# Patient Record
Sex: Female | Born: 1970
Health system: Southern US, Community
[De-identification: ages and names within clinical notes are randomized; demographics above are authoritative.]

## PROBLEM LIST (undated history)

## (undated) DIAGNOSIS — F32A Depression, unspecified: Secondary | ICD-10-CM

## (undated) DIAGNOSIS — F329 Major depressive disorder, single episode, unspecified: Secondary | ICD-10-CM

## (undated) DIAGNOSIS — I10 Essential (primary) hypertension: Secondary | ICD-10-CM

## (undated) DIAGNOSIS — J45909 Unspecified asthma, uncomplicated: Secondary | ICD-10-CM

## (undated) DIAGNOSIS — F419 Anxiety disorder, unspecified: Secondary | ICD-10-CM

## (undated) HISTORY — DX: Essential (primary) hypertension: I10

## (undated) HISTORY — DX: Major depressive disorder, single episode, unspecified: F32.9

## (undated) HISTORY — DX: Anxiety disorder, unspecified: F41.9

## (undated) HISTORY — DX: Depression, unspecified: F32.A

## (undated) HISTORY — DX: Unspecified asthma, uncomplicated: J45.909

## (undated) HISTORY — PX: TONSILLECTOMY: SUR1361

---

## 2005-02-11 DIAGNOSIS — O039 Complete or unspecified spontaneous abortion without complication: Secondary | ICD-10-CM | POA: Insufficient documentation

## 2006-01-08 ENCOUNTER — Emergency Department (HOSPITAL_COMMUNITY): Admission: EM | Admit: 2006-01-08 | Discharge: 2006-01-08 | Payer: Self-pay | Admitting: Emergency Medicine

## 2006-03-11 ENCOUNTER — Ambulatory Visit: Payer: Self-pay | Admitting: Family Medicine

## 2006-03-25 ENCOUNTER — Ambulatory Visit: Payer: Self-pay | Admitting: Family Medicine

## 2006-03-26 ENCOUNTER — Encounter: Payer: Self-pay | Admitting: Cardiology

## 2006-03-26 ENCOUNTER — Ambulatory Visit: Payer: Self-pay

## 2006-04-08 ENCOUNTER — Ambulatory Visit: Payer: Self-pay | Admitting: Family Medicine

## 2006-04-22 ENCOUNTER — Ambulatory Visit: Payer: Self-pay | Admitting: Family Medicine

## 2006-08-26 ENCOUNTER — Ambulatory Visit: Payer: Self-pay | Admitting: Family Medicine

## 2006-09-23 ENCOUNTER — Ambulatory Visit: Payer: Self-pay | Admitting: Family Medicine

## 2006-11-03 DIAGNOSIS — I10 Essential (primary) hypertension: Secondary | ICD-10-CM

## 2006-11-03 DIAGNOSIS — A389 Scarlet fever, uncomplicated: Secondary | ICD-10-CM | POA: Insufficient documentation

## 2006-11-03 DIAGNOSIS — F329 Major depressive disorder, single episode, unspecified: Secondary | ICD-10-CM

## 2006-12-09 ENCOUNTER — Ambulatory Visit: Payer: Self-pay | Admitting: Family Medicine

## 2006-12-09 ENCOUNTER — Encounter: Payer: Self-pay | Admitting: Family Medicine

## 2006-12-09 ENCOUNTER — Other Ambulatory Visit: Admission: RE | Admit: 2006-12-09 | Discharge: 2006-12-09 | Payer: Self-pay | Admitting: Family Medicine

## 2006-12-09 LAB — CONVERTED CEMR LAB
CO2: 25 meq/L (ref 19–32)
Calcium: 9.4 mg/dL (ref 8.4–10.5)
Chloride: 104 meq/L (ref 96–112)
Creatinine, Ser: 0.6 mg/dL (ref 0.4–1.2)
GFR calc Af Amer: 148 mL/min
GFR calc non Af Amer: 122 mL/min
Potassium: 3.6 meq/L (ref 3.5–5.1)
TSH: 2.35 microintl units/mL (ref 0.35–5.50)
Testosterone: 58.87 ng/dL (ref 10.00–70.0)

## 2006-12-16 ENCOUNTER — Encounter (INDEPENDENT_AMBULATORY_CARE_PROVIDER_SITE_OTHER): Payer: Self-pay | Admitting: *Deleted

## 2007-01-04 ENCOUNTER — Ambulatory Visit: Payer: Self-pay | Admitting: Family Medicine

## 2007-01-06 LAB — CONVERTED CEMR LAB
Glucose, 1 Hour GTT: 229 mg/dL — ABNORMAL HIGH (ref 120–170)
Glucose, 2 hour: 257 mg/dL — ABNORMAL HIGH (ref 70–139)

## 2007-01-08 ENCOUNTER — Ambulatory Visit: Payer: Self-pay | Admitting: Family Medicine

## 2007-01-08 DIAGNOSIS — E119 Type 2 diabetes mellitus without complications: Secondary | ICD-10-CM | POA: Insufficient documentation

## 2007-01-08 DIAGNOSIS — E669 Obesity, unspecified: Secondary | ICD-10-CM | POA: Insufficient documentation

## 2007-02-01 ENCOUNTER — Encounter: Admission: RE | Admit: 2007-02-01 | Discharge: 2007-05-02 | Payer: Self-pay | Admitting: Family Medicine

## 2007-02-01 ENCOUNTER — Encounter: Payer: Self-pay | Admitting: Family Medicine

## 2007-03-01 ENCOUNTER — Encounter: Payer: Self-pay | Admitting: Family Medicine

## 2007-04-29 ENCOUNTER — Ambulatory Visit: Payer: Self-pay | Admitting: Family Medicine

## 2007-05-03 LAB — CONVERTED CEMR LAB
ALT: 16 units/L (ref 0–35)
Albumin: 3.7 g/dL (ref 3.5–5.2)
Bilirubin, Direct: 0.1 mg/dL (ref 0.0–0.3)
CO2: 28 meq/L (ref 19–32)
Calcium: 9.2 mg/dL (ref 8.4–10.5)
Chloride: 107 meq/L (ref 96–112)
Creatinine, Ser: 0.4 mg/dL (ref 0.4–1.2)
GFR calc Af Amer: 236 mL/min
GFR calc non Af Amer: 195 mL/min
Potassium: 3.8 meq/L (ref 3.5–5.1)
Sodium: 140 meq/L (ref 135–145)
Total Bilirubin: 0.7 mg/dL (ref 0.3–1.2)
Total Protein: 7 g/dL (ref 6.0–8.3)

## 2007-05-29 ENCOUNTER — Ambulatory Visit: Payer: Self-pay | Admitting: Family Medicine

## 2007-05-29 DIAGNOSIS — S335XXA Sprain of ligaments of lumbar spine, initial encounter: Secondary | ICD-10-CM

## 2007-05-29 DIAGNOSIS — M546 Pain in thoracic spine: Secondary | ICD-10-CM | POA: Insufficient documentation

## 2007-05-31 ENCOUNTER — Ambulatory Visit: Payer: Self-pay | Admitting: Family Medicine

## 2007-06-01 ENCOUNTER — Encounter (INDEPENDENT_AMBULATORY_CARE_PROVIDER_SITE_OTHER): Payer: Self-pay | Admitting: *Deleted

## 2007-06-03 ENCOUNTER — Telehealth: Payer: Self-pay | Admitting: Family Medicine

## 2007-06-11 ENCOUNTER — Encounter: Admission: RE | Admit: 2007-06-11 | Discharge: 2007-06-11 | Payer: Self-pay | Admitting: Family Medicine

## 2007-06-30 ENCOUNTER — Encounter: Payer: Self-pay | Admitting: Family Medicine

## 2007-10-19 ENCOUNTER — Telehealth (INDEPENDENT_AMBULATORY_CARE_PROVIDER_SITE_OTHER): Payer: Self-pay | Admitting: *Deleted

## 2007-11-03 ENCOUNTER — Ambulatory Visit: Payer: Self-pay | Admitting: Family Medicine

## 2007-11-03 DIAGNOSIS — F411 Generalized anxiety disorder: Secondary | ICD-10-CM | POA: Insufficient documentation

## 2007-11-17 ENCOUNTER — Ambulatory Visit: Payer: Self-pay | Admitting: *Deleted

## 2007-11-24 ENCOUNTER — Ambulatory Visit: Payer: Self-pay | Admitting: *Deleted

## 2007-12-15 ENCOUNTER — Ambulatory Visit: Payer: Self-pay | Admitting: *Deleted

## 2007-12-22 ENCOUNTER — Encounter: Payer: Self-pay | Admitting: Family Medicine

## 2007-12-22 ENCOUNTER — Ambulatory Visit: Payer: Self-pay | Admitting: *Deleted

## 2008-01-12 ENCOUNTER — Ambulatory Visit: Payer: Self-pay | Admitting: *Deleted

## 2008-01-13 ENCOUNTER — Telehealth (INDEPENDENT_AMBULATORY_CARE_PROVIDER_SITE_OTHER): Payer: Self-pay | Admitting: *Deleted

## 2008-02-02 ENCOUNTER — Ambulatory Visit: Payer: Self-pay | Admitting: *Deleted

## 2008-02-09 ENCOUNTER — Ambulatory Visit: Payer: Self-pay | Admitting: *Deleted

## 2008-02-16 ENCOUNTER — Ambulatory Visit: Payer: Self-pay | Admitting: *Deleted

## 2008-02-23 ENCOUNTER — Telehealth: Payer: Self-pay | Admitting: Family Medicine

## 2008-02-23 ENCOUNTER — Ambulatory Visit: Payer: Self-pay | Admitting: *Deleted

## 2008-03-01 ENCOUNTER — Telehealth: Payer: Self-pay | Admitting: Family Medicine

## 2008-03-02 ENCOUNTER — Telehealth (INDEPENDENT_AMBULATORY_CARE_PROVIDER_SITE_OTHER): Payer: Self-pay | Admitting: *Deleted

## 2008-03-08 ENCOUNTER — Ambulatory Visit: Payer: Self-pay | Admitting: *Deleted

## 2008-03-22 ENCOUNTER — Ambulatory Visit: Payer: Self-pay | Admitting: *Deleted

## 2008-03-29 ENCOUNTER — Ambulatory Visit: Payer: Self-pay | Admitting: *Deleted

## 2008-04-05 ENCOUNTER — Ambulatory Visit: Payer: Self-pay | Admitting: *Deleted

## 2008-04-19 ENCOUNTER — Encounter: Payer: Self-pay | Admitting: Family Medicine

## 2008-04-27 ENCOUNTER — Encounter: Admission: RE | Admit: 2008-04-27 | Discharge: 2008-04-27 | Payer: Self-pay | Admitting: Neurosurgery

## 2008-04-28 ENCOUNTER — Ambulatory Visit: Payer: Self-pay | Admitting: *Deleted

## 2008-05-03 ENCOUNTER — Ambulatory Visit: Payer: Self-pay | Admitting: *Deleted

## 2008-05-10 ENCOUNTER — Ambulatory Visit: Payer: Self-pay | Admitting: *Deleted

## 2008-05-17 ENCOUNTER — Ambulatory Visit: Payer: Self-pay | Admitting: *Deleted

## 2008-05-18 ENCOUNTER — Encounter: Payer: Self-pay | Admitting: Family Medicine

## 2008-05-31 ENCOUNTER — Ambulatory Visit: Payer: Self-pay | Admitting: *Deleted

## 2008-06-06 ENCOUNTER — Telehealth: Payer: Self-pay | Admitting: Family Medicine

## 2008-06-07 ENCOUNTER — Ambulatory Visit: Payer: Self-pay | Admitting: *Deleted

## 2008-06-14 ENCOUNTER — Ambulatory Visit: Payer: Self-pay | Admitting: *Deleted

## 2008-06-21 ENCOUNTER — Ambulatory Visit: Payer: Self-pay | Admitting: *Deleted

## 2008-07-12 ENCOUNTER — Ambulatory Visit: Payer: Self-pay | Admitting: *Deleted

## 2008-07-19 ENCOUNTER — Ambulatory Visit: Payer: Self-pay | Admitting: *Deleted

## 2008-07-26 ENCOUNTER — Ambulatory Visit: Payer: Self-pay | Admitting: *Deleted

## 2008-08-02 ENCOUNTER — Ambulatory Visit: Payer: Self-pay | Admitting: *Deleted

## 2008-08-08 ENCOUNTER — Emergency Department (HOSPITAL_COMMUNITY): Admission: EM | Admit: 2008-08-08 | Discharge: 2008-08-09 | Payer: Self-pay | Admitting: Emergency Medicine

## 2008-08-16 ENCOUNTER — Ambulatory Visit: Payer: Self-pay | Admitting: *Deleted

## 2008-08-30 ENCOUNTER — Ambulatory Visit: Payer: Self-pay | Admitting: *Deleted

## 2008-09-04 ENCOUNTER — Telehealth: Payer: Self-pay | Admitting: Family Medicine

## 2008-09-06 ENCOUNTER — Ambulatory Visit: Payer: Self-pay | Admitting: *Deleted

## 2008-09-08 ENCOUNTER — Ambulatory Visit: Payer: Self-pay | Admitting: Family Medicine

## 2008-09-08 DIAGNOSIS — R5383 Other fatigue: Secondary | ICD-10-CM

## 2008-09-08 DIAGNOSIS — R5381 Other malaise: Secondary | ICD-10-CM

## 2008-09-08 DIAGNOSIS — R011 Cardiac murmur, unspecified: Secondary | ICD-10-CM

## 2008-09-08 LAB — CONVERTED CEMR LAB
Bilirubin Urine: NEGATIVE
Glucose, Urine, Semiquant: NEGATIVE
Urobilinogen, UA: NEGATIVE

## 2008-09-09 LAB — CONVERTED CEMR LAB
BUN: 9 mg/dL (ref 6–23)
CO2: 25 meq/L (ref 19–32)
Cholesterol: 200 mg/dL (ref 0–200)
Folate: 12.5 ng/mL
GFR calc Af Amer: 146 mL/min
Glucose, Bld: 168 mg/dL — ABNORMAL HIGH (ref 70–99)
HDL: 34.4 mg/dL — ABNORMAL LOW (ref >39.0)
Microalb, Ur: 1.2 mg/dL (ref 0.0–1.9)
Potassium: 3.7 meq/L (ref 3.5–5.1)
Sodium: 135 meq/L (ref 135–145)
Total CHOL/HDL Ratio: 5.8
Total Protein: 7.4 g/dL (ref 6.0–8.3)
VLDL: 13 mg/dL (ref 0–40)
Vitamin B-12: 317 pg/mL (ref 211–911)

## 2008-09-11 ENCOUNTER — Telehealth: Payer: Self-pay | Admitting: Family Medicine

## 2008-09-18 ENCOUNTER — Encounter: Payer: Self-pay | Admitting: Family Medicine

## 2008-09-18 ENCOUNTER — Other Ambulatory Visit: Admission: RE | Admit: 2008-09-18 | Discharge: 2008-09-18 | Payer: Self-pay | Admitting: Family Medicine

## 2008-09-18 ENCOUNTER — Ambulatory Visit: Payer: Self-pay | Admitting: Family Medicine

## 2008-09-18 DIAGNOSIS — N760 Acute vaginitis: Secondary | ICD-10-CM | POA: Insufficient documentation

## 2008-09-18 LAB — HM PAP SMEAR

## 2008-09-19 ENCOUNTER — Telehealth (INDEPENDENT_AMBULATORY_CARE_PROVIDER_SITE_OTHER): Payer: Self-pay | Admitting: *Deleted

## 2008-09-19 ENCOUNTER — Encounter: Payer: Self-pay | Admitting: Family Medicine

## 2008-09-19 LAB — CONVERTED CEMR LAB
Glucose, Bld: 130 mg/dL — ABNORMAL HIGH (ref 70–99)
Hgb A1c MFr Bld: 7.7 % — ABNORMAL HIGH (ref 4.6–6.0)

## 2008-09-20 ENCOUNTER — Ambulatory Visit: Payer: Self-pay | Admitting: *Deleted

## 2008-09-20 ENCOUNTER — Telehealth (INDEPENDENT_AMBULATORY_CARE_PROVIDER_SITE_OTHER): Payer: Self-pay | Admitting: *Deleted

## 2008-09-21 ENCOUNTER — Encounter (INDEPENDENT_AMBULATORY_CARE_PROVIDER_SITE_OTHER): Payer: Self-pay | Admitting: *Deleted

## 2008-09-26 ENCOUNTER — Ambulatory Visit: Payer: Self-pay

## 2008-09-26 ENCOUNTER — Encounter: Payer: Self-pay | Admitting: Family Medicine

## 2008-09-27 ENCOUNTER — Ambulatory Visit: Payer: Self-pay | Admitting: *Deleted

## 2008-09-27 ENCOUNTER — Telehealth (INDEPENDENT_AMBULATORY_CARE_PROVIDER_SITE_OTHER): Payer: Self-pay | Admitting: *Deleted

## 2008-09-28 ENCOUNTER — Encounter: Payer: Self-pay | Admitting: Family Medicine

## 2008-10-02 ENCOUNTER — Telehealth (INDEPENDENT_AMBULATORY_CARE_PROVIDER_SITE_OTHER): Payer: Self-pay | Admitting: *Deleted

## 2008-10-11 ENCOUNTER — Ambulatory Visit: Payer: Self-pay | Admitting: *Deleted

## 2008-10-11 ENCOUNTER — Ambulatory Visit: Payer: Self-pay | Admitting: Family Medicine

## 2008-10-11 LAB — CONVERTED CEMR LAB

## 2008-10-23 ENCOUNTER — Telehealth (INDEPENDENT_AMBULATORY_CARE_PROVIDER_SITE_OTHER): Payer: Self-pay | Admitting: *Deleted

## 2008-10-25 ENCOUNTER — Ambulatory Visit: Payer: Self-pay | Admitting: *Deleted

## 2008-10-30 ENCOUNTER — Ambulatory Visit: Payer: Self-pay | Admitting: *Deleted

## 2008-11-08 ENCOUNTER — Ambulatory Visit: Payer: Self-pay | Admitting: *Deleted

## 2008-11-15 ENCOUNTER — Ambulatory Visit: Payer: Self-pay | Admitting: *Deleted

## 2008-11-22 ENCOUNTER — Telehealth (INDEPENDENT_AMBULATORY_CARE_PROVIDER_SITE_OTHER): Payer: Self-pay | Admitting: *Deleted

## 2008-11-22 ENCOUNTER — Ambulatory Visit: Payer: Self-pay | Admitting: *Deleted

## 2008-11-22 ENCOUNTER — Ambulatory Visit: Payer: Self-pay | Admitting: Family Medicine

## 2008-11-24 ENCOUNTER — Encounter: Admission: RE | Admit: 2008-11-24 | Discharge: 2008-11-24 | Payer: Self-pay | Admitting: Family Medicine

## 2008-11-24 ENCOUNTER — Ambulatory Visit: Payer: Self-pay | Admitting: Family Medicine

## 2008-11-24 DIAGNOSIS — D259 Leiomyoma of uterus, unspecified: Secondary | ICD-10-CM | POA: Insufficient documentation

## 2008-11-27 ENCOUNTER — Encounter (INDEPENDENT_AMBULATORY_CARE_PROVIDER_SITE_OTHER): Payer: Self-pay | Admitting: *Deleted

## 2008-11-29 ENCOUNTER — Ambulatory Visit: Payer: Self-pay | Admitting: *Deleted

## 2008-12-06 ENCOUNTER — Ambulatory Visit: Payer: Self-pay | Admitting: *Deleted

## 2008-12-13 ENCOUNTER — Ambulatory Visit: Payer: Self-pay | Admitting: *Deleted

## 2008-12-14 ENCOUNTER — Encounter: Payer: Self-pay | Admitting: Family Medicine

## 2008-12-20 ENCOUNTER — Ambulatory Visit: Payer: Self-pay | Admitting: *Deleted

## 2008-12-27 ENCOUNTER — Ambulatory Visit: Payer: Self-pay | Admitting: *Deleted

## 2008-12-29 ENCOUNTER — Encounter: Payer: Self-pay | Admitting: Family Medicine

## 2009-01-17 ENCOUNTER — Ambulatory Visit: Payer: Self-pay | Admitting: *Deleted

## 2009-01-31 ENCOUNTER — Ambulatory Visit: Payer: Self-pay | Admitting: *Deleted

## 2009-01-31 ENCOUNTER — Telehealth (INDEPENDENT_AMBULATORY_CARE_PROVIDER_SITE_OTHER): Payer: Self-pay | Admitting: *Deleted

## 2009-02-01 ENCOUNTER — Telehealth (INDEPENDENT_AMBULATORY_CARE_PROVIDER_SITE_OTHER): Payer: Self-pay | Admitting: *Deleted

## 2009-02-07 ENCOUNTER — Ambulatory Visit: Payer: Self-pay | Admitting: *Deleted

## 2009-02-08 ENCOUNTER — Ambulatory Visit: Payer: Self-pay | Admitting: Family Medicine

## 2009-02-08 DIAGNOSIS — J019 Acute sinusitis, unspecified: Secondary | ICD-10-CM

## 2009-02-12 ENCOUNTER — Telehealth (INDEPENDENT_AMBULATORY_CARE_PROVIDER_SITE_OTHER): Payer: Self-pay | Admitting: *Deleted

## 2009-02-14 ENCOUNTER — Ambulatory Visit: Payer: Self-pay | Admitting: *Deleted

## 2009-02-23 ENCOUNTER — Encounter: Payer: Self-pay | Admitting: Family Medicine

## 2009-02-23 ENCOUNTER — Ambulatory Visit: Payer: Self-pay | Admitting: *Deleted

## 2009-02-28 ENCOUNTER — Ambulatory Visit: Payer: Self-pay | Admitting: *Deleted

## 2009-03-07 ENCOUNTER — Ambulatory Visit: Payer: Self-pay | Admitting: *Deleted

## 2009-03-14 ENCOUNTER — Ambulatory Visit: Payer: Self-pay | Admitting: *Deleted

## 2009-03-21 ENCOUNTER — Ambulatory Visit: Payer: Self-pay | Admitting: *Deleted

## 2009-03-28 ENCOUNTER — Ambulatory Visit: Payer: Self-pay | Admitting: *Deleted

## 2009-04-11 ENCOUNTER — Ambulatory Visit: Payer: Self-pay | Admitting: *Deleted

## 2009-04-18 ENCOUNTER — Ambulatory Visit: Payer: Self-pay | Admitting: *Deleted

## 2009-04-25 ENCOUNTER — Ambulatory Visit: Payer: Self-pay | Admitting: *Deleted

## 2009-05-02 ENCOUNTER — Ambulatory Visit: Payer: Self-pay | Admitting: *Deleted

## 2009-05-09 ENCOUNTER — Ambulatory Visit: Payer: Self-pay | Admitting: *Deleted

## 2009-05-10 ENCOUNTER — Ambulatory Visit: Payer: Self-pay | Admitting: Family Medicine

## 2009-05-10 DIAGNOSIS — L910 Hypertrophic scar: Secondary | ICD-10-CM

## 2009-05-23 ENCOUNTER — Ambulatory Visit: Payer: Self-pay | Admitting: *Deleted

## 2009-06-12 ENCOUNTER — Telehealth (INDEPENDENT_AMBULATORY_CARE_PROVIDER_SITE_OTHER): Payer: Self-pay | Admitting: *Deleted

## 2009-06-27 ENCOUNTER — Ambulatory Visit: Payer: Self-pay | Admitting: *Deleted

## 2009-08-16 ENCOUNTER — Encounter (INDEPENDENT_AMBULATORY_CARE_PROVIDER_SITE_OTHER): Payer: Self-pay | Admitting: *Deleted

## 2009-08-23 ENCOUNTER — Ambulatory Visit: Payer: Self-pay | Admitting: Family Medicine

## 2009-08-23 DIAGNOSIS — J329 Chronic sinusitis, unspecified: Secondary | ICD-10-CM | POA: Insufficient documentation

## 2009-08-28 ENCOUNTER — Encounter: Payer: Self-pay | Admitting: Family Medicine

## 2009-09-27 ENCOUNTER — Encounter: Payer: Self-pay | Admitting: Family Medicine

## 2009-10-16 ENCOUNTER — Ambulatory Visit: Payer: Self-pay | Admitting: Family Medicine

## 2009-10-16 LAB — HM DIABETES FOOT EXAM

## 2009-10-17 LAB — CONVERTED CEMR LAB
ALT: 29 units/L (ref 0–35)
Basophils Absolute: 0.1 10*3/uL (ref 0.0–0.1)
Basophils Relative: 1 % (ref 0.0–3.0)
Bilirubin, Direct: 0 mg/dL (ref 0.0–0.3)
CO2: 26 meq/L (ref 19–32)
Chloride: 103 meq/L (ref 96–112)
Cholesterol: 131 mg/dL (ref 0–200)
Eosinophils Relative: 2.4 % (ref 0.0–5.0)
Free T4: 0.8 ng/dL (ref 0.6–1.6)
GFR calc non Af Amer: 145.22 mL/min (ref >60)
Glucose, Bld: 122 mg/dL — ABNORMAL HIGH (ref 70–99)
HDL: 42 mg/dL (ref >39.00)
Hemoglobin: 12.8 g/dL (ref 12.0–15.0)
Hgb A1c MFr Bld: 6.7 % — ABNORMAL HIGH (ref 4.6–6.5)
LDL Cholesterol: 72 mg/dL (ref 0–99)
Lymphocytes Relative: 48.4 % — ABNORMAL HIGH (ref 12.0–46.0)
Neutro Abs: 2.1 10*3/uL (ref 1.4–7.7)
Platelets: 264 10*3/uL (ref 150.0–400.0)
Potassium: 3.3 meq/L — ABNORMAL LOW (ref 3.5–5.1)
RBC: 4.27 M/uL (ref 3.87–5.11)
RDW: 14.4 % (ref 11.5–14.6)
T3, Free: 3.5 pg/mL (ref 2.3–4.2)
TSH: 1.85 microintl units/mL (ref 0.35–5.50)
Total CHOL/HDL Ratio: 3
Total Protein: 7.3 g/dL (ref 6.0–8.3)
VLDL: 17.2 mg/dL (ref 0.0–40.0)
WBC: 5.3 10*3/uL (ref 4.5–10.5)

## 2009-11-17 ENCOUNTER — Ambulatory Visit: Payer: Self-pay | Admitting: Family Medicine

## 2009-11-17 LAB — CONVERTED CEMR LAB
Bilirubin Urine: NEGATIVE
Blood in Urine, dipstick: NEGATIVE
Ketones, urine, test strip: NEGATIVE
WBC Urine, dipstick: NEGATIVE

## 2009-11-21 ENCOUNTER — Ambulatory Visit: Payer: Self-pay | Admitting: *Deleted

## 2009-11-28 ENCOUNTER — Ambulatory Visit: Payer: Self-pay | Admitting: *Deleted

## 2009-12-05 ENCOUNTER — Ambulatory Visit: Payer: Self-pay | Admitting: *Deleted

## 2009-12-12 ENCOUNTER — Ambulatory Visit: Payer: Self-pay | Admitting: *Deleted

## 2009-12-31 ENCOUNTER — Telehealth (INDEPENDENT_AMBULATORY_CARE_PROVIDER_SITE_OTHER): Payer: Self-pay | Admitting: *Deleted

## 2010-01-25 ENCOUNTER — Ambulatory Visit: Payer: Self-pay | Admitting: *Deleted

## 2010-01-29 ENCOUNTER — Ambulatory Visit: Payer: Self-pay | Admitting: Family Medicine

## 2010-01-29 DIAGNOSIS — B3731 Acute candidiasis of vulva and vagina: Secondary | ICD-10-CM | POA: Insufficient documentation

## 2010-01-29 DIAGNOSIS — B373 Candidiasis of vulva and vagina: Secondary | ICD-10-CM

## 2010-01-29 LAB — CONVERTED CEMR LAB
Glucose, Urine, Semiquant: NEGATIVE
Nitrite: NEGATIVE
Protein, U semiquant: NEGATIVE

## 2010-01-30 ENCOUNTER — Ambulatory Visit: Payer: Self-pay | Admitting: *Deleted

## 2010-01-30 ENCOUNTER — Encounter: Payer: Self-pay | Admitting: Family Medicine

## 2010-01-30 LAB — CONVERTED CEMR LAB
Candida species: POSITIVE — AB
Chlamydia, Swab/Urine, PCR: NEGATIVE
GC Probe Amp, Urine: NEGATIVE

## 2010-02-01 ENCOUNTER — Telehealth: Payer: Self-pay | Admitting: Family Medicine

## 2010-02-06 ENCOUNTER — Ambulatory Visit: Payer: Self-pay | Admitting: *Deleted

## 2010-02-06 ENCOUNTER — Telehealth: Payer: Self-pay | Admitting: Family Medicine

## 2010-02-14 ENCOUNTER — Ambulatory Visit: Payer: Self-pay | Admitting: Family Medicine

## 2010-02-14 LAB — CONVERTED CEMR LAB
Bilirubin Urine: NEGATIVE
Glucose, Urine, Semiquant: >=1000
Ketones, urine, test strip: NEGATIVE
Nitrite: NEGATIVE
WBC Urine, dipstick: NEGATIVE
pH: 5

## 2010-02-20 ENCOUNTER — Ambulatory Visit: Payer: Self-pay | Admitting: *Deleted

## 2010-03-01 ENCOUNTER — Ambulatory Visit: Payer: Self-pay | Admitting: *Deleted

## 2010-03-06 ENCOUNTER — Ambulatory Visit: Payer: Self-pay | Admitting: *Deleted

## 2010-03-13 ENCOUNTER — Ambulatory Visit: Payer: Self-pay | Admitting: *Deleted

## 2010-03-22 ENCOUNTER — Ambulatory Visit: Payer: Self-pay | Admitting: Family Medicine

## 2010-03-22 ENCOUNTER — Ambulatory Visit: Payer: Self-pay | Admitting: *Deleted

## 2010-03-29 ENCOUNTER — Ambulatory Visit: Payer: Self-pay | Admitting: *Deleted

## 2010-04-05 ENCOUNTER — Ambulatory Visit: Payer: Self-pay | Admitting: *Deleted

## 2010-04-05 ENCOUNTER — Ambulatory Visit: Payer: Self-pay | Admitting: Family Medicine

## 2010-04-12 ENCOUNTER — Ambulatory Visit: Payer: Self-pay | Admitting: *Deleted

## 2010-07-02 ENCOUNTER — Ambulatory Visit: Payer: Self-pay | Admitting: Family Medicine

## 2010-07-22 ENCOUNTER — Encounter: Payer: Self-pay | Admitting: Family Medicine

## 2010-07-24 ENCOUNTER — Telehealth (INDEPENDENT_AMBULATORY_CARE_PROVIDER_SITE_OTHER): Payer: Self-pay | Admitting: *Deleted

## 2010-08-13 NOTE — Progress Notes (Signed)
Summary: REFILL REQUEST  Phone Note Refill Request Call back at [phone] Message from:  Pharmacy on December 31, 2009 4:38 PM  Refills Requested: Medication #1:  GLUCOPHAGE 1000 MG TABS Take one tablet twice daily.   Dosage confirmed as above?Dosage Confirmed   Supply Requested: 3 months  Medication #2:  LISINOPRIL-HYDROCHLOROTHIAZIDE 10-12.5 MG  TABS TAKE ONE TABLET DAILY- OFFICE VISIT AND LABS DUE NOW   Dosage confirmed as above?Dosage Confirmed   Supply Requested: 3 months  Medication #3:  CELEXA 40 MG  TABS 1 by mouth once daily   Dosage confirmed as above?Dosage Confirmed   Supply Requested: 3 months  Medication #4:  PRAVACHOL 40 MG TABS take 1 by mouth  at bedtime   Dosage confirmed as above?Dosage Confirmed   Supply Requested: 3 months [facility]  Next Appointment Scheduled: NONE Initial call taken by: Lavell Islam,  December 31, 2009 4:40 PM    Prescriptions: GLUCOPHAGE 1000 MG TABS (METFORMIN HCL) Take one tablet twice daily.  #90 x 1   Entered by:   Army Fossa CMA   Authorized by:   Loreen Freud DO   Signed by:   Army Fossa CMA on 01/01/2010   Method used:   Faxed to ...       [facility] education [facility])       [address] [address]       [place], [place]  [address]       Ph: [phone]       Fax: [phone]   RxID:   [id number] PRAVACHOL 40 MG TABS (PRAVASTATIN SODIUM) take 1 by mouth  at bedtime  #90 x 1   Entered by:   Army Fossa CMA   Authorized by:   Loreen Freud DO   Signed by:   Army Fossa CMA on 01/01/2010   Method used:   Faxed to ...       [facility] education [facility])       [address] [address]       [place], [place]  [address]       Ph: [phone]       Fax: [phone]   RxID:   [id number] LISINOPRIL-HYDROCHLOROTHIAZIDE 10-12.5 MG  TABS (LISINOPRIL-HYDROCHLOROTHIAZIDE) TAKE ONE TABLET DAILY- OFFICE VISIT AND LABS DUE NOW  #90 x 1   Entered by:   Army Fossa CMA   Authorized by:   Loreen Freud DO   Signed by:   Army Fossa  CMA on 01/01/2010   Method used:   Faxed to ...       [facility] education officer)       [address] [address]       [place], [place]  [address]       Ph: [phone]       Fax: [phone]   RxID:   [id number] CELEXA 40 MG  TABS (CITALOPRAM HYDROBROMIDE) 1 by mouth once daily  #90 x 2   Entered by:   Army Fossa CMA   Authorized by:   Loreen Freud DO   Signed by:   Army Fossa CMA on 01/01/2010   Method used:   Faxed to ...       [facility] education officer)       [address] [address]       [place], [place]  [address]       Ph: [phone]       Fax: [phone]   RxID:   [id number]

## 2010-08-13 NOTE — Letter (Signed)
Summary: Primary Care Appointment Letter  [facility]  [address] [place], [place] [address]   Phone: [phone]  Fax: [phone]    08/16/2009 MRN: [id number]  [facility] [address] [place], [place]  [address]  Dear Ms. Dahlia Byes,   Your Primary Care Physician Loreen Freud DO has indicated that:    ____X___it is time to schedule an appointment.    _______you missed your appointment on______ and need to call and          reschedule.    _______you need to have lab work done.    _______you need to schedule an appointment discuss lab or test results.    _______you need to call to reschedule your appointment that is                       scheduled on _________.     Please call our office as soon as possible. Our phone number is [phone]          _________. Please press option 1. Our office is open 8a-12noon and 1p-5p, Monday through Friday.     Thank you,    [employer] Primary Care Scheduler

## 2010-08-13 NOTE — Progress Notes (Signed)
Summary: Refill for Yeast Infection  Phone Note Refill Request Call back at Home Phone [phone] Message from:  Patient on February 01, 2010 4:10 PM  Refills Requested: Medication #1:  FLUCONAZOLE 150 MG TABS 1 by mouth once daily x1   Dosage confirmed as above?Dosage Confirmed   Supply Requested: 1 month Yeast is not fully gone yet.   Initial call taken by: Harold Barban,  February 01, 2010 4:11 PM Caller: Patient Summary of Call: Patient would like a refill on difulcan, yeast is not fully gone yet.  Initial call taken by: Harold Barban,  February 01, 2010 4:11 PM  Follow-up for Phone Call        [facility]ISE..........Marland KitchenFelecia Deloach CMA  February 01, 2010 4:31 PM   Additional Follow-up for Phone Call Additional follow up Details #1::        diflucan 150mg   #4 1 weekly for 6months  5 refills Additional Follow-up by: Loreen Freud DO,  February 01, 2010 4:41 PM    Additional Follow-up for Phone Call Additional follow up Details #2::    left pt detail message rx sent to pharmacy and to call with any further concerns...............Marland KitchenFelecia Deloach CMA  February 01, 2010 4:52 PM   New/Updated Medications: FLUCONAZOLE 150 MG TABS (FLUCONAZOLE) Take 1tab  weekly for 6months Prescriptions: FLUCONAZOLE 150 MG TABS (FLUCONAZOLE) Take 1tab  weekly for 6months  #4 x 5   Entered by:   Jeremy Johann CMA   Authorized by:   Loreen Freud DO   Signed by:   Jeremy Johann CMA on 02/01/2010   Method used:   Faxed to ...       [facility]. [phone]* (retail)       [address]       [place], [place]  [address]       Ph: [phone]       Fax: [phone]   RxID:   [id number]

## 2010-08-13 NOTE — Assessment & Plan Note (Signed)
Summary: FOLLOWUP/KN   Vital Signs:  Patient profile:   40 year old female Weight:      237.2 pounds Temp:     98.2 degrees F oral Pulse rate:   88 / minute Pulse rhythm:   regular BP sitting:   124 / 82  (left arm)  Vitals Entered By: Almeta Monas CMA Duncan Dull) (April 05, 2010 2:21 PM) CC: 2 week f/u, no complaints   History of Present Illness: Pt here f/u anxiety--pt is doingmuch better.  No new complaints.  She is in marriage counseling with husband.   Current Medications (verified): 1)  Celexa 40 Mg  Tabs (Citalopram Hydrobromide) .Marland Kitchen.. 1 By Mouth Once Daily 2)  Lisinopril-Hydrochlorothiazide 10-12.5 Mg  Tabs (Lisinopril-Hydrochlorothiazide) .... Take One Tablet Daily- Office Visit and Labs Due Now 3)  Mirena 20 Mcg/24hr Iud (Levonorgestrel) .... Iud 4)  Freestyle Lite   Strp (Glucose Blood) .... Accu Check Two Times A Day 5)  Freestyle Lancets   Misc (Lancets) .... As Directed 6)  Ativan 0.5 Mg  Tabs (Lorazepam) .Marland Kitchen.. 1 By Mouth Three Times A Day As Needed 7)  Pravachol 40 Mg Tabs (Pravastatin Sodium) .... Take 1 By Mouth  At Bedtime 8)  Glucophage 1000 Mg Tabs (Metformin Hcl) .... Take One Tablet Twice Daily. 9)  Onetouch Lancets  Misc (Lancets) .... Pt Test 2 Times A Day 10)  Onetouch Lancets  Misc (Lancets) .... Pt Test 2 Times A Day 11)  Onetouch Ultra Test  Strp (Glucose Blood) .... Pt Test 2 Times A Day 12)  Ketoprofen .... As Needed 13)  Nasonex 50 Mcg/act Susp (Mometasone Furoate) .... 2 Sprays Each Nostril Once Daily 14)  Klor-Con 20 Meq Pack (Potassium Chloride) .Marland Kitchen.. 1 By Mouth Once Daily 15)  Fluconazole 150 Mg Tabs (Fluconazole) .... Take 1tab  Weekly For 6months 16)  Cipro 500 Mg Tabs (Ciprofloxacin Hcl) .... Take 1 By Mouth Two Times A Day For 5 Days 17)  Klonopin 0.5 Mg Tabs (Clonazepam) .Marland Kitchen.. 1 By Mouth Three Times A Day  Allergies (verified): No Known Drug Allergies  Past History:  Past medical, surgical, family and social histories (including risk  factors) reviewed for relevance to current acute and chronic problems.  Past Medical History: Reviewed history from 02/08/2009 and no changes required. Depression Hypertension Diabetes mellitus, type II (12/2006) Current Problems:  ACUTE SINUSITIS, UNSPECIFIED (ICD-461.9) FIBROIDS, UTERUS (ICD-218.9) BACTERIAL VAGINITIS (ICD-616.10) ROUTINE GYNECOLOGICAL EXAMINATION (ICD-V72.31) IUD (ICD-V25.1) FATIGUE (ICD-780.79) CARDIAC MURMUR (ICD-785.2) PREVENTIVE HEALTH CARE (ICD-V70.0) FAMILY HISTORY DEPRESSION (ICD-V17.0) FAMILY HISTORY DIABETES 1ST DEGREE RELATIVE (ICD-V18.0) ANXIETY STATE, UNSPECIFIED (ICD-300.00) BACK PAIN, THORACIC REGION (ICD-724.1) BACK STRAIN, LUMBAR (ICD-847.2) OBESITY NOS (ICD-278.00) DM, UNCOMPLICATED, TYPE II (ICD-250.00) SCARLET FEVER (ICD-034.1) ABORTION (ICD-637.90) HYPERTENSION (ICD-401.9) DEPRESSION (ICD-311)  Past Surgical History: Reviewed history from 10/16/2009 and no changes required. Tonsillectomy mirena 2010  Family History: Reviewed history from 09/08/2008 and no changes required. Family History Diabetes 1st degree relative Family History Hypertension Family History Depression\par MGF---colon cancer 38yo  Social History: Reviewed history from 10/16/2009 and no changes required. 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     Impression & Recommendations:  Problem # 1:  ANXIETY STATE, UNSPECIFIED (ICD-300.00)  Her updated medication list for this problem includes:    Celexa 40 Mg Tabs (  Citalopram hydrobromide) .Marland Kitchen... 1 by mouth once daily    Ativan 0.5 Mg Tabs (Lorazepam) .Marland Kitchen... 1 by mouth three times a day as needed    Klonopin 0.5 Mg Tabs  (Clonazepam) .Marland Kitchen... 1 by mouth three times a day  Discussed medication use and relaxation techniques.   Complete Medication List: 1)  Celexa 40 Mg Tabs (Citalopram hydrobromide) .Marland Kitchen.. 1 by mouth once daily 2)  Lisinopril-hydrochlorothiazide 10-12.5 Mg Tabs (Lisinopril-hydrochlorothiazide) .... Take one tablet daily- office visit and labs due now 3)  Mirena 20 Mcg/24hr Iud (Levonorgestrel) .... Iud 4)  Freestyle Lite Strp (Glucose blood) .... Accu check two times a day 5)  Freestyle Lancets Misc (Lancets) .... As directed 6)  Ativan 0.5 Mg Tabs (Lorazepam) .Marland Kitchen.. 1 by mouth three times a day as needed 7)  Pravachol 40 Mg Tabs (Pravastatin sodium) .... Take 1 by mouth  at bedtime 8)  Glucophage 1000 Mg Tabs (Metformin hcl) .... Take one tablet twice daily. 9)  Onetouch Lancets Misc (Lancets) .... Pt test 2 times a day 10)  Onetouch Lancets Misc (Lancets) .... Pt test 2 times a day 11)  Onetouch Ultra Test Strp (Glucose blood) .... Pt test 2 times a day 12)  Ketoprofen  .... As needed 13)  Nasonex 50 Mcg/act Susp (Mometasone furoate) .... 2 sprays each nostril once daily 14)  Klor-con 20 Meq Pack (Potassium chloride) .Marland Kitchen.. 1 by mouth once daily 15)  Fluconazole 150 Mg Tabs (Fluconazole) .... Take 1tab  weekly for 6months 16)  Cipro 500 Mg Tabs (Ciprofloxacin hcl) .... Take 1 by mouth two times a day for 5 days 17)  Klonopin 0.5 Mg Tabs (Clonazepam) .Marland Kitchen.. 1 by mouth three times a day  Other Orders: Admin 1st Vaccine ([address]) Flu Vaccine 86yrs + (60454) Flu Vaccine Consent Questions     Do you have a history of severe allergic reactions to this vaccine? no    Any prior history of allergic reactions to egg and/or gelatin? no    Do you have a sensitivity to the preservative Thimersol? no    Do you have a past history of Guillan-Barre Syndrome? no    Do you currently have an acute febrile illness? no    Have you ever had a severe reaction to latex? no    Vaccine information given and explained to  patient? yes    Are you currently pregnant? no    Lot Number:[id number]   Exp Date:01/11/2011   Site Given  Left Deltoid IM Flu Vaccine 22yrs + (09811)  Patient Instructions: 1)  Please schedule a follow-up appointment in 3 months .  Prescriptions: KLONOPIN 0.5 MG TABS (CLONAZEPAM) 1 by mouth three times a day  #90 x 0   Entered and Authorized by:   Loreen Freud DO   Signed by:   Loreen Freud DO on 04/05/2010   Method used:   Historical   RxID:   [id number]     .lbflu

## 2010-08-13 NOTE — Assessment & Plan Note (Signed)
Summary: WALK IN/ SAW MRS TROXLER/ NEEDS EMERGENCY ANXIETY PILL/KN   Vital Signs:  Patient profile:   40 year old female Weight:      232.2 pounds Temp:     99.2 degrees F oral Pulse rate:   88 / minute Pulse rhythm:   regular BP sitting:   124 / 70  (right arm)  Vitals Entered By: Almeta Monas CMA Duncan Dull) (March 22, 2010 1:26 PM) CC: wants to discuss anxiety and stress   History of Present Illness: Pt walked in after seeing K Troxler for emergency session --she is having marital issues and came to the end of her rope.  Pt needs meds adjusted.  Pt cut herself last night.  Cut is superficial and pt denies being suicidal.  Current Medications (verified): 1)  Celexa 40 Mg  Tabs (Citalopram Hydrobromide) .Marland Kitchen.. 1 By Mouth Once Daily 2)  Lisinopril-Hydrochlorothiazide 10-12.5 Mg  Tabs (Lisinopril-Hydrochlorothiazide) .... Take One Tablet Daily- Office Visit and Labs Due Now 3)  Mirena 20 Mcg/24hr Iud (Levonorgestrel) .... Iud 4)  Freestyle Lite   Strp (Glucose Blood) .... Accu Check Two Times A Day 5)  Freestyle Lancets   Misc (Lancets) .... As Directed 6)  Ativan 0.5 Mg  Tabs (Lorazepam) .Marland Kitchen.. 1 By Mouth Three Times A Day As Needed 7)  Pravachol 40 Mg Tabs (Pravastatin Sodium) .... Take 1 By Mouth  At Bedtime 8)  Glucophage 1000 Mg Tabs (Metformin Hcl) .... Take One Tablet Twice Daily. 9)  Onetouch Lancets  Misc (Lancets) .... Pt Test 2 Times A Day 10)  Onetouch Lancets  Misc (Lancets) .... Pt Test 2 Times A Day 11)  Onetouch Ultra Test  Strp (Glucose Blood) .... Pt Test 2 Times A Day 12)  Ketoprofen .... As Needed 13)  Nasonex 50 Mcg/act Susp (Mometasone Furoate) .... 2 Sprays Each Nostril Once Daily 14)  Klor-Con 20 Meq Pack (Potassium Chloride) .Marland Kitchen.. 1 By Mouth Once Daily 15)  Fluconazole 150 Mg Tabs (Fluconazole) .... Take 1tab  Weekly For 6months 16)  Cipro 500 Mg Tabs (Ciprofloxacin Hcl) .... Take 1 By Mouth Two Times A Day For 5 Days 17)  Klonopin 1 Mg Tabs (Clonazepam) .Marland Kitchen.. 1  By Mouth Three Times A Day  Allergies (verified): No Known Drug Allergies  Past History:  Past medical, surgical, family and social histories (including risk factors) reviewed for relevance to current acute and chronic problems.  Past Medical History: Reviewed history from 02/08/2009 and no changes required. Depression Hypertension Diabetes mellitus, type II (12/2006) Current Problems:  ACUTE SINUSITIS, UNSPECIFIED (ICD-461.9) FIBROIDS, UTERUS (ICD-218.9) BACTERIAL VAGINITIS (ICD-616.10) ROUTINE GYNECOLOGICAL EXAMINATION (ICD-V72.31) IUD (ICD-V25.1) FATIGUE (ICD-780.79) CARDIAC MURMUR (ICD-785.2) PREVENTIVE HEALTH CARE (ICD-V70.0) FAMILY HISTORY DEPRESSION (ICD-V17.0) FAMILY HISTORY DIABETES 1ST DEGREE RELATIVE (ICD-V18.0) ANXIETY STATE, UNSPECIFIED (ICD-300.00) BACK PAIN, THORACIC REGION (ICD-724.1) BACK STRAIN, LUMBAR (ICD-847.2) OBESITY NOS (ICD-278.00) DM, UNCOMPLICATED, TYPE II (ICD-250.00) SCARLET FEVER (ICD-034.1) ABORTION (ICD-637.90) HYPERTENSION (ICD-401.9) DEPRESSION (ICD-311)  Past Surgical History: Reviewed history from 10/16/2009 and no changes required. Tonsillectomy mirena 2010  Family History: Reviewed history from 09/08/2008 and no changes required. Family History Diabetes 1st degree relative Family History Hypertension Family History Depression\par MGF---colon cancer 38yo  Social History: Reviewed history from 10/16/2009 and no changes required. 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     Impression & Recommendations:  Problem # 1:  ANXIETY STATE, UNSPECIFIED (ICD-300.00) Pt  here > 30 min----> 50% face to face Her updated medication list for this problem includes:    Celexa 40 Mg Tabs (Citalopram hydrobromide) .Marland Kitchen... 1 by mouth once daily    Ativan 0.5 Mg Tabs (Lorazepam) .Marland Kitchen... 1 by mouth three times a day as needed    Klonopin 1 Mg Tabs (Clonazepam) .Marland Kitchen... 1 by mouth three times a day List of psychs given to pt to make appointment.  Discussed medication use and relaxation techniques.   Complete Medication List: 1)  Celexa 40 Mg Tabs (Citalopram hydrobromide) .Marland Kitchen.. 1 by mouth once daily 2)  Lisinopril-hydrochlorothiazide 10-12.5 Mg Tabs (Lisinopril-hydrochlorothiazide) .... Take one tablet daily- office visit and labs due now 3)  Mirena 20 Mcg/24hr Iud (Levonorgestrel) .... Iud 4)  Freestyle Lite Strp (Glucose blood) .... Accu check two times a day 5)  Freestyle Lancets Misc (Lancets) .... As directed 6)  Ativan 0.5 Mg Tabs (Lorazepam) .Marland Kitchen.. 1 by mouth three times a day as needed 7)  Pravachol 40 Mg Tabs (Pravastatin sodium) .... Take 1 by mouth  at bedtime 8)  Glucophage 1000 Mg Tabs (Metformin hcl) .... Take one tablet twice daily. 9)  Onetouch Lancets Misc (Lancets) .... Pt test 2 times a day 10)  Onetouch Lancets Misc (Lancets) .... Pt test 2 times a day 11)  Onetouch Ultra Test Strp (Glucose blood) .... Pt test 2 times a day 12)  Ketoprofen  .... As needed 13)  Nasonex 50 Mcg/act Susp (Mometasone furoate) .... 2 sprays each nostril once daily 14)  Klor-con 20 Meq Pack (Potassium chloride) .Marland Kitchen.. 1 by mouth once daily 15)  Fluconazole 150 Mg Tabs (Fluconazole) .... Take 1tab  weekly for 6months 16)  Cipro 500 Mg Tabs (Ciprofloxacin hcl) .... Take 1 by mouth two times a day for 5 days 17)  Klonopin 1 Mg Tabs (Clonazepam) .Marland Kitchen.. 1 by mouth three times a day  Patient Instructions: 1)  Please schedule a follow-up appointment in 2 weeks.  Prescriptions: KLONOPIN 1 MG TABS (CLONAZEPAM) 1 by mouth three times a day  #90 x 1   Entered and Authorized by:   Loreen Freud DO   Signed by:   Loreen Freud DO on 03/22/2010   Method used:   Print then Give to Patient   RxID:   [id number]

## 2010-08-13 NOTE — Progress Notes (Signed)
Summary: QUESTION ABOUT TAKING 2 MEDS AT SAME TIME  Phone Note Call from Patient Call back at Home Phone [phone]   Caller: Patient Summary of Call: PATIENT WANTS TO KNOW IF SHE CAN TAKE THE FLUCONAZOLE WHILE SHE IS TAKING THE CIPRO OR SHOULS SHE WAIT UNTIL SHE IS FINISHED WITH THE CIPRO Initial call taken by: Jerolyn Shin,  February 06, 2010 2:34 PM  Follow-up for Phone Call        she can take both Follow-up by: Loreen Freud DO,  February 06, 2010 2:43 PM  Additional Follow-up for Phone Call Additional follow up Details #1::        Patient notified. Additional Follow-up by: Lucious Groves CMA,  February 06, 2010 3:17 PM

## 2010-08-13 NOTE — Assessment & Plan Note (Signed)
Summary: yeast infection/cbs   Vital Signs:  Patient profile:   40 year old female Height:      65.5 inches Weight:      234 pounds Temp:     98.8 degrees F oral Pulse rate:   82 / minute BP sitting:   100 / 78  (left arm)  Vitals Entered By: Jeremy Johann CMA (January 29, 2010 10:15 AM) CC: yeast infection, thick discharge, Vaginal discharge   History of Present Illness:  Vaginal discharge      This is a 40 year old woman who presents with Vaginal discharge.  The symptoms began 4 days ago.  Pt c/o thick white vaginal d/c.  The patient complains of itching and vaginal burning, but denies burning on urination, frequency, urgency, fever, pelvic pain, and back pain.  The discharge is described as white.  The patient denies the following symptoms: genital sores, unusual vaginal bleeding, painful intercourse, rash, myalgias, arthralgias, and headache.  Prior treatment has included OTC antifungals.    Current Medications (verified): 1)  Celexa 40 Mg  Tabs (Citalopram Hydrobromide) .Marland Kitchen.. 1 By Mouth Once Daily 2)  Lisinopril-Hydrochlorothiazide 10-12.5 Mg  Tabs (Lisinopril-Hydrochlorothiazide) .... Take One Tablet Daily- Office Visit and Labs Due Now 3)  Mirena 20 Mcg/24hr Iud (Levonorgestrel) .... Iud 4)  Freestyle Lite   Strp (Glucose Blood) .... Accu Check Two Times A Day 5)  Freestyle Lancets   Misc (Lancets) .... As Directed 6)  Ativan 0.5 Mg  Tabs (Lorazepam) .Marland Kitchen.. 1 By Mouth Three Times A Day As Needed 7)  Pravachol 40 Mg Tabs (Pravastatin Sodium) .... Take 1 By Mouth  At Bedtime 8)  Glucophage 1000 Mg Tabs (Metformin Hcl) .... Take One Tablet Twice Daily. 9)  Onetouch Lancets  Misc (Lancets) .... Pt Test 2 Times A Day 10)  Onetouch Lancets  Misc (Lancets) .... Pt Test 2 Times A Day 11)  Onetouch Ultra Test  Strp (Glucose Blood) .... Pt Test 2 Times A Day 12)  Ketoprofen .... As Needed 13)  Nasonex 50 Mcg/act Susp (Mometasone Furoate) .... 2 Sprays Each Nostril Once Daily 14)  Klor-Con  20 Meq Pack (Potassium Chloride) .Marland Kitchen.. 1 By Mouth Once Daily 15)  Fluconazole 150 Mg Tabs (Fluconazole) .Marland Kitchen.. 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  Past Medical History: Reviewed history from 02/08/2009 and no changes required. Depression Hypertension Diabetes mellitus, type II (12/2006) Current Problems:  ACUTE SINUSITIS, UNSPECIFIED (ICD-461.9) FIBROIDS, UTERUS (ICD-218.9) BACTERIAL VAGINITIS (ICD-616.10) ROUTINE GYNECOLOGICAL EXAMINATION (ICD-V72.31) IUD (ICD-V25.1) FATIGUE (ICD-780.79) CARDIAC MURMUR (ICD-785.2) PREVENTIVE HEALTH CARE (ICD-V70.0) FAMILY HISTORY DEPRESSION (ICD-V17.0) FAMILY HISTORY DIABETES 1ST DEGREE RELATIVE (ICD-V18.0) ANXIETY STATE, UNSPECIFIED (ICD-300.00) BACK PAIN, THORACIC REGION (ICD-724.1) BACK STRAIN, LUMBAR (ICD-847.2) OBESITY NOS (ICD-278.00) DM, UNCOMPLICATED, TYPE II (ICD-250.00) SCARLET FEVER (ICD-034.1) ABORTION (ICD-637.90) HYPERTENSION (ICD-401.9) DEPRESSION (ICD-311)  Past Surgical History: Reviewed history from 10/16/2009 and no changes required. Tonsillectomy mirena 2010  Family  History: Reviewed history from 09/08/2008 and no changes required. Family History Diabetes 1st degree  relative Family History Hypertension Family History Depression\par MGF---colon cancer 38yo  Social History: Reviewed history from 10/16/2009 and no changes required. 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 Genitalia:  normal introitus and vaginal discharge.  + thick white d/c no odor Extremities:  No clubbing, cyanosis, edema, or deformity noted with normal full range of motion of all joints.   Psych:  Oriented X3 and normally interactive.     Impression & Recommendations:  Problem # 1:  VAGINITIS, CANDIDAL (ICD-112.1)  Her updated medication list for this problem includes:    Fluconazole 150 Mg Tabs (Fluconazole) .Marland Kitchen... 1 by mouth once daily x1, may repeat in 3 days as needed  Orders: TLB-Wet [place] / Fungus (87210-WPREP) T-Chlamydia  Probe, urine [id number]) T-GC Probe, urine [id number]) UA Dipstick w/o Micro (manual) (29562) T-Culture, Urine ([id number])  Discussed treatment regimen and preventive measures.   Complete Medication List: 1)  Celexa 40 Mg Tabs (Citalopram hydrobromide) .Marland Kitchen.. 1 by mouth once daily 2)  Lisinopril-hydrochlorothiazide 10-12.5 Mg Tabs (Lisinopril-hydrochlorothiazide) .... Take one tablet daily- office visit and labs due now 3)  Mirena 20 Mcg/24hr Iud (Levonorgestrel) .... Iud 4)  Freestyle Lite Strp (Glucose blood) .... Accu check two times a day 5)  Freestyle Lancets Misc (Lancets) .... As directed 6)  Ativan 0.5 Mg Tabs (Lorazepam) .Marland Kitchen.. 1 by mouth three times a day as needed 7)  Pravachol 40 Mg Tabs (Pravastatin sodium) .... Take 1 by mouth  at bedtime 8)  Glucophage 1000 Mg Tabs (Metformin  hcl) .... Take one tablet twice daily. 9)  Onetouch Lancets Misc (Lancets) .... Pt test 2 times a day 10)  Onetouch Lancets Misc (Lancets) .... Pt test 2 times a day 11)  Onetouch Ultra Test Strp (Glucose blood) .... Pt test 2 times a day 12)  Ketoprofen  .... As needed 13)  Nasonex 50 Mcg/act Susp (Mometasone furoate) .... 2 sprays each nostril once daily 14)  Klor-con 20 Meq Pack (Potassium chloride) .Marland Kitchen.. 1 by mouth once daily 15)  Fluconazole 150 Mg Tabs (Fluconazole) .Marland Kitchen.. 1 by mouth once daily x1, may repeat in 3 days as needed Prescriptions: FLUCONAZOLE 150 MG TABS (FLUCONAZOLE) 1 by mouth once daily x1, may repeat in 3 days as needed  #2 x 2   Entered and Authorized by:   Loreen Freud DO   Signed by:   Loreen Freud DO on 01/29/2010   Method used:   Electronically to        [id number]* (retail)       [address]       [place], [place]  [address]       Ph: [phone] or [phone]       Fax: [phone]   RxID:   [id number]   Laboratory Results   Urine Tests    Routine Urinalysis   Color: yellow Appearance: Clear Glucose: negative   (Normal Range: Negative) Bilirubin: negative   (Normal Range: Negative) Ketone: negative   (Normal Range: Negative) Spec. Gravity: 1.020   (Normal Range: 1.003-1.035) Blood: negative   (Normal Range: Negative) pH:  5.0   (Normal Range: 5.0-8.0) Protein: negative   (Normal Range: Negative) Urobilinogen: 0.2   (Normal Range: 0-1) Nitrite: negative   (Normal Range: Negative) Leukocyte Esterace: small   (Normal Range: Negative)

## 2010-08-13 NOTE — Assessment & Plan Note (Signed)
Summary: fLU?KDC   Vital Signs:  Patient profile:   40 year old female Height:      65.25 inches Weight:      229 pounds BMI:     37.95 Temp:     98.6 degrees F oral Pulse rate:   78 / minute Pulse rhythm:   regular BP sitting:   122 / 80  (left arm) Cuff size:   large  Vitals Entered By: Army Fossa CMA (August 23, 2009 1:40 PM) CC: Pt c/o sinuses started bothering her last weekend, she had fever on monday of 101.9 her body felt achey then. , URI symptoms   History of Present Illness:       This is a 40 year old woman who presents with URI symptoms.  The patient complains of nasal congestion, purulent nasal discharge, sore throat, productive cough, and earache, but denies clear nasal discharge, dry cough, and sick contacts.  Associated symptoms include fever of 100.5-103 degrees.  The patient denies fever, low-grade fever (<100.5 degrees), fever of 103.1-104 degrees, fever to >104 degrees, stiff neck, dyspnea, wheezing, rash, vomiting, diarrhea, use of an antipyretic, and response to antipyretic.  The patient also reports headache and muscle aches.  The patient denies itchy watery eyes, itchy throat, sneezing, seasonal symptoms, response to antihistamine, and severe fatigue.  The patient denies the following risk factors for Strep sinusitis: unilateral facial pain, unilateral nasal discharge, poor response to decongestant, double sickening, tooth pain, Strep exposure, tender adenopathy, and absence of cough.    Allergies (verified): No Known Drug Allergies  Physical Exam  General:  Well-developed,well-nourished,in no acute distress; alert,appropriate and cooperative throughout examination Ears:  L ear normal, R TM erythema, and R TM bulging.   Nose:  L frontal sinus tenderness, L maxillary sinus tenderness, R frontal sinus tenderness, and R maxillary sinus tenderness.   Mouth:  no exudates and pharyngeal erythema.   Neck:  cervical lymphadenopathy.   Lungs:  Normal respiratory  effort, chest expands symmetrically. Lungs are clear to auscultation, no crackles or wheezes. Heart:  normal rate and no murmur.   Skin:  Intact without suspicious lesions or rashes Cervical Nodes:  R anterior LN enlarged and L anterior LN enlarged.   Psych:  Cognition and judgment appear intact. Alert and cooperative with normal attention span and concentration. No apparent delusions, illusions, hallucinations   Impression & Recommendations:  Problem # 1:  ACUTE SINUSITIS, UNSPECIFIED (ICD-461.9)  The following medications were removed from the medication list:    Nasonex 50 Mcg/act Susp (Mometasone furoate) .Marland Kitchen... 2 sprays each nostril once daily Her updated medication list for this problem includes:    Augmentin 875-125 Mg Tabs (Amoxicillin-pot clavulanate) .Marland Kitchen... 1 by mouth two times a day    Nasonex 50 Mcg/act Susp (Mometasone furoate) .Marland Kitchen... 2 sprays each nostril once daily  Orders: ENT Referral (ENT)  Take antibiotics for full duration. Discussed treatment options including indications for coronal CT scan of sinuses and ENT referral.   Instructed on treatment. Call if symptoms persist or worsen.   Complete Medication List: 1)  Celexa 40 Mg Tabs (Citalopram hydrobromide) .Marland Kitchen.. 1 by mouth once daily 2)  Lisinopril-hydrochlorothiazide 10-12.5 Mg Tabs (Lisinopril-hydrochlorothiazide) .... Take one tablet daily 3)  Mirena 20 Mcg/24hr Iud (Levonorgestrel) .... Iud 4)  Freestyle Lite Strp (Glucose blood) .... Accu check two times a day 5)  Freestyle Lancets Misc (Lancets) .... As directed 6)  Ativan 0.5 Mg Tabs (Lorazepam) .Marland Kitchen.. 1 by mouth three times a day as needed  7)  Pravachol 40 Mg Tabs (Pravastatin sodium) .... Take 1 by mouth  at bedtime 8)  Glucophage 1000 Mg Tabs (Metformin hcl) .... Take one tablet twice daily. 9)  Onetouch Lancets Misc (Lancets) .... Pt test 2 times a day 10)  Onetouch Lancets Misc (Lancets) .... Pt test 2 times a day 11)  Onetouch Ultra Test Strp (Glucose  blood) .... Pt test 2 times a day 12)  Ketoprofen  .... As needed 13)  Augmentin 875-125 Mg Tabs (Amoxicillin-pot clavulanate) .Marland Kitchen.. 1 by mouth two times a day 14)  Fluconazole 150 Mg Tabs (Fluconazole) .Marland Kitchen.. 1 by mouth once daily x1 , may repeat in 3 days as needed 15)  Nasonex 50 Mcg/act Susp (Mometasone furoate) .... 2 sprays each nostril once daily  Other Orders: Flu A+B (81191) Rapid Strep (47829) Prescriptions: NASONEX 50 MCG/ACT SUSP (MOMETASONE FUROATE) 2 sprays each nostril once daily  #1 x 0   Entered and Authorized by:   Loreen Freud DO   Signed by:   Loreen Freud DO on 08/23/2009   Method used:   Historical   RxID:   [id number] FLUCONAZOLE 150 MG TABS (FLUCONAZOLE) 1 by mouth once daily x1 , may repeat in 3 days as needed  #2 x 2   Entered and Authorized by:   Loreen Freud DO   Signed by:   Loreen Freud DO on 08/23/2009   Method used:   Electronically to        [id number]* (retail)       [address]       [place], [place]  [address]       Ph: [phone] or [phone]       Fax: [phone]   RxID:   [id number] AUGMENTIN 875-125 MG TABS (AMOXICILLIN-POT CLAVULANATE) 1 by mouth two times a day  #20 x 0   Entered and Authorized by:   Loreen Freud DO   Signed by:   Loreen Freud DO on 08/23/2009   Method used:   Electronically to        [id number]* (retail)       [address]       [place], [place]  [address]       Ph: [phone] or [phone]       Fax: [phone]   RxID:   [id number]

## 2010-08-13 NOTE — Consult Note (Signed)
Summary: [facility]  [facility]   Imported By: Lanelle Bal 10/08/2009 10:17:12  _____________________________________________________________________  External Attachment:    Type:   Image     Comment:   External Document

## 2010-08-13 NOTE — Assessment & Plan Note (Signed)
Summary: frequent urination/cbs   Vital Signs:  Patient profile:   40 year old female Height:      65.5 inches Weight:      233.12 pounds Temp:     98.3 degrees F oral BP sitting:   126 / 80  Vitals Entered By: Shary Decamp (Nov 17, 2009 9:09 AM) CC: urinary frequency, dysuria off & on x last week   History of Present Illness: Pt here for what she thinks is a UTI. She has had one before a few years ago. She is having frequncy w/o much production with tingling, no fever or chills, no back pain, no suprapubic tenderness.  Problems Prior to Update: 1)  Sinusitis, Recurrent  (ICD-473.9) 2)  Keloid  (ICD-701.4) 3)  Acute Sinusitis, Unspecified  (ICD-461.9) 4)  Fibroids, Uterus  (ICD-218.9) 5)  Bacterial Vaginitis  (ICD-616.10) 6)  Routine Gynecological Examination  (ICD-V72.31) 7)  Iud  (ICD-V25.1) 8)  Fatigue  (ICD-780.79) 9)  Cardiac Murmur  (ICD-785.2) 10)  Preventive Health Care  (ICD-V70.0) 11)  Family History Depression  (ICD-V17.0) 12)  Family History Diabetes 1st Degree Relative  (ICD-V18.0) 13)  Anxiety State, Unspecified  (ICD-300.00) 14)  Back Pain, Thoracic Region  (ICD-724.1) 15)  Back Strain, Lumbar  (ICD-847.2) 16)  Obesity Nos  (ICD-278.00) 17)  Dm, Uncomplicated, Type II  (ICD-250.00) 18)  Scarlet Fever  (ICD-034.1) 19)  Abortion  (ICD-637.90) 20)  Hypertension  (ICD-401.9) 21)  Depression  (ICD-311)  Medications Prior to Update: 1)  Celexa 40 Mg  Tabs (Citalopram Hydrobromide) .Marland Kitchen.. 1 By Mouth Once Daily 2)  Lisinopril-Hydrochlorothiazide 10-12.5 Mg  Tabs (Lisinopril-Hydrochlorothiazide) .... Take One Tablet Daily- Office Visit and Labs Due Now 3)  Mirena 20 Mcg/24hr Iud (Levonorgestrel) .... Iud 4)  Freestyle Lite   Strp (Glucose Blood) .... Accu Check Two Times A Day 5)  Freestyle Lancets   Misc (Lancets) .... As Directed 6)  Ativan 0.5 Mg  Tabs (Lorazepam) .Marland Kitchen.. 1 By Mouth Three Times A Day As Needed 7)  Pravachol 40 Mg Tabs (Pravastatin Sodium) .... Take  1 By Mouth  At Bedtime 8)  Glucophage 1000 Mg Tabs (Metformin Hcl) .... Take One Tablet Twice Daily. 9)  Onetouch Lancets  Misc (Lancets) .... Pt Test 2 Times A Day 10)  Onetouch Lancets  Misc (Lancets) .... Pt Test 2 Times A Day 11)  Onetouch Ultra Test  Strp (Glucose Blood) .... Pt Test 2 Times A Day 12)  Ketoprofen .... As Needed 13)  Nasonex 50 Mcg/act Susp (Mometasone Furoate) .... 2 Sprays Each Nostril Once Daily 14)  Klor-Con 20 Meq Pack (Potassium Chloride) .Marland Kitchen.. 1 By Mouth Once Daily  Current Medications (verified): 1)  Celexa 40 Mg  Tabs (Citalopram Hydrobromide) .Marland Kitchen.. 1 By Mouth Once Daily 2)  Lisinopril-Hydrochlorothiazide 10-12.5 Mg  Tabs (Lisinopril-Hydrochlorothiazide) .... Take One Tablet Daily- Office Visit and Labs Due Now 3)  Mirena 20 Mcg/24hr Iud (Levonorgestrel) .... Iud 4)  Freestyle Lite   Strp (Glucose Blood) .... Accu Check Two Times A Day 5)  Freestyle Lancets   Misc (Lancets) .... As Directed 6)  Ativan 0.5 Mg  Tabs (Lorazepam) .Marland Kitchen.. 1 By Mouth Three Times A Day As Needed 7)  Pravachol 40 Mg Tabs (Pravastatin Sodium) .... Take 1 By Mouth  At Bedtime 8)  Glucophage 1000 Mg Tabs (Metformin Hcl) .... Take One Tablet Twice Daily. 9)  Onetouch Lancets  Misc (Lancets) .... Pt Test 2 Times A Day 10)  Onetouch Lancets  Misc (Lancets) .... Pt  Test 2 Times A Day 11)  Onetouch Ultra Test  Strp (Glucose Blood) .... Pt Test 2 Times A Day 12)  Ketoprofen .... As Needed 13)  Nasonex 50 Mcg/act Susp (Mometasone Furoate) .... 2 Sprays Each Nostril Once Daily 14)  Klor-Con 20 Meq Pack (Potassium Chloride) .Marland Kitchen.. 1 By Mouth Once Daily  Allergies (verified): No Known Drug Allergies  Physical Exam  General:  Well-developed,well-nourished,in no acute distress; alert,appropriate and cooperative throughout examination, mildly overweight and nontoxic appearing. Head:  Normocephalic and atraumatic without obvious abnormalities. No apparent alopecia or balding. Eyes:  Conjunctiva clear  bilaterally.  Ears:  External ear exam shows no significant lesions or deformities.  Otoscopic examination reveals clear canals, tympanic membranes are intact bilaterally without bulging, retraction, inflammation or discharge. Hearing is grossly normal bilaterally. Nose:  External nasal examination shows no deformity or inflammation. Nasal mucosa are pink and moist without lesions or exudates. Mouth:  Oral mucosa and oropharynx without lesions or exudates.  Teeth in good repair. Neck:  No deformities, masses, or tenderness noted. Lungs:  Normal respiratory effort, chest expands symmetrically. Lungs are clear to auscultation, no crackles or wheezes. Heart:  normal rate and Grade  1 /6 systolic ejection murmur.   Abdomen:  Bowel sounds positive,abdomen soft and non-tender without masses, organomegaly or hernias noted. No suprapubic tenderness. Msk:  No CVAT   Impression & Recommendations:  Problem # 1:  URINARY FREQUENCY (ICD-788.41) Assessment New  Poss urethritis with clear urine by dip. Will culture to insure no infection. Use Pyridium for now. Will report Mon. Her updated medication list for this problem includes:    Pyridium 200 Mg Tabs (Phenazopyridine hcl) ..... One tab by mouth three times a day  Discussed use of medication.   Complete Medication List: 1)  Celexa 40 Mg Tabs (Citalopram hydrobromide) .Marland Kitchen.. 1 by mouth once daily 2)  Lisinopril-hydrochlorothiazide 10-12.5 Mg Tabs (Lisinopril-hydrochlorothiazide) .... Take one tablet daily- office visit and labs due now 3)  Mirena 20 Mcg/24hr Iud (Levonorgestrel) .... Iud 4)  Freestyle Lite Strp (Glucose blood) .... Accu check two times a day 5)  Freestyle Lancets Misc (Lancets) .... As directed 6)  Ativan 0.5 Mg Tabs (Lorazepam) .Marland Kitchen.. 1 by mouth three times a day as needed 7)  Pravachol 40 Mg Tabs (Pravastatin sodium) .... Take 1 by mouth  at bedtime 8)  Glucophage 1000 Mg Tabs (Metformin hcl) .... Take one tablet twice daily. 9)   Onetouch Lancets Misc (Lancets) .... Pt test 2 times a day 10)  Onetouch Lancets Misc (Lancets) .... Pt test 2 times a day 11)  Onetouch Ultra Test Strp (Glucose blood) .... Pt test 2 times a day 12)  Ketoprofen  .... As needed 13)  Nasonex 50 Mcg/act Susp (Mometasone furoate) .... 2 sprays each nostril once daily 14)  Klor-con 20 Meq Pack (Potassium chloride) .Marland Kitchen.. 1 by mouth once daily 15)  Pyridium 200 Mg Tabs (Phenazopyridine hcl) .... One tab by mouth three times a day  Other Orders: UA Dipstick w/o Micro (manual) (78295) Specimen Handling (99000) T-Culture, Urine ([id number])  Patient Instructions: 1)  Use Pyridium. 2)  Will call culture results Mon. Prescriptions: PYRIDIUM 200 MG TABS (PHENAZOPYRIDINE HCL) one tab by mouth three times a day  #21 x 0   Entered and Authorized by:   Shaune Leeks MD   Signed by:   Shaune Leeks MD on 11/17/2009   Method used:   Electronically to        [id number] (retail)       [address]       [place], [place]  [address]       Ph: [phone] or [phone]       Fax: [phone]   RxID:   [id number]   Laboratory Results   Urine Tests    Routine Urinalysis   Glucose: negative   (Normal Range: Negative) Bilirubin: negative   (Normal Range: Negative) Ketone: negative   (Normal Range: Negative) Spec. Gravity: 1.025   (Normal Range: 1.003-1.035) Blood: negative   (Normal Range: Negative) pH: 5.0   (Normal Range: 5.0-8.0) Protein: negative   (Normal Range: Negative) Urobilinogen: 0.2   (Normal Range: 0-1) Nitrite: negative   (Normal Range: Negative) Leukocyte Esterace: negative   (Normal Range: Negative)

## 2010-08-13 NOTE — Consult Note (Signed)
Summary: [facility]  [facility]   Imported By: Lanelle Bal 09/03/2009 12:52:54  _____________________________________________________________________  External Attachment:    Type:   Image     Comment:   External Document

## 2010-08-13 NOTE — Assessment & Plan Note (Signed)
Summary: cpx/kdc   Vital Signs:  Patient profile:   40 year old female Height:      65.5 inches Weight:      230 pounds Pulse rate:   82 / minute Pulse rhythm:   regular BP sitting:   124 / 78  (left arm) Cuff size:   large  Vitals Entered By: Army Fossa CMA (October 16, 2009 8:39 AM) CC: Pt here for cpx, no PAP.    History of Present Illness: Pt here for cpe and labs.  No pap-- pt sees Dr Renaldo Fiddler.     Preventive Screening-Counseling & Management  Alcohol-Tobacco     Alcohol drinks/day: <1     Alcohol type: red wine, dark beer     Smoking Status: never     Passive Smoke Exposure: no  Caffeine-Diet-Exercise     Caffeine use/day: 1     Does Patient Exercise: yes     Type of exercise: 1     Exercise Counseling: to improve exercise regimen  Hep-HIV-STD-Contraception     HIV Risk: no     Dental Visit-last 6 months yes     Dental Care Counseling: to seek dental care; no dental care within six months     SBE monthly: yes  Safety-Violence-Falls     Seat Belt Use: 100      Sexual History:  currently monogamous.    Current Medications (verified): 1)  Celexa 40 Mg  Tabs (Citalopram Hydrobromide) .Marland Kitchen.. 1 By Mouth Once Daily 2)  Lisinopril-Hydrochlorothiazide 10-12.5 Mg  Tabs (Lisinopril-Hydrochlorothiazide) .... Take One Tablet Daily- Office Visit and Labs Due Now 3)  Mirena 20 Mcg/24hr Iud (Levonorgestrel) .... Iud 4)  Freestyle Lite   Strp (Glucose Blood) .... Accu Check Two Times A Day 5)  Freestyle Lancets   Misc (Lancets) .... As Directed 6)  Ativan 0.5 Mg  Tabs (Lorazepam) .Marland Kitchen.. 1 By Mouth Three Times A Day As Needed 7)  Pravachol 40 Mg Tabs (Pravastatin Sodium) .... Take 1 By Mouth  At Bedtime 8)  Glucophage 1000 Mg Tabs (Metformin Hcl) .... Take One Tablet Twice Daily. 9)  Onetouch Lancets  Misc (Lancets) .... Pt Test 2 Times A Day 10)  Onetouch Lancets  Misc (Lancets) .... Pt Test 2 Times A Day 11)  Onetouch Ultra Test  Strp (Glucose Blood) .... Pt Test 2 Times A  Day 12)  Ketoprofen .... As Needed 13)  Nasonex 50 Mcg/act Susp (Mometasone Furoate) .... 2 Sprays Each Nostril Once Daily  Allergies (verified): No Known Drug Allergies  Past History:  Past Surgical History: Tonsillectomy mirena 2010  Family History: Reviewed history from 09/08/2008 and no changes required. Family History Diabetes 1st degree relative Family History Hypertension Family History Depression\par MGF---colon cancer 38yo  Social History: Reviewed history from 09/08/2008 and no changes required. 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 Eyes:  Denies blurring, discharge, double vision, eye irritation, eye pain, halos, itching, light sensitivity, red eye, vision loss-1 eye, and vision loss-both eyes. ENT:  Denies decreased hearing, difficulty swallowing, ear discharge, earache, hoarseness, nasal congestion, nosebleeds, postnasal drainage, ringing in ears, sinus pressure, and sore throat; + sinus problems--  Aquilla Hacker,  PA--- [facility]. CV:  Denies bluish discoloration of lips or nails, chest pain or discomfort, difficulty breathing at night, difficulty breathing while lying down, fainting, fatigue, leg cramps with exertion, lightheadness, near fainting, palpitations, shortness of breath with exertion, swelling of feet, swelling of hands, and weight gain. Resp:  Denies chest discomfort, chest pain with inspiration, cough, coughing up blood, excessive snoring, hypersomnolence, morning headaches, pleuritic, shortness of breath, sputum productive, and wheezing. GI:  Denies abdominal pain, bloody stools, change in bowel habits, constipation, dark tarry  stools, diarrhea, excessive appetite, gas, hemorrhoids, indigestion, loss of appetite, nausea, vomiting, vomiting blood, and yellowish skin color. GU:  Denies abnormal vaginal bleeding, decreased libido, discharge, dysuria, genital sores, hematuria, incontinence, nocturia, urinary frequency, and urinary hesitancy. MS:  Denies joint pain, joint redness, joint swelling, loss of strength, low back pain, mid back pain, muscle aches, muscle , cramps, muscle weakness, stiffness, and thoracic pain. Derm:  Denies changes in color of skin, changes in nail beds, dryness, excessive perspiration, flushing, hair loss, insect bite(s), itching, lesion(s), poor wound healing, and rash. Neuro:  Denies brief paralysis, difficulty with concentration, disturbances in coordination, falling down, headaches, inability to speak, memory loss, numbness, poor balance, seizures, sensation of room spinning, tingling, tremors, visual disturbances, and weakness. Psych:  Denies alternate hallucination ( auditory/visual), anxiety, depression, easily angered, easily tearful, irritability, mental problems, panic attacks, sense of great danger, suicidal thoughts/plans, thoughts of violence, unusual visions or sounds, and thoughts /plans of harming others. Heme:  Denies abnormal bruising, bleeding, enlarge lymph nodes, fevers, pallor, and skin discoloration. Allergy:  Denies hives or rash, itching eyes, persistent infections, seasonal allergies, and sneezing.  Physical Exam  General:  Well-developed,well-nourished,in no acute distress; alert,appropriate and cooperative throughout examination Head:  Normocephalic and atraumatic without obvious abnormalities. No apparent alopecia or balding. Eyes:  No corneal or conjunctival inflammation noted. EOMI. Perrla. Funduscopic exam benign, without hemorrhages, exudates or papilledema. Vision grossly normal. Ears:  External ear exam shows no significant lesions or deformities.  Otoscopic  examination reveals clear canals, tympanic membranes are intact bilaterally without bulging, retraction, inflammation or discharge. Hearing is grossly normal bilaterally. Nose:  External nasal examination shows no deformity or inflammation. Nasal mucosa are pink and moist without lesions or exudates. Mouth:  Oral mucosa and oropharynx without lesions or exudates.  Teeth in good repair. Neck:  No deformities, masses, or tenderness noted. Chest Wall:  No deformities, masses, or tenderness noted. Breasts:  gyn Lungs:  Normal respiratory effort, chest expands symmetrically. Lungs are clear to auscultation, no crackles or wheezes. Heart:  normal rate and Grade  1 /6 systolic ejection murmur.   Abdomen:  Bowel sounds positive,abdomen soft and non-tender without masses, organomegaly or hernias noted. Rectal:  gyn Genitalia:  gyn Msk:  normal ROM, no joint tenderness, no joint swelling, no joint warmth, no redness over joints, no joint deformities, no joint instability, and no crepitation.   Pulses:  R and L carotid,radial,femoral,dorsalis pedis and posterior tibial pulses are full and equal bilaterally Extremities:  No clubbing, cyanosis, edema, or deformity noted with normal full range of motion of all joints.   Neurologic:  No cranial nerve deficits noted. Station and gait are normal. Plantar reflexes are down-going bilaterally. DTRs are symmetrical throughout. Sensory, motor and coordinative functions appear intact. Skin:  Intact without suspicious lesions or rashes Cervical Nodes:  No lymphadenopathy noted Psych:  Cognition and judgment appear intact. Alert and cooperative with normal attention span and concentration. No apparent delusions, illusions, hallucinations  Diabetes Management Exam:  Foot Exam (with socks and/or shoes not present):       Sensory-Pinprick/Light touch:          Left medial foot (L-4): normal          Left dorsal foot (L-5): normal          Left lateral foot (S-1):  normal          Right medial foot (L-4): normal          Right dorsal foot (L-5): normal          Right lateral foot (S-1): normal       Sensory-Monofilament:          Left foot: normal          Right foot: normal       Inspection:          Left foot: normal          Right foot: normal       Nails:          Left foot: normal          Right foot: normal    Eye Exam:       Eye Exam done elsewhere          Date: 09/26/2009          Results: normal          Done by: [facility]   Impression & Recommendations:  Problem # 1:  PREVENTIVE HEALTH CARE (ICD-V70.0) ghm utd  pap per gyn  Orders: Venipuncture (09811) TLB-Lipid Panel (80061-LIPID) TLB-BMP (Basic Metabolic Panel-BMET) (80048-METABOL) TLB-CBC Platelet - w/Differential (85025-CBCD) TLB-Hepatic/Liver Function Pnl (80076-HEPATIC) TLB-TSH (Thyroid Stimulating Hormone) (84443-TSH) TLB-T3, Free (Triiodothyronine) (84481-T3FREE) TLB-T4 (Thyrox), Free (84439-FT4R) TLB-A1C / Hgb A1C (Glycohemoglobin) (83036-A1C) TLB-Microalbumin/Creat Ratio, Urine (82043-MALB)  Problem # 2:  ANXIETY STATE, UNSPECIFIED (ICD-300.00)  Her updated medication list for this problem includes:    Celexa 40 Mg Tabs (Citalopram hydrobromide) .Marland Kitchen... 1 by mouth once daily    Ativan 0.5 Mg Tabs (Lorazepam) .Marland Kitchen... 1 by mouth three times a day as needed  Discussed medication use and relaxation techniques.   Problem # 3:  DM, UNCOMPLICATED, TYPE II (ICD-250.00)  Her updated medication list for this problem includes:    Lisinopril-hydrochlorothiazide 10-12.5 Mg Tabs (Lisinopril-hydrochlorothiazide) .Marland Kitchen... Take one tablet daily- office visit and labs due now    Glucophage 1000 Mg Tabs (Metformin hcl) .Marland Kitchen... Take one tablet twice daily.  Orders: Venipuncture (91478) TLB-Lipid Panel (80061-LIPID) TLB-BMP (Basic Metabolic Panel-BMET) (80048-METABOL) TLB-CBC Platelet - w/Differential (85025-CBCD) TLB-Hepatic/Liver Function Pnl (80076-HEPATIC) TLB-TSH  (Thyroid Stimulating Hormone) (84443-TSH) TLB-T3, Free (Triiodothyronine) (84481-T3FREE) TLB-T4 (Thyrox), Free [phone]) TLB-A1C / Hgb A1C (Glycohemoglobin) (83036-A1C) TLB-Microalbumin/Creat Ratio, Urine (82043-MALB)  Labs Reviewed: Creat: 0.7 (09/18/2008)     Last Eye Exam: normal (09/26/2009) Reviewed HgBA1c results: 7.7 (09/18/2008)  6.4 (04/29/2007)  Problem # 4:  HYPERTENSION (ICD-401.9)  Her updated medication list for this problem includes:    Lisinopril-hydrochlorothiazide 10-12.5 Mg Tabs (Lisinopril-hydrochlorothiazide) .Marland Kitchen... Take one tablet daily- office visit and labs due now  Orders: Venipuncture (65784) TLB-Lipid Panel (80061-LIPID) TLB-BMP (Basic Metabolic Panel-BMET) (80048-METABOL) TLB-CBC Platelet - w/Differential (85025-CBCD) TLB-Hepatic/Liver Function Pnl (80076-HEPATIC) TLB-TSH (Thyroid Stimulating Hormone) (84443-TSH) TLB-T3, Free (Triiodothyronine) (84481-T3FREE) TLB-T4 (Thyrox), Free (84439-FT4R) TLB-A1C / Hgb A1C (Glycohemoglobin) (83036-A1C) TLB-Microalbumin/Creat Ratio, Urine (82043-MALB)  BP today: 124/78 Prior BP: 122/80 (08/23/2009)  Labs Reviewed: K+: 3.8 (09/18/2008) Creat: : 0.7 (09/18/2008)   Chol: 200 (09/08/2008)   HDL: 34.4 (09/08/2008)   LDL: 153 (09/08/2008)  TG: 65 (09/08/2008)  Complete Medication List: 1)  Celexa 40 Mg Tabs (Citalopram hydrobromide) .Marland Kitchen.. 1 by mouth once daily 2)  Lisinopril-hydrochlorothiazide 10-12.5 Mg Tabs (Lisinopril-hydrochlorothiazide) .... Take one tablet daily- office visit and labs due now 3)  Mirena 20 Mcg/24hr Iud (Levonorgestrel) .... Iud 4)  Freestyle Lite Strp (Glucose blood) .... Accu check two times a day 5)  Freestyle Lancets Misc (Lancets) .... As directed 6)  Ativan 0.5 Mg Tabs (Lorazepam) .Marland Kitchen.. 1 by mouth three times a day as needed 7)  Pravachol 40 Mg Tabs (Pravastatin sodium) .... Take 1 by mouth  at bedtime 8)  Glucophage 1000 Mg Tabs (Metformin hcl) .... Take one tablet twice daily. 9)   Onetouch Lancets Misc (Lancets) .... Pt test 2 times a day 10)  Onetouch Lancets Misc (Lancets) .... Pt test 2 times a day 11)  Onetouch Ultra Test Strp (Glucose blood) .... Pt test 2 times a day 12)  Ketoprofen  .... As needed 13)  Nasonex 50 Mcg/act Susp (Mometasone furoate) .... 2 sprays each nostril once daily Prescriptions: ATIVAN 0.5 MG  TABS (LORAZEPAM) 1 by mouth three times a day as needed  #90 x 0   Entered and Authorized by:   Loreen Freud DO   Signed by:   Loreen Freud DO on 10/16/2009   Method used:   Print then Give to Patient   RxID:   [id number] ONETOUCH ULTRA TEST  STRP (GLUCOSE BLOOD) pt test 2 times a day  #100 x 11   Entered and Authorized by:   Loreen Freud DO   Signed by:   Loreen Freud DO on 10/16/2009   Method used:   Electronically to        [facility]. [phone]* (retail)       [address]       [place], [place]  [address]       Ph: [phone]       Fax: [phone]   RxID:   [id number] [facility] LANCETS  MISC (LANCETS) pt test 2 times a day  #100 x 11   Entered and Authorized by:   Loreen Freud DO   Signed by:   Loreen Freud DO on 10/16/2009   Method used:   Electronically to        [facility]. [phone]* (retail)       [address]       [place], [place]  [address]       Ph: [phone]       Fax: [phone]   RxID:   [id number] GLUCOPHAGE 1000 MG TABS (METFORMIN HCL) Take one tablet twice daily.  #60 Tablet x 5   Entered and Authorized by:   Loreen Freud DO   Signed by:   Loreen Freud DO on 10/16/2009   Method used:   Electronically to        [facility]. [phone]* (retail)       [address]       [place], [place]  [address]       Ph: [phone]       Fax: [phone]   RxID:   [id number] PRAVACHOL 40 MG TABS (PRAVASTATIN SODIUM) take 1 by mouth  at bedtime  #30 Tablet x 5   Entered and Authorized by:    Loreen Freud DO  Signed by:   Loreen Freud DO on 10/16/2009   Method used:   Electronically to        [facility]. [phone]* (retail)       [address]       [place], [place]  [address]       Ph: [phone]       Fax: [phone]   RxID:   [id number] LISINOPRIL-HYDROCHLOROTHIAZIDE 10-12.5 MG  TABS (LISINOPRIL-HYDROCHLOROTHIAZIDE) TAKE ONE TABLET DAILY- OFFICE VISIT AND LABS DUE NOW  #30 x 5   Entered and Authorized by:   Loreen Freud DO   Signed by:   Loreen Freud DO on 10/16/2009   Method used:   Electronically to        [facility]. [phone]* (retail)       [address]       [place], [place]  [address]       Ph: [phone]       Fax: [phone]   RxID:   [id number] CELEXA 40 MG  TABS (CITALOPRAM HYDROBROMIDE) 1 by mouth once daily  #30 Tablet x 11   Entered and Authorized by:   Loreen Freud DO   Signed by:   Loreen Freud DO on 10/16/2009   Method used:   Electronically to        [facility]. [phone]* (retail)       [address]       [place], [place]  [address]       Ph: [phone]       Fax: [phone]   RxID:   [id number]     Flu Vaccine Result Date:  10/16/2009 Flu Vaccine Next Due:  Not Indicated   Appended Document: cpx/kdc  Laboratory Results   Urine Tests   Date/Time Reported: October 16, 2009 11:22 AM   Routine Urinalysis   Color: yellow Appearance: Clear Glucose: negative   (Normal Range: Negative) Bilirubin: negative   (Normal Range: Negative) Ketone: negative   (Normal Range: Negative) Spec. Gravity: 1.025   (Normal Range: 1.003-1.035) Blood: negative   (Normal Range: Negative) pH: 5.0   (Normal Range: 5.0-8.0) Protein: negative   (Normal Range: Negative) Urobilinogen: 0.2   (Normal Range: 0-1) Nitrite: negative   (Normal Range: Negative) Leukocyte Esterace: negative   (Normal Range: Negative)    Comments:  Floydene Flock  October 16, 2009 11:23 AM

## 2010-08-15 NOTE — Assessment & Plan Note (Signed)
Summary: 3 MONTH FOLLOWUP/KN   Vital Signs:  Patient profile:   40 year old female Height:      65.5 inches Weight:      234.2 pounds BMI:     38.52 Pulse rate:   84 / minute Pulse rhythm:   regular BP sitting:   118 / 80  (left arm) Cuff size:   large  Vitals Entered By: Almeta Monas CMA Duncan Dull) (July 16, 2010 3:17 PM) CC: 3 MO F/U ON MEDS---Request IUD check   History of Present Illness: Pt here c/o "something in her vagina"  ---her husband felt something the other night.   No other complaints.   Current Medications (verified): 1)  Celexa 40 Mg  Tabs (Citalopram Hydrobromide) .Marland Kitchen.. 1 By Mouth Once Daily 2)  Lisinopril-Hydrochlorothiazide 10-12.5 Mg  Tabs (Lisinopril-Hydrochlorothiazide) .... Take One Tablet Daily- Office Visit and Labs Due Now 3)  Mirena 20 Mcg/24hr Iud (Levonorgestrel) .... Iud 4)  Freestyle Lite   Strp (Glucose Blood) .... Accu Check Two Times A Day 5)  Freestyle Lancets   Misc (Lancets) .... As Directed 6)  Pravachol 40 Mg Tabs (Pravastatin Sodium) .... Take 1 By Mouth  At Bedtime 7)  Glucophage 1000 Mg Tabs (Metformin Hcl) .... Take One Tablet Twice Daily. 8)  Onetouch Lancets  Misc (Lancets) .... Pt Test 2 Times A Day 9)  Onetouch Lancets  Misc (Lancets) .... Pt Test 2 Times A Day 10)  Onetouch Ultra Test  Strp (Glucose Blood) .... Pt Test 2 Times A Day 11)  Ketoprofen .... As Needed 12)  Nasonex 50 Mcg/act Susp (Mometasone Furoate) .... 2 Sprays Each Nostril Once Daily 13)  Klonopin 0.5 Mg Tabs (Clonazepam) .Marland Kitchen.. 1 By Mouth Three Times A Day  Allergies (verified): No Known Drug Allergies  Past History:  Past Medical History: Last updated: 02/08/2009 Depression Hypertension Diabetes mellitus, type II (12/2006) Current Problems:  ACUTE SINUSITIS, UNSPECIFIED (ICD-461.9) FIBROIDS, UTERUS (ICD-218.9) BACTERIAL VAGINITIS (ICD-616.10) ROUTINE GYNECOLOGICAL EXAMINATION (ICD-V72.31) IUD (ICD-V25.1) FATIGUE (ICD-780.79) CARDIAC MURMUR  (ICD-785.2) PREVENTIVE HEALTH CARE (ICD-V70.0) FAMILY HISTORY DEPRESSION (ICD-V17.0) FAMILY HISTORY DIABETES 1ST DEGREE RELATIVE (ICD-V18.0) ANXIETY STATE, UNSPECIFIED (ICD-300.00) BACK PAIN, THORACIC REGION (ICD-724.1) BACK STRAIN, LUMBAR (ICD-847.2) OBESITY NOS (ICD-278.00) DM, UNCOMPLICATED, TYPE II (ICD-250.00) SCARLET FEVER (ICD-034.1) ABORTION (ICD-637.90) HYPERTENSION (ICD-401.9) DEPRESSION (ICD-311)  Past Surgical History: Last updated: 10/16/2009 Tonsillectomy mirena 2010  Family History: Last updated: 09/08/2008 Family History Diabetes 1st degree relative Family History Hypertension Family History Depression\par MGF---colon cancer 38yo  Social History: Last updated: 10/16/2009 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 Family History Diabetes 1st degree relative Family History Hypertension Family History Depression\par MGF---colon cancer 38yo  Social History: Reviewed history from 10/16/2009 and no changes required. 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 Lungs are clear to auscultation, no crackles or wheezes. Heart:  normal rate.   Genitalia:  normal introitus, no external lesions, no vaginal discharge, mucosa pink and moist, no vaginal or cervical lesions, no vaginal atrophy, no friaility or hemorrhage, normal  uterus size and position, and no adnexal masses or tenderness.  + IUD string visualized Extremities:  No clubbing, cyanosis, edema, or deformity noted with normal full range of motion of all joints.   Psych:  Oriented X3, normally interactive, not anxious appearing, and not depressed appearing.     Impression & Recommendations:  Problem # 1:  IUD (ICD-V25.1) + string visualized no other FB palpated or visualized  Problem # 2:  ANXIETY STATE, UNSPECIFIED (ICD-300.00)  The following medications were removed from the medication list:    Ativan 0.5 Mg Tabs (Lorazepam) .Marland Kitchen... 1 by mouth three times a day as needed Her updated medication list for this problem includes:    Celexa 40 Mg Tabs (Citalopram hydrobromide) .Marland Kitchen... 1 by mouth once daily    Klonopin 0.5 Mg Tabs (Clonazepam) .Marland Kitchen... 1 by mouth three times a day  Problem # 3:  DM, UNCOMPLICATED, TYPE II (ICD-250.00)  Her updated medication list for this problem includes:    Lisinopril-hydrochlorothiazide 10-12.5 Mg Tabs (Lisinopril-hydrochlorothiazide) .Marland Kitchen... Take one tablet daily- office visit and labs due now    Glucophage 1000 Mg Tabs (Metformin hcl) .Marland Kitchen... Take one tablet twice daily.  Labs Reviewed: Creat: 0.6 (10/16/2009)     Last Eye Exam: normal (09/26/2009) Reviewed HgBA1c results: 6.7 (10/16/2009)  7.7 (09/18/2008)  Problem # 4:  HYPERTENSION (ICD-401.9)  Her updated medication list for this problem includes:    Lisinopril-hydrochlorothiazide 10-12.5 Mg Tabs (Lisinopril-hydrochlorothiazide) .Marland Kitchen... Take one tablet daily- office visit and labs due now  BP today: 118/80 Prior BP: 124/82 (04/05/2010)  Labs Reviewed: K+: 3.3 (10/16/2009) Creat: : 0.6 (10/16/2009)   Chol: 131 (10/16/2009)   HDL: 42.00 (10/16/2009)   LDL: 72 (10/16/2009)   TG: 86.0 (10/16/2009)  Complete Medication List: 1)  Celexa 40 Mg Tabs (Citalopram hydrobromide) .Marland Kitchen.. 1 by mouth once daily 2)  Lisinopril-hydrochlorothiazide 10-12.5 Mg Tabs  (Lisinopril-hydrochlorothiazide) .... Take one tablet daily- office visit and labs due now 3)  Mirena 20 Mcg/24hr Iud (Levonorgestrel) .... Iud 4)  Freestyle Lite Strp (Glucose blood) .... Accu check two times a day 5)  Freestyle Lancets Misc (Lancets) .... As directed 6)  Pravachol 40 Mg Tabs (Pravastatin sodium) .... Take 1 by mouth  at bedtime 7)  Glucophage 1000 Mg Tabs (Metformin hcl) .... Take one tablet twice daily. 8)  Onetouch Lancets Misc (Lancets) .... Pt test 2 times a day 9)  Onetouch Lancets Misc (Lancets) .... Pt test 2 times a day 10)  Onetouch Ultra Test Strp (Glucose blood) .... Pt test 2 times a day 11)  Ketoprofen  .... As needed 12)  Nasonex 50 Mcg/act Susp (Mometasone furoate) .... 2 sprays each nostril once daily 13)  Klonopin 0.5 Mg Tabs (Clonazepam) .Marland Kitchen.. 1 by mouth three times a day  Patient Instructions: 1)  fasting labs  272.4  401.9  lipid, hep, bmp, hgba1c Prescriptions: KLONOPIN 0.5 MG TABS (CLONAZEPAM) 1 by mouth three times a day  #90 x 0   Entered and Authorized by:   Loreen Freud DO   Signed by:   Loreen Freud DO on 07/16/2010   Method used:   Print then Give to Patient   RxID:   [id number] NASONEX 50 MCG/ACT SUSP (MOMETASONE FUROATE) 2 sprays each nostril once daily  #3 x 3   Entered and Authorized by:   Loreen Freud DO   Signed by:   Loreen Freud DO on 07/16/2010   Method used:   Electronically to        [facility]  [address] [id number]* (retail)       [address]       [place], [place]  [address]       Ph: [phone] or [phone]       Fax: [phone]   RxID:   [id number] GLUCOPHAGE 1000 MG TABS (METFORMIN HCL) Take one tablet twice daily.  #90 x 3   Entered and Authorized by:   Loreen Freud DO   Signed by:   Loreen Freud DO on 07/16/2010   Method used:   Electronically to        [facility] [address] [id number]* (retail)       [address]       [place], [place]  [address]       Ph:  [phone] or [phone]       Fax: [phone]   RxID:   [id number] PRAVACHOL 40 MG TABS (PRAVASTATIN SODIUM) take 1 by mouth  at bedtime  #90 x 3   Entered and Authorized by:   Loreen Freud DO   Signed by:   Loreen Freud DO on 07/16/2010   Method used:   Electronically to        [facility] [id number]* (retail)       [address]       [place], [place]  [address]       Ph: [phone] or [phone]       Fax: [phone]   RxID:   [id number] LISINOPRIL-HYDROCHLOROTHIAZIDE 10-12.5 MG  TABS (LISINOPRIL-HYDROCHLOROTHIAZIDE) TAKE ONE TABLET DAILY- OFFICE VISIT AND LABS DUE NOW  #90 x 3   Entered and Authorized by:   Loreen Freud DO   Signed by:   Loreen Freud DO on 07/16/2010   Method used:   Electronically to        [facility]  [address] [id number]* (retail)       [address]       [place], [place]  [address]       Ph: [phone] or [phone]       Fax: [phone]   RxID:   [id number] CELEXA 40 MG  TABS (CITALOPRAM HYDROBROMIDE) 1 by mouth once daily  #90 x 3   Entered and Authorized by:   Loreen Freud DO   Signed by:   Loreen Freud DO on 07/16/2010   Method used:   Electronically to        [facility] [address] [id number]* (retail)       [address]       [place], [place]  [address]       Ph: [phone] or [phone]       Fax: [phone]   RxID:   [id number]    Orders Added: 1)  Est. Patient Level IV [22297]

## 2010-08-15 NOTE — Progress Notes (Signed)
Summary: nasonex --prior auth MED CHANGED FLONASE  Phone Note Refill Request Message from:  Fax from Pharmacy on July 24, 2010 2:58 PM  Refills Requested: Medication #1:  NASONEX 50 MCG/ACT SUSP 2 sprays each nostril once daily [id number], [address], [place], [place]   phone [phone]   fax - [phone]   qty =spray needs prior auth for this med listed on 1/3 office visit    Pt ID# [id number]    has [facility] for pateint insurance info, listed id#, but did not list phone number for [facility]  Initial call taken by: Jerolyn Shin,  July 24, 2010 3:00 PM  Follow-up for Phone Call        [facility] pharmacy to retrieve PA # [phone]..........Marland KitchenFelecia Deloach CMA  July 25, 2010 12:05 PM   Insurance will only allow 30 day supply at local pharmacy in addition quaintly must go through mail order.  Pt must have tried and fail the following for nasonex  to be approved: fluticasone, FLUNISOLIDE, triamcinolone. Pls advise...Marland KitchenMarland KitchenFelecia Deloach CMA  July 25, 2010 12:17 PM   Additional Follow-up for Phone Call Additional follow up Details #1::        can change to flonase if pt has not tried that in the past Additional Follow-up by: Loreen Freud DO,  July 25, 2010 1:50 PM    Additional Follow-up for Phone Call Additional follow up Details #2::    left message to call office...............Marland KitchenFelecia Deloach CMA  July 25, 2010 2:55 PM   Pt aware Rx sent to pharmacy......Marland KitchenFelecia Deloach CMA  July 25, 2010 3:48 PM   New/Updated Medications: FLONASE 50 MCG/ACT SUSP (FLUTICASONE PROPIONATE) 2 sprays each nostril once daily Prescriptions: FLONASE 50 MCG/ACT SUSP (FLUTICASONE PROPIONATE) 2 sprays each nostril once daily  #3 x 3   Entered by:   Jeremy Johann CMA   Authorized by:   Loreen Freud DO   Signed by:   Jeremy Johann CMA on 07/25/2010   Method used:   Faxed to ...       [facility]  [address] [id number]* (retail)       [address]  [place], [place]  [address]       Ph: [phone] or [phone]       Fax: [phone]   RxID:   [id number]

## 2010-08-15 NOTE — Letter (Signed)
Summary: Care Consideration Regarding A1C Monitoring/[employer]  Care Consideration Regarding A1C Monitoring/Active Health Mgmt   Imported By: Lanelle Bal 08/09/2010 09:44:13  _____________________________________________________________________  External Attachment:    Type:   Image     Comment:   External Document

## 2010-08-21 ENCOUNTER — Ambulatory Visit (INDEPENDENT_AMBULATORY_CARE_PROVIDER_SITE_OTHER): Payer: Self-pay | Admitting: Family Medicine

## 2010-08-21 ENCOUNTER — Encounter: Payer: Self-pay | Admitting: Family Medicine

## 2010-08-21 DIAGNOSIS — J069 Acute upper respiratory infection, unspecified: Secondary | ICD-10-CM | POA: Insufficient documentation

## 2010-08-21 LAB — CONVERTED CEMR LAB: Rapid Strep: NEGATIVE

## 2010-08-21 NOTE — Medication Information (Signed)
Summary: Formulary Letter Regarding Nasonex/[facility]  Formulary Letter Regarding Nasonex/[facility]   Imported By: Lanelle Bal 08/15/2010 09:39:52  _____________________________________________________________________  External Attachment:    Type:   Image     Comment:   External Document

## 2010-08-29 NOTE — Assessment & Plan Note (Signed)
Summary: sore throat/cbs   Vital Signs:  Patient profile:   40 year old female Weight:      236.8 pounds Temp:     98.4 degrees F oral BP sitting:   110 / 68  (left arm) Cuff size:   large  Vitals Entered By: Almeta Monas CMA Duncan Dull) (August 21, 2010 10:38 AM) CC: x2days c/o sore throat--now has a cough since this morning, URI symptoms   History of Present Illness:       This is a 40 year old woman who presents with URI symptoms.  The symptoms began 1 day ago.  The patient complains of sore throat, but denies nasal congestion, clear nasal discharge, purulent nasal discharge, dry cough, productive cough, earache, and sick contacts.  The patient denies fever, low-grade fever (<100.5 degrees), fever of 100.5-103 degrees, fever of 103.1-104 degrees, fever to >104 degrees, stiff neck, dyspnea, wheezing, rash, vomiting, diarrhea, use of an antipyretic, and response to antipyretic.  The patient denies itchy watery eyes, itchy throat, sneezing, seasonal symptoms, response to antihistamine, headache, muscle aches, and severe fatigue.  The patient denies the following risk factors for Strep sinusitis: unilateral facial pain, unilateral nasal discharge, poor response to decongestant, double sickening, tooth pain, Strep exposure, tender adenopathy, and absence of cough.    Current Medications (verified): 1)  Celexa 40 Mg  Tabs (Citalopram Hydrobromide) .Marland Kitchen.. 1 By Mouth Once Daily 2)  Lisinopril-Hydrochlorothiazide 10-12.5 Mg  Tabs (Lisinopril-Hydrochlorothiazide) .... Take One Tablet Daily- Office Visit and Labs Due Now 3)  Mirena 20 Mcg/24hr Iud (Levonorgestrel) .... Iud 4)  Freestyle Lite   Strp (Glucose Blood) .... Accu Check Two Times A Day 5)  Freestyle Lancets   Misc (Lancets) .... As Directed 6)  Pravachol 40 Mg Tabs (Pravastatin Sodium) .... Take 1 By Mouth  At Bedtime 7)  Glucophage 1000 Mg Tabs (Metformin Hcl) .... Take One Tablet Twice Daily. 8)  Onetouch Lancets  Misc (Lancets) .... Pt  Test 2 Times A Day 9)  Onetouch Lancets  Misc (Lancets) .... Pt Test 2 Times A Day 10)  Onetouch Ultra Test  Strp (Glucose Blood) .... Pt Test 2 Times A Day 11)  Ketoprofen .... As Needed 12)  Flonase 50 Mcg/act Susp (Fluticasone Propionate) .... 2 Sprays Each Nostril Once Daily 13)  Klonopin 0.5 Mg Tabs (Clonazepam) .Marland Kitchen.. 1 By Mouth Three Times A Day  Allergies (verified): No Known Drug Allergies  Past History:  Past medical, surgical, family and social histories (including risk factors) reviewed for relevance to current acute and chronic problems.  Past Medical History: Reviewed history from 02/08/2009 and no changes required. Depression Hypertension Diabetes mellitus, type II (12/2006) Current Problems:  ACUTE SINUSITIS, UNSPECIFIED (ICD-461.9) FIBROIDS, UTERUS (ICD-218.9) BACTERIAL VAGINITIS (ICD-616.10) ROUTINE GYNECOLOGICAL EXAMINATION (ICD-V72.31) IUD (ICD-V25.1) FATIGUE (ICD-780.79) CARDIAC MURMUR (ICD-785.2) PREVENTIVE HEALTH CARE (ICD-V70.0) FAMILY HISTORY DEPRESSION (ICD-V17.0) FAMILY HISTORY DIABETES 1ST DEGREE RELATIVE (ICD-V18.0) ANXIETY STATE, UNSPECIFIED (ICD-300.00) BACK PAIN, THORACIC REGION (ICD-724.1) BACK STRAIN, LUMBAR (ICD-847.2) OBESITY NOS (ICD-278.00) DM, UNCOMPLICATED, TYPE II (ICD-250.00) SCARLET FEVER (ICD-034.1) ABORTION (ICD-637.90) HYPERTENSION (ICD-401.9) DEPRESSION (ICD-311)  Past Surgical History: Reviewed history from 10/16/2009 and no changes required. Tonsillectomy mirena 2010  Family History: Reviewed history from 09/08/2008 and no changes required. Family History Diabetes 1st degree relative Family History Hypertension Family History Depression\par MGF---colon cancer 38yo  Social History: Reviewed history from 10/16/2009 and no changes required. 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  Otoscopic examination reveals clear canals, tympanic membranes are intact bilaterally without bulging, retraction, inflammation or discharge. Hearing is grossly normal bilaterally. Nose:  no external deformity, mucosal erythema, and mucosal edema.   Mouth:  pharyngeal erythema and pharyngeal exudate.   Neck:  No deformities, masses, or tenderness noted. Lungs:  Normal respiratory effort, chest expands symmetrically. Lungs are clear to auscultation, no crackles or wheezes. Heart:  Normal rate and regular rhythm. S1 and S2 normal without gallop, murmur, click, rub or other extra sounds. Extremities:  No clubbing, cyanosis, edema, or deformity noted with normal full range of motion of all joints.   Psych:  Cognition and judgment appear intact. Alert and cooperative with normal attention span and concentration. No apparent delusions, illusions, hallucinations   Impression & Recommendations:  Problem # 1:  URI (ICD-465.9) nasonex antihistamine call if symptoms worsens Instructed on symptomatic treatment. Call if symptoms persist or worsen.   Complete Medication List: 1)  Celexa 40 Mg Tabs (Citalopram hydrobromide) .Marland Kitchen.. 1 by mouth once daily 2)  Lisinopril-hydrochlorothiazide 10-12.5 Mg Tabs (Lisinopril-hydrochlorothiazide) .... Take one tablet daily- office visit and labs due now 3)  Mirena 20 Mcg/24hr Iud (Levonorgestrel) .... Iud 4)  Freestyle Lite Strp (Glucose blood) .... Accu check two times a day 5)  Freestyle Lancets Misc (Lancets) .... As directed 6)  Pravachol 40 Mg Tabs (Pravastatin sodium) .... Take 1 by mouth  at bedtime 7)  Glucophage 1000 Mg Tabs (Metformin hcl) .... Take one tablet twice daily. 8)  Onetouch Lancets Misc (Lancets) .... Pt test 2 times a day 9)  Onetouch Lancets Misc (Lancets) .... Pt  test 2 times a day 10)  Onetouch Ultra Test Strp (Glucose blood) .... Pt test 2 times a day 11)  Ketoprofen  .... As needed 12)  Flonase 50 Mcg/act Susp (Fluticasone propionate) .... 2 sprays each nostril once daily 13)  Klonopin 0.5 Mg Tabs (Clonazepam) .Marland Kitchen.. 1 by mouth three times a day  Patient Instructions: 1)  Get plenty of rest, drink lots of clear liquids, and use Tylenol or Ibuprofen for fever and comfort. Return in 7-10 days if you're not better: sooner if you'er feeling worse.    Orders Added: 1)  Est. Patient Level III [45409]    Laboratory Results    Other Tests  Rapid Strep: negative

## 2010-12-11 ENCOUNTER — Other Ambulatory Visit: Payer: Self-pay | Admitting: Family Medicine

## 2010-12-11 NOTE — Telephone Encounter (Signed)
Last seen 08/21/10 and filled 07/16/10 # 90---Please advised     KP

## 2010-12-12 MED ORDER — CLONAZEPAM 0.5 MG PO TABS: 0.5000 mg | ORAL_TABLET | Freq: Three times a day (TID) | ORAL | Status: DC | PRN

## 2010-12-12 NOTE — Telephone Encounter (Signed)
Rx approved by Dr.Lowne from home, will reorder so I can fax Rx to pharmacy    KP

## 2010-12-28 ENCOUNTER — Encounter: Payer: Self-pay | Admitting: Family Medicine

## 2011-01-01 ENCOUNTER — Other Ambulatory Visit: Payer: Self-pay | Admitting: Family Medicine

## 2011-01-01 DIAGNOSIS — I10 Essential (primary) hypertension: Secondary | ICD-10-CM

## 2011-01-01 DIAGNOSIS — E785 Hyperlipidemia, unspecified: Secondary | ICD-10-CM

## 2011-01-02 ENCOUNTER — Other Ambulatory Visit (INDEPENDENT_AMBULATORY_CARE_PROVIDER_SITE_OTHER): Payer: BC Managed Care – PPO

## 2011-01-02 DIAGNOSIS — I10 Essential (primary) hypertension: Secondary | ICD-10-CM

## 2011-01-02 DIAGNOSIS — E785 Hyperlipidemia, unspecified: Secondary | ICD-10-CM

## 2011-01-02 LAB — BASIC METABOLIC PANEL
BUN: 11 mg/dL (ref 6–23)
Chloride: 99 mEq/L (ref 96–112)
GFR: 153.05 mL/min (ref >60.00)
Potassium: 4 mEq/L (ref 3.5–5.1)
Sodium: 135 mEq/L (ref 135–145)

## 2011-01-02 LAB — HEPATIC FUNCTION PANEL
ALT: 20 U/L (ref 0–35)
Bilirubin, Direct: 0.1 mg/dL (ref 0.0–0.3)
Total Bilirubin: 0.5 mg/dL (ref 0.3–1.2)
Total Protein: 7 g/dL (ref 6.0–8.3)

## 2011-01-02 LAB — LIPID PANEL
Cholesterol: 154 mg/dL (ref 0–200)
HDL: 39.4 mg/dL (ref >39.00)
LDL Cholesterol: 102 mg/dL — ABNORMAL HIGH (ref 0–99)
VLDL: 12.4 mg/dL (ref 0.0–40.0)

## 2011-01-02 LAB — HEMOGLOBIN A1C: Hgb A1c MFr Bld: 7.6 % — ABNORMAL HIGH (ref 4.6–6.5)

## 2011-01-02 NOTE — Progress Notes (Signed)
Labs only

## 2011-01-03 IMAGING — CR DG KNEE COMPLETE 4+V*R*
4 series · 4 of 4 positions shown · non-contrast
Comparison: None available.

CLINICAL DATA: Knee pain.

RIGHT KNEE - COMPLETE 4+ VIEW

[t knee ap right (1 of 2)]
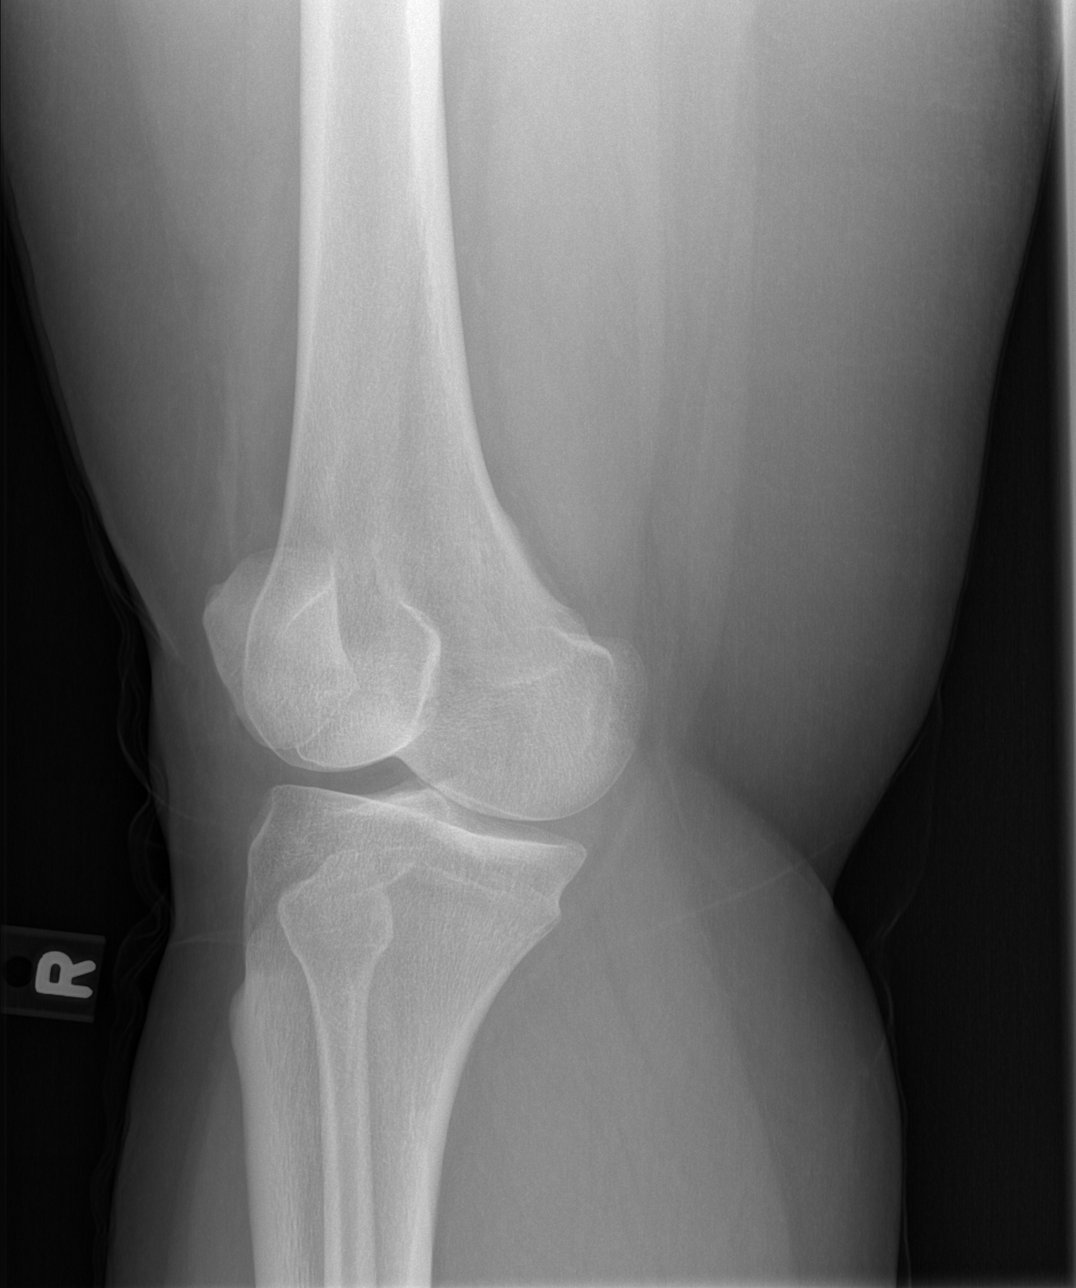

[t knee ap right (2 of 2)]
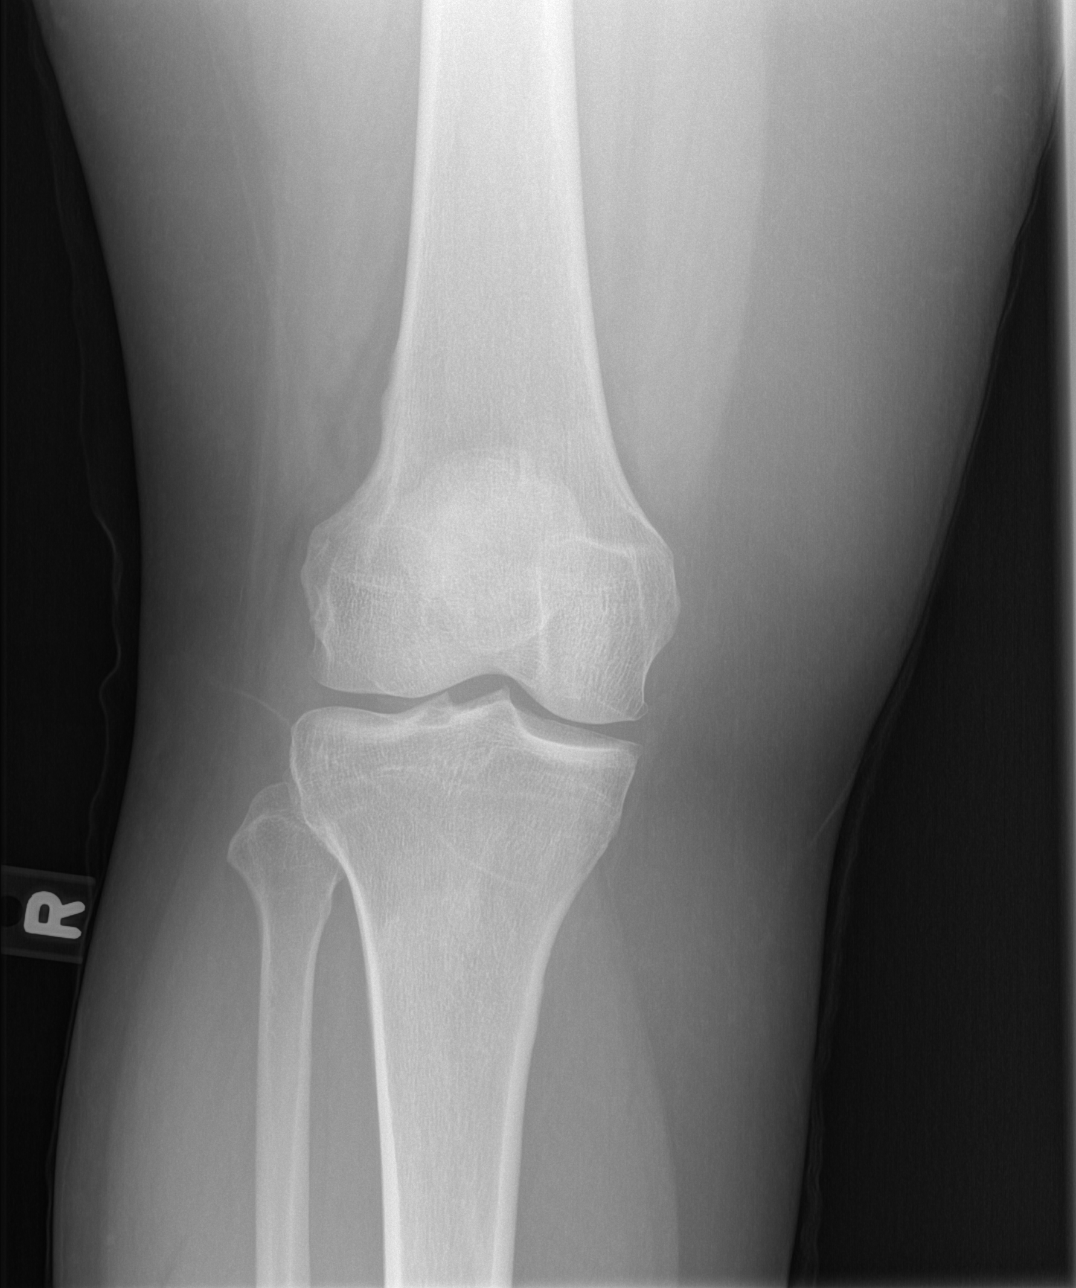

[t knee oblique right]
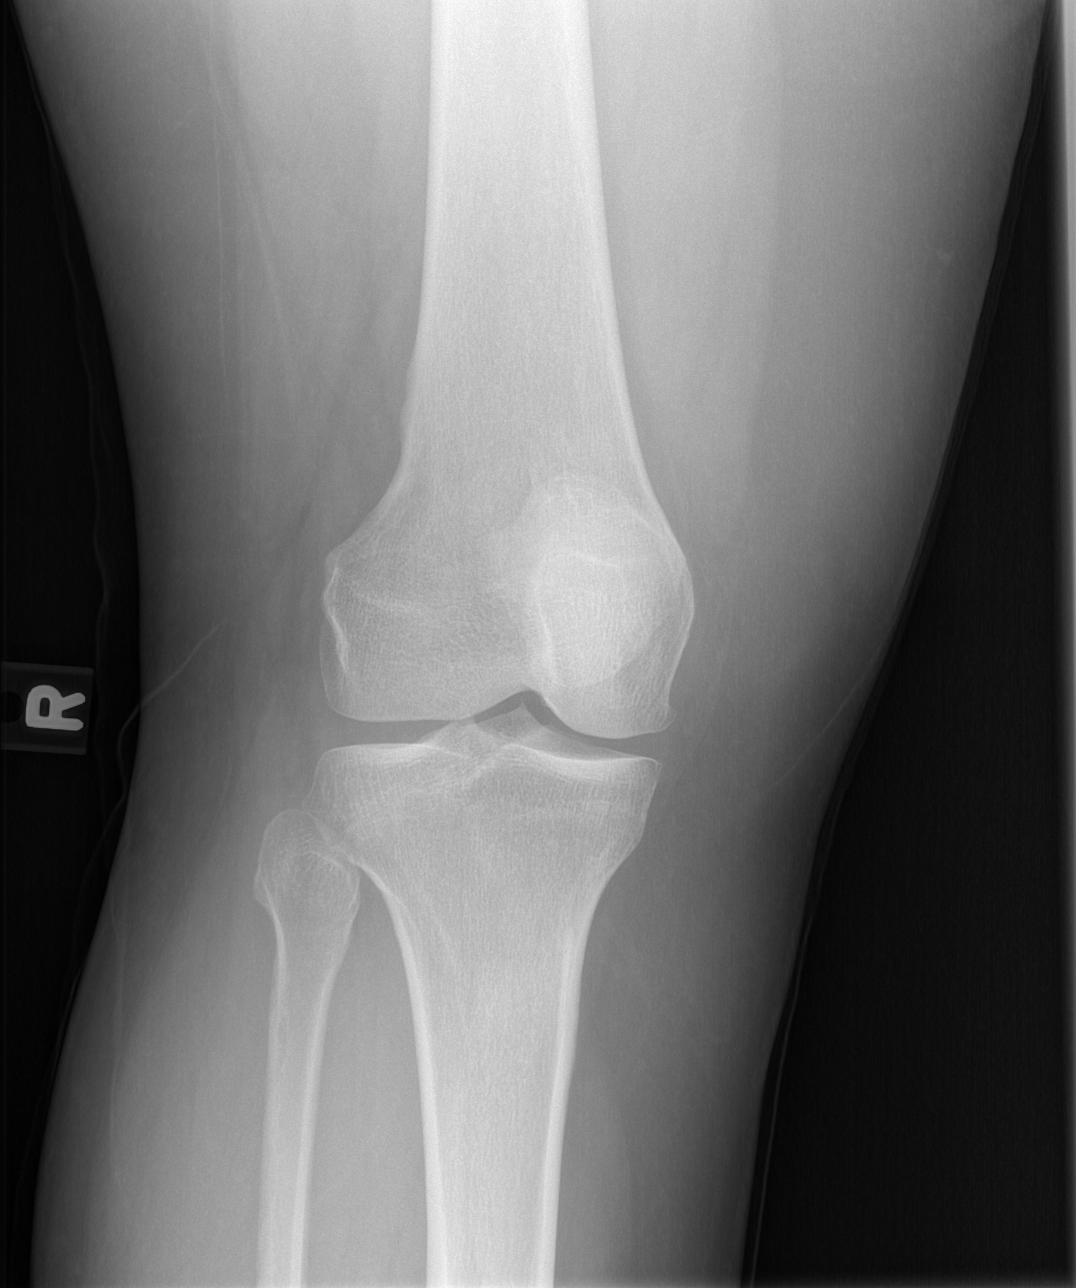

[view not recorded]
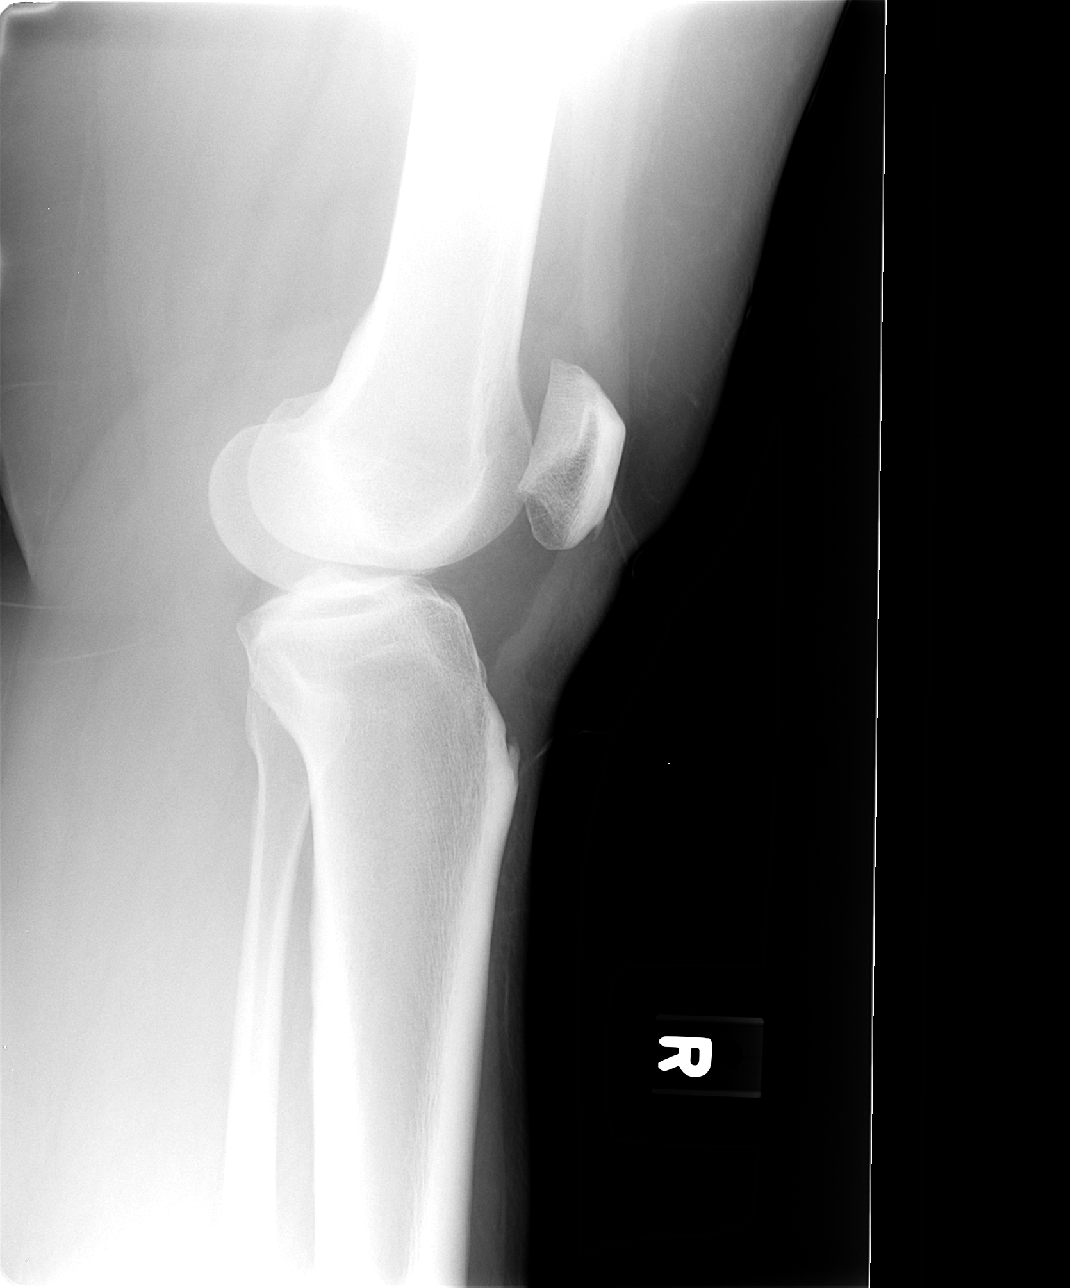

[4 of 4 positions shown; findings below may reference images not displayed]

FINDINGS: Small osteophytes are noted in the medial and
patellofemoral compartments.  There is no fracture, dislocation or
joint effusion.
IMPRESSION: 1.  No acute finding.
2.  Mild medial and patellofemoral degenerative disease.

## 2011-01-07 ENCOUNTER — Encounter: Payer: Self-pay | Admitting: *Deleted

## 2011-01-07 ENCOUNTER — Telehealth: Payer: Self-pay | Admitting: *Deleted

## 2011-01-07 MED ORDER — SAXAGLIPTIN-METFORMIN ER 5-1000 MG PO TB24: 1.0000 | ORAL_TABLET | Freq: Every evening | ORAL | Status: DC

## 2011-01-07 MED ORDER — ATORVASTATIN CALCIUM 10 MG PO TABS: 10.0000 mg | ORAL_TABLET | Freq: Every day | ORAL | Status: DC

## 2011-01-07 NOTE — Telephone Encounter (Signed)
Message copied by Verdene Rio on Tue Jan 07, 2011  2:59 PM ------      Message from: Lelon Perla      Created: Mon Jan 06, 2011 10:28 PM       Cholesterol--- LDL goal < 100,  HDL >50,  TG < 150.  Diet and exercise will increase HDL and decrease LDL and TG.  Fish,  Fish Oil, Flaxseed oil will also help increase the HDL and decrease Triglycerides.  Change pravachol to Lipitor 10 mg #30  1po qhs,  2 refills.. Recheck labs in  3 months.      DM also not controlled----change metformin to kombiglyza  11/998 #30  1 po qd in evening  2 refills             250.00  272.4  Lipid hep, bmp, hgba1c, microalbumin

## 2011-01-07 NOTE — Telephone Encounter (Signed)
Pt aware, Rx sent to pharmacy, copy of labs mailed. 

## 2011-01-16 ENCOUNTER — Encounter: Payer: BC Managed Care – PPO | Admitting: Family Medicine

## 2011-01-20 ENCOUNTER — Ambulatory Visit: Payer: BC Managed Care – PPO | Admitting: Family Medicine

## 2011-02-28 ENCOUNTER — Encounter: Payer: Self-pay | Admitting: Family Medicine

## 2011-02-28 ENCOUNTER — Other Ambulatory Visit (HOSPITAL_COMMUNITY)
Admission: RE | Admit: 2011-02-28 | Discharge: 2011-02-28 | Disposition: A | Discharge status: Home / Self Care | Payer: BC Managed Care – PPO | Source: Ambulatory Visit | Attending: Family Medicine | Admitting: Family Medicine

## 2011-02-28 ENCOUNTER — Ambulatory Visit (INDEPENDENT_AMBULATORY_CARE_PROVIDER_SITE_OTHER): Payer: BC Managed Care – PPO | Admitting: Family Medicine

## 2011-02-28 VITALS — BP 124/84 | HR 88 | Temp 99.3°F | Resp 20 | Ht 64.0 in | Wt 229.0 lb

## 2011-02-28 DIAGNOSIS — Z113 Encounter for screening for infections with a predominantly sexual mode of transmission: Secondary | ICD-10-CM | POA: Insufficient documentation

## 2011-02-28 DIAGNOSIS — I10 Essential (primary) hypertension: Secondary | ICD-10-CM

## 2011-02-28 DIAGNOSIS — Z01419 Encounter for gynecological examination (general) (routine) without abnormal findings: Secondary | ICD-10-CM | POA: Insufficient documentation

## 2011-02-28 DIAGNOSIS — E119 Type 2 diabetes mellitus without complications: Secondary | ICD-10-CM

## 2011-02-28 DIAGNOSIS — Z0000 Encounter for general adult medical examination without abnormal findings: Secondary | ICD-10-CM

## 2011-02-28 DIAGNOSIS — E785 Hyperlipidemia, unspecified: Secondary | ICD-10-CM

## 2011-02-28 LAB — POCT URINALYSIS DIPSTICK
Bilirubin, UA: NEGATIVE
Blood, UA: NEGATIVE
Glucose, UA: NEGATIVE
Leukocytes, UA: NEGATIVE
Nitrite, UA: NEGATIVE
Urobilinogen, UA: 0.2
pH, UA: 8

## 2011-02-28 NOTE — Progress Notes (Signed)
Addended by: Candie Echevaria L on: 02/28/2011 05:57 PM   Modules accepted: Orders

## 2011-02-28 NOTE — Progress Notes (Signed)
  Subjective:     Jasmine Hinton is a 40 y.o. woman who comes in today for a  pap smear only. Her most recent annual exam was 10/2009 . Her most recent Pap smear was on 2 years ago and showed no abnormalities. Previous abnormal Pap smears: no. Contraception: IUD  The following portions of the patient's history were reviewed and updated as appropriate: allergies, current medications, past family history, past medical history, past social history, past surgical history and problem list.  Review of Systems Review of Systems  Constitutional: Negative for activity change, appetite change and fatigue.  HENT: Negative for hearing loss, congestion, tinnitus and ear discharge.  dentist q83m Eyes: Negative for visual disturbance (see optho q1y -- vision corrected to 20/20 with glasses).  Respiratory: Negative for cough, chest tightness and shortness of breath.   Cardiovascular: Negative for chest pain, palpitations and leg swelling.  Gastrointestinal: Negative for abdominal pain, diarrhea, constipation and abdominal distention.  Genitourinary: Negative for urgency, frequency, decreased urine volume and difficulty urinating.  Musculoskeletal: Negative for back pain, arthralgias and gait problem.  Skin: Negative for color change, pallor and rash.  Neurological: Negative for dizziness, light-headedness, numbness and headaches.  Hematological: Negative for adenopathy. Does not bruise/bleed easily.  Psychiatric/Behavioral: Negative for suicidal ideas, confusion, sleep disturbance, self-injury, dysphoric mood, decreased concentration and agitation.       Objective:    BP 124/84  Pulse 88  Temp(Src) 99.3 F (37.4 C) (Oral)  Resp 20  Ht 5\' 4"  (1.626 m)  Wt 229 lb (103.874 kg)  BMI 39.31 kg/m2  LMP 02/12/2011 Pelvic Exam: cervix normal in appearance, external genitalia normal and vagina normal without discharge. Pap smear obtained.  Breast exam --no masses, no nipple d/c, no dimpling, no axillary  nodes Assessment:    gyn exam  Plan:    Follow up in 1 year, or as indicated by Pap results.

## 2011-03-01 LAB — RPR

## 2011-03-03 LAB — HSV 2 ANTIBODY, IGG: HSV 2 Glycoprotein G Ab, IgG: 0.21 IV

## 2011-03-04 ENCOUNTER — Encounter: Payer: Self-pay | Admitting: *Deleted

## 2011-04-15 ENCOUNTER — Other Ambulatory Visit: Payer: Self-pay | Admitting: Family Medicine

## 2011-04-15 NOTE — Telephone Encounter (Signed)
Last seen 02/28/11 and filled 12/12/10 # 90. Please advise    KP

## 2011-04-15 NOTE — Telephone Encounter (Signed)
Faxed.   KP 

## 2011-04-28 ENCOUNTER — Other Ambulatory Visit: Payer: Self-pay | Admitting: Family Medicine

## 2011-05-06 ENCOUNTER — Other Ambulatory Visit: Payer: Self-pay | Admitting: Family Medicine

## 2011-05-06 MED ORDER — CITALOPRAM HYDROBROMIDE 40 MG PO TABS: 40.0000 mg | ORAL_TABLET | Freq: Every day | ORAL | Status: DC

## 2011-05-06 MED ORDER — ATORVASTATIN CALCIUM 10 MG PO TABS: 10.0000 mg | ORAL_TABLET | Freq: Every day | ORAL | Status: DC

## 2011-05-06 NOTE — Telephone Encounter (Signed)
Faxed.   KP 

## 2011-05-08 ENCOUNTER — Other Ambulatory Visit: Payer: Self-pay | Admitting: Family Medicine

## 2011-05-08 MED ORDER — ATORVASTATIN CALCIUM 10 MG PO TABS: 10.0000 mg | ORAL_TABLET | Freq: Every day | ORAL | Status: DC

## 2011-05-08 NOTE — Telephone Encounter (Signed)
Letter mailed to schedule and appt    KP

## 2011-05-12 ENCOUNTER — Encounter: Payer: BC Managed Care – PPO | Admitting: Internal Medicine

## 2011-05-13 ENCOUNTER — Ambulatory Visit: Payer: BC Managed Care – PPO | Admitting: Internal Medicine

## 2011-06-09 ENCOUNTER — Other Ambulatory Visit: Payer: Self-pay | Admitting: Family Medicine

## 2011-06-16 ENCOUNTER — Telehealth: Payer: Self-pay | Admitting: Family Medicine

## 2011-06-16 NOTE — Telephone Encounter (Signed)
Please advise      KP 

## 2011-06-16 NOTE — Telephone Encounter (Signed)
If she brings in formulary --I can make sure I pick one on her ins.   Or she can look to see if janumet or janumet xr is on there

## 2011-06-16 NOTE — Telephone Encounter (Signed)
Discussed with patient and she fax it over tomorrow    KP

## 2011-06-25 ENCOUNTER — Encounter: Payer: Self-pay | Admitting: Family Medicine

## 2011-06-25 ENCOUNTER — Ambulatory Visit (INDEPENDENT_AMBULATORY_CARE_PROVIDER_SITE_OTHER): Payer: BC Managed Care – PPO | Admitting: Family Medicine

## 2011-06-25 VITALS — BP 118/78 | HR 81 | Temp 99.1°F | Wt 234.0 lb

## 2011-06-25 DIAGNOSIS — Z7251 High risk heterosexual behavior: Secondary | ICD-10-CM

## 2011-06-25 NOTE — Progress Notes (Signed)
  Subjective:    Patient ID: Jasmine Hinton, female    DOB: Dec 01, 1970, 40 y.o.   MRN: [id number]  HPI Pt here to have HSV test done.  She gave oral sex to someone last month and he found out he was HSV +.  No other complaints.      Review of Systems    as above Objective:   Physical Exam  Constitutional: She is oriented to person, place, and time. She appears well-developed and well-nourished.  Neurological: She is alert and oriented to person, place, and time.  Psychiatric: She has a normal mood and affect. Her behavior is normal. Judgment and thought content normal.          Assessment & Plan:  High risk sexual behavior------check HSV----pt did not want anything else checked.  She said everything else was done already

## 2011-06-27 ENCOUNTER — Other Ambulatory Visit: Payer: Self-pay | Admitting: Family Medicine

## 2011-06-30 ENCOUNTER — Telehealth: Payer: Self-pay | Admitting: Family Medicine

## 2011-06-30 MED ORDER — SITAGLIP PHOS-METFORMIN HCL ER 100-1000 MG PO TB24: 1.0000 | ORAL_TABLET | Freq: Every day | ORAL | Status: DC

## 2011-06-30 NOTE — Telephone Encounter (Signed)
Change to janumet xr 100/1000 1 po qpm #90 1 refills---can leave samples up front

## 2011-06-30 NOTE — Telephone Encounter (Signed)
Faxed.   KP 

## 2011-06-30 NOTE — Telephone Encounter (Signed)
Patients diabetes  med was changed - ins wouldn't pay for it -patient faxed formulary - [facility] [address]

## 2011-06-30 NOTE — Telephone Encounter (Signed)
Please advise      KP 

## 2011-08-01 ENCOUNTER — Other Ambulatory Visit: Payer: Self-pay | Admitting: Family Medicine

## 2011-08-01 MED ORDER — SITAGLIP PHOS-METFORMIN HCL ER 100-1000 MG PO TB24: 1.0000 | ORAL_TABLET | Freq: Every day | ORAL | Status: DC

## 2011-08-01 NOTE — Telephone Encounter (Signed)
Letter mailed     KP 

## 2011-08-01 NOTE — Telephone Encounter (Signed)
Janumet XR [phone] mg tablet

## 2011-08-02 ENCOUNTER — Other Ambulatory Visit: Payer: Self-pay | Admitting: Family Medicine

## 2011-08-05 ENCOUNTER — Other Ambulatory Visit: Payer: Self-pay | Admitting: Family Medicine

## 2011-08-05 MED ORDER — SITAGLIP PHOS-METFORMIN HCL ER 100-1000 MG PO TB24: 1.0000 | ORAL_TABLET | Freq: Every day | ORAL | Status: DC

## 2011-08-05 NOTE — Telephone Encounter (Signed)
Letter mailed to schedule lab apt   KP

## 2011-09-18 ENCOUNTER — Ambulatory Visit (INDEPENDENT_AMBULATORY_CARE_PROVIDER_SITE_OTHER): Payer: BC Managed Care – PPO | Admitting: Family Medicine

## 2011-09-18 ENCOUNTER — Encounter: Payer: Self-pay | Admitting: Family Medicine

## 2011-09-18 DIAGNOSIS — F411 Generalized anxiety disorder: Secondary | ICD-10-CM

## 2011-09-18 DIAGNOSIS — F418 Other specified anxiety disorders: Secondary | ICD-10-CM

## 2011-09-18 DIAGNOSIS — F341 Dysthymic disorder: Secondary | ICD-10-CM

## 2011-09-18 DIAGNOSIS — E785 Hyperlipidemia, unspecified: Secondary | ICD-10-CM

## 2011-09-18 DIAGNOSIS — E119 Type 2 diabetes mellitus without complications: Secondary | ICD-10-CM

## 2011-09-18 DIAGNOSIS — F419 Anxiety disorder, unspecified: Secondary | ICD-10-CM

## 2011-09-18 LAB — BASIC METABOLIC PANEL
BUN: 8 mg/dL (ref 6–23)
Calcium: 9.2 mg/dL (ref 8.4–10.5)
Creatinine, Ser: 0.6 mg/dL (ref 0.4–1.2)
GFR: 133.4 mL/min (ref >60.00)
Glucose, Bld: 107 mg/dL — ABNORMAL HIGH (ref 70–99)

## 2011-09-18 LAB — LIPID PANEL
Cholesterol: 169 mg/dL (ref 0–200)
LDL Cholesterol: 115 mg/dL — ABNORMAL HIGH (ref 0–99)
Total CHOL/HDL Ratio: 4

## 2011-09-18 LAB — HEPATIC FUNCTION PANEL
Alkaline Phosphatase: 59 U/L (ref 39–117)
Bilirubin, Direct: 0.1 mg/dL (ref 0.0–0.3)
Total Bilirubin: 0.6 mg/dL (ref 0.3–1.2)
Total Protein: 7.5 g/dL (ref 6.0–8.3)

## 2011-09-18 LAB — MICROALBUMIN / CREATININE URINE RATIO: Microalb, Ur: 2.6 mg/dL — ABNORMAL HIGH (ref 0.0–1.9)

## 2011-09-18 MED ORDER — CITALOPRAM HYDROBROMIDE 10 MG PO TABS: 10.0000 mg | ORAL_TABLET | Freq: Every day | ORAL | Status: DC

## 2011-09-18 MED ORDER — CLONAZEPAM 1 MG PO TABS: ORAL_TABLET | ORAL | Status: DC

## 2011-09-18 MED ORDER — VILAZODONE HCL 10 & 20 & 40 MG PO KIT: 1.0000 | PACK | ORAL | Status: DC

## 2011-09-18 NOTE — Progress Notes (Signed)
  Subjective:   Jasmine Hinton is an 41 y.o. female who presents for evaluation and treatment of depressive symptoms.  Onset approximately 1 month ago, gradually worsening since that time.  Current symptoms include depressed mood, insomnia, fatigue, feelings of worthlessness/guilt, difficulty concentrating, anxiety, panic attacks, insomnia, loss of energy/fatigue,.  Current treatment for depression:Individual therapy and Medication Sleep problems: Marked   Early awakening:Absent   Energy: Poor Motivation: Poor Concentration: Poor Rumination/worrying: Severe Memory: Good Tearfulness: Mild  Anxiety: Severe  Panic: Severe  Overall Mood: Moderately worse  Hopelessness: Moderate Suicidal ideation: Mild  Other/Psychosocial Stressors: husband left her Family history positive for depression in the patient's unknown.  Previous treatment modalities employed include Individual therapy and Medication.  Past episodes of depression:yes Organic causes of depression present: None.  Review of Systems Pertinent items are noted in HPI.   Objective:   Mental Status Examination: Posture and motor behavior: Appropriate Dress, grooming, personal hygiene: Appropriate Facial expression: Appropriate Speech: Appropriate Mood: Appropriate Coherency and relevance of thought: Appropriate Thought content: Appropriate Perceptions: Appropriate Orientation:Appropriate Attention and concentration: Appropriate Memory: : Appropriate Information: Not examined Vocabulary: Appropriate Abstract reasoning: Appropriate Judgment: Appropriate    Assessment:   Experiencing the following symptoms of depression most of the day nearly every day for more than two consecutive weeks: depressed mood  Depressive Disorder:with anxeity  Suicide Risk Assessment:  Suicidal intent: no Suicidal plan: no Access to means for suicide: no Lethality of means for suicide: no Prior suicide attempts: no Recent  exposure to suicide:no   Plan:    1. Anxiety    2. Depression with anxiety  clonazePAM (KLONOPIN) 1 MG tablet, citalopram (CELEXA) 10 MG tablet  3. Diabetes mellitus  Hemoglobin A1c, Basic metabolic panel, Microalbumin / creatinine urine ratio  4. Hyperlipidemia  Lipid panel, Hepatic function panel    Reviewed concept of depression as biochemical imbalance of neurotransmitters and rationale for treatment. Instructed patient to contact office or on-call physician promptly should condition worsen or any new symptoms appear and provided on-call telephone numbers.

## 2011-09-18 NOTE — Patient Instructions (Signed)

## 2011-09-26 MED ORDER — ATORVASTATIN CALCIUM 20 MG PO TABS: 20.0000 mg | ORAL_TABLET | Freq: Every day | ORAL | Status: DC

## 2011-09-26 MED ORDER — SITAGLIP PHOS-METFORMIN HCL ER 50-1000 MG PO TB24: 2.0000 | ORAL_TABLET | Freq: Every day | ORAL | Status: DC

## 2011-10-29 ENCOUNTER — Other Ambulatory Visit: Payer: Self-pay | Admitting: Family Medicine

## 2011-11-03 ENCOUNTER — Telehealth: Payer: Self-pay | Admitting: Family Medicine

## 2011-11-03 MED ORDER — SITAGLIP PHOS-METFORMIN HCL ER 50-1000 MG PO TB24: 2.0000 | ORAL_TABLET | Freq: Every day | ORAL | Status: DC

## 2011-11-03 MED ORDER — VILAZODONE HCL 40 MG PO TABS: 40.0000 mg | ORAL_TABLET | Freq: Every day | ORAL | Status: DC

## 2011-11-03 MED ORDER — ATORVASTATIN CALCIUM 20 MG PO TABS: 20.0000 mg | ORAL_TABLET | Freq: Every day | ORAL | Status: DC

## 2011-11-03 NOTE — Telephone Encounter (Signed)
Ok for refills on Atorvastatin, Janumet XR.  NO to citalopram as pt is now taking Viibryd.  Can have Viibryd 40mg , 1 tab daily, #30, no refills.  Needs f/u appt w/ Dr Laury Axon

## 2011-11-03 NOTE — Telephone Encounter (Signed)
Refill: Atorvastatin 20mg  tablet. 90 day supply  Citalopram hbr 10mg  tablet. 90 day supply  Janumet XR 50-1,000 mg tablet. 90 day supply

## 2011-11-03 NOTE — Telephone Encounter (Signed)
Pt called back stating that she has finished sample pack of Vilazodone HCl (VIIBRYD) 10 & 20 & 40 MG KIT and would like to know if she needs to continue on this med. Spoke with Pt advise her that she was due to f/u with Dr Laury Axon in 1 month per her instruction in reference to med. Pt is now request another 30 day supply of med to see if she would like to continue with it. Pt notes that med has been working so far but she was also taking it in combination with weaning off other med so does not really know how it will work along.Marland KitchenPlease advise   [facility] [address]

## 2011-11-13 ENCOUNTER — Telehealth: Payer: Self-pay | Admitting: Family Medicine

## 2011-11-13 MED ORDER — VILAZODONE HCL 40 MG PO TABS: 40.0000 mg | ORAL_TABLET | Freq: Every day | ORAL | Status: DC

## 2011-11-13 NOTE — Telephone Encounter (Signed)
refill for Viibryd 40MG  Tablet Qty 90  Last ov 03.07.13 Last filled at [facility]  Last Instructions Take 1 tablet (40 mg total) by mouth daily   [facility]

## 2011-11-25 ENCOUNTER — Telehealth: Payer: Self-pay | Admitting: *Deleted

## 2011-11-25 NOTE — Telephone Encounter (Signed)
Stop viibryd--- can try samples of pristiq But I think she should make appointment with psychiatrist so we can get some help with the medication---please give her list of psych

## 2011-11-25 NOTE — Telephone Encounter (Signed)
Pt c/o increase insomnia, panic attack and being very nervous since starting Vilazodone VIIBRYD 40 MG. Pt thinks that med may be possible interacting with the clonazepam that she is taking. .Please advise

## 2011-11-25 NOTE — Telephone Encounter (Signed)
Discuss with patient, samples and psych list place up front for pickup.

## 2011-11-25 NOTE — Telephone Encounter (Signed)
msg left to call the office     KP 

## 2011-12-10 ENCOUNTER — Telehealth: Payer: Self-pay | Admitting: *Deleted

## 2011-12-10 NOTE — Telephone Encounter (Signed)
pristiq 50 mg #30  1 po qd  5 refills

## 2011-12-10 NOTE — Telephone Encounter (Signed)
Patient called and left voice message stating she was given a sample card for Pristiq, however she did not receive a Rx for the medication. She would like to know if a Rx could be sent to pharmacy on file.

## 2011-12-11 MED ORDER — DESVENLAFAXINE SUCCINATE ER 50 MG PO TB24: 50.0000 mg | ORAL_TABLET | Freq: Every day | ORAL | Status: DC

## 2011-12-11 NOTE — Telephone Encounter (Signed)
Rx faxed.    KP 

## 2012-02-04 ENCOUNTER — Telehealth: Payer: Self-pay | Admitting: *Deleted

## 2012-02-04 MED ORDER — CITALOPRAM HYDROBROMIDE 10 MG PO TABS: 10.0000 mg | ORAL_TABLET | Freq: Every day | ORAL | Status: DC

## 2012-02-04 NOTE — Telephone Encounter (Signed)
Pt states that PRISTIQ) 50 is too expensive and would like to be switch back to the celexa. .Please advise

## 2012-02-04 NOTE — Telephone Encounter (Signed)
VM left advising the patient Rx faxed    KP

## 2012-02-04 NOTE — Telephone Encounter (Signed)
celexa 10 mg #30  1 po qd  2 refills 

## 2012-02-17 ENCOUNTER — Other Ambulatory Visit: Payer: Self-pay | Admitting: Family Medicine

## 2012-02-23 ENCOUNTER — Telehealth: Payer: Self-pay | Admitting: Family Medicine

## 2012-02-23 MED ORDER — SITAGLIP PHOS-METFORMIN HCL ER 50-1000 MG PO TB24: 2.0000 | ORAL_TABLET | Freq: Every day | ORAL | Status: DC

## 2012-02-23 NOTE — Telephone Encounter (Signed)
NEW prescription fax received for Janumet XR-50-1,000MG  tablet , requesting 90-day supply wt/4-refills Not seen on current  Or past medications list LOV  3.7.13 Anxiety follow up

## 2012-04-16 ENCOUNTER — Telehealth: Payer: Self-pay | Admitting: Family Medicine

## 2012-04-16 MED ORDER — CITALOPRAM HYDROBROMIDE 20 MG PO TABS: 10.0000 mg | ORAL_TABLET | Freq: Every day | ORAL | Status: DC

## 2012-04-16 NOTE — Telephone Encounter (Signed)
Requesting 20 mg of Celexa  so the patient can get it cheaper, she will be cutting the 20 mg in half to make it a10 mg dose. Please advise      KP

## 2012-04-16 NOTE — Telephone Encounter (Signed)
Refill: Citalopram 20. 1/2 qd. #30.  *Pharmacy note: pt has been taking citalopram 10mg  t qd. Our citalopram 20mg  is $4.00. Can you change it? Please, thanks.

## 2012-04-16 NOTE — Telephone Encounter (Signed)
ok 

## 2012-05-21 ENCOUNTER — Telehealth: Payer: Self-pay | Admitting: Family Medicine

## 2012-05-21 MED ORDER — SITAGLIP PHOS-METFORMIN HCL ER 50-1000 MG PO TB24: 2.0000 | ORAL_TABLET | Freq: Every day | ORAL | Status: DC

## 2012-05-21 MED ORDER — ATORVASTATIN CALCIUM 20 MG PO TABS: 20.0000 mg | ORAL_TABLET | Freq: Every day | ORAL | Status: DC

## 2012-05-21 NOTE — Telephone Encounter (Signed)
Refill: Metformin 1000mg  1 BID. #60 or 180  Lipitor 20 mg. Tqd. #30

## 2012-05-28 ENCOUNTER — Telehealth: Payer: Self-pay | Admitting: Family Medicine

## 2012-05-28 NOTE — Telephone Encounter (Signed)
Metformin was not enough---we need to see her formulary

## 2012-05-28 NOTE — Telephone Encounter (Signed)
Please advise      KP 

## 2012-05-28 NOTE — Telephone Encounter (Signed)
Viibryd is for depression and metformin is for diabetes ---they are not interchangeable

## 2012-05-28 NOTE — Telephone Encounter (Signed)
This patient is on Janumet 50-1000 2 po qd. Please advise     KP

## 2012-05-28 NOTE — Telephone Encounter (Signed)
msg left to call the office     KP 

## 2012-05-28 NOTE — Telephone Encounter (Signed)
Caller: Jasmine Hinton/Patient; Phone: [phone]; Reason for Call: Wanting to switch back to Metformin since Insurance does not cover Viibryd and she out of medication.  Please ask Dr.  Laury Axon to call in [facility] on [address].

## 2012-06-01 MED ORDER — METFORMIN HCL 1000 MG PO TABS: 1000.0000 mg | ORAL_TABLET | Freq: Two times a day (BID) | ORAL | Status: DC

## 2012-06-01 NOTE — Telephone Encounter (Signed)
Problem is she was supposed to have labs in June and we changed metformin because it was not enough to get her sugars controlled.   We can change to metformin 1000 mg bid but she needs to have labs.    Will ins kick in DEC1st?

## 2012-06-01 NOTE — Telephone Encounter (Signed)
Mid Dec.     KP

## 2012-06-01 NOTE — Telephone Encounter (Signed)
We need to make sure she has appointment as soon as ins kicks in ---make appointment now and send metformin in.

## 2012-06-01 NOTE — Telephone Encounter (Signed)
Discussed with patient, apt scheduled and Rx sent for Metformin 1000 1 po bid as previously prescribed    KP

## 2012-06-01 NOTE — Telephone Encounter (Signed)
Patient does not have any insurance and she wanted to go back to the Metformin since it is 5 dollars at [employer]. Per patient her BS have been good and she did not want to go without medication.  She will et her insurance back in mid December and will schedule a follow up then. Please advise          KP

## 2012-06-01 NOTE — Telephone Encounter (Signed)
Pt LMOVM returning your call requesting CB. CB: [phone]

## 2012-06-02 ENCOUNTER — Telehealth: Payer: Self-pay | Admitting: Family Medicine

## 2012-06-02 MED ORDER — PRAVASTATIN SODIUM 40 MG PO TABS: 40.0000 mg | ORAL_TABLET | Freq: Every day | ORAL | Status: DC

## 2012-06-02 NOTE — Telephone Encounter (Signed)
Rx faxed.    KP 

## 2012-06-02 NOTE — Telephone Encounter (Signed)
patient can not afford Lipitor 20MG  --Pharmacy wants to know if ok to Change to Pravastatin 20 & 40MG  which is on $4/list??

## 2012-06-02 NOTE — Telephone Encounter (Signed)
Please advise      KP 

## 2012-06-02 NOTE — Telephone Encounter (Signed)
Pravastatin 40 mg #30  1 po qd  0 refills

## 2012-07-09 ENCOUNTER — Encounter: Payer: Self-pay | Admitting: Lab

## 2012-07-12 ENCOUNTER — Ambulatory Visit: Payer: BC Managed Care – PPO | Admitting: Family Medicine

## 2012-07-16 ENCOUNTER — Other Ambulatory Visit: Payer: Self-pay | Admitting: Family Medicine

## 2012-08-17 ENCOUNTER — Other Ambulatory Visit: Payer: Self-pay | Admitting: Family Medicine

## 2012-08-19 ENCOUNTER — Other Ambulatory Visit: Payer: Self-pay | Admitting: Family Medicine

## 2012-09-14 ENCOUNTER — Other Ambulatory Visit: Payer: Self-pay | Admitting: Family Medicine

## 2012-09-14 NOTE — Telephone Encounter (Signed)
Pending apt 4/11   KP

## 2012-09-27 ENCOUNTER — Telehealth: Payer: Self-pay | Admitting: Family Medicine

## 2012-09-27 MED ORDER — PRAVASTATIN SODIUM 40 MG PO TABS: ORAL_TABLET | ORAL | Status: DC

## 2012-09-27 MED ORDER — METFORMIN HCL 1000 MG PO TABS: ORAL_TABLET | ORAL | Status: DC

## 2012-09-27 NOTE — Telephone Encounter (Signed)
Refills x 2 1-metformin 1000mg  tab #60 take one tablet by mouth twice daily with a meal last fill 2.5.14   2-pravachol 40mg  tab #30 take one tablet by mouth every day last fill 2.6.14

## 2012-10-21 ENCOUNTER — Encounter: Payer: Self-pay | Admitting: Lab

## 2012-10-22 ENCOUNTER — Other Ambulatory Visit (HOSPITAL_COMMUNITY)
Admission: RE | Admit: 2012-10-22 | Discharge: 2012-10-22 | Disposition: A | Discharge status: Home / Self Care | Payer: BC Managed Care – PPO | Source: Ambulatory Visit | Attending: Family Medicine | Admitting: Family Medicine

## 2012-10-22 ENCOUNTER — Ambulatory Visit (INDEPENDENT_AMBULATORY_CARE_PROVIDER_SITE_OTHER): Payer: BC Managed Care – PPO | Admitting: Family Medicine

## 2012-10-22 ENCOUNTER — Encounter: Payer: Self-pay | Admitting: Family Medicine

## 2012-10-22 VITALS — BP 110/74 | HR 90 | Temp 99.3°F | Ht 64.0 in | Wt 219.8 lb

## 2012-10-22 DIAGNOSIS — Z1151 Encounter for screening for human papillomavirus (HPV): Secondary | ICD-10-CM | POA: Insufficient documentation

## 2012-10-22 DIAGNOSIS — Z124 Encounter for screening for malignant neoplasm of cervix: Secondary | ICD-10-CM

## 2012-10-22 DIAGNOSIS — F418 Other specified anxiety disorders: Secondary | ICD-10-CM

## 2012-10-22 DIAGNOSIS — E785 Hyperlipidemia, unspecified: Secondary | ICD-10-CM

## 2012-10-22 DIAGNOSIS — E119 Type 2 diabetes mellitus without complications: Secondary | ICD-10-CM

## 2012-10-22 DIAGNOSIS — I10 Essential (primary) hypertension: Secondary | ICD-10-CM

## 2012-10-22 DIAGNOSIS — Z01419 Encounter for gynecological examination (general) (routine) without abnormal findings: Secondary | ICD-10-CM | POA: Insufficient documentation

## 2012-10-22 DIAGNOSIS — M25569 Pain in unspecified knee: Secondary | ICD-10-CM

## 2012-10-22 DIAGNOSIS — E1110 Type 2 diabetes mellitus with ketoacidosis without coma: Secondary | ICD-10-CM

## 2012-10-22 DIAGNOSIS — M25561 Pain in right knee: Secondary | ICD-10-CM | POA: Insufficient documentation

## 2012-10-22 DIAGNOSIS — F341 Dysthymic disorder: Secondary | ICD-10-CM

## 2012-10-22 LAB — POCT URINALYSIS DIPSTICK
Bilirubin, UA: NEGATIVE
Ketones, UA: NEGATIVE
Leukocytes, UA: NEGATIVE
Nitrite, UA: NEGATIVE

## 2012-10-22 LAB — HEPATIC FUNCTION PANEL
ALT: 30 U/L (ref 0–35)
Albumin: 4.1 g/dL (ref 3.5–5.2)
Total Protein: 7.6 g/dL (ref 6.0–8.3)

## 2012-10-22 LAB — BASIC METABOLIC PANEL
BUN: 7 mg/dL (ref 6–23)
CO2: 25 mEq/L (ref 19–32)
Calcium: 8.9 mg/dL (ref 8.4–10.5)
Glucose, Bld: 122 mg/dL — ABNORMAL HIGH (ref 70–99)
Sodium: 132 mEq/L — ABNORMAL LOW (ref 135–145)

## 2012-10-22 LAB — LIPID PANEL
HDL: 37 mg/dL — ABNORMAL LOW (ref >39.00)
Triglycerides: 55 mg/dL (ref 0.0–149.0)

## 2012-10-22 LAB — CBC WITH DIFFERENTIAL/PLATELET
Basophils Absolute: 0 10*3/uL (ref 0.0–0.1)
Eosinophils Absolute: 0.1 10*3/uL (ref 0.0–0.7)
Hemoglobin: 12.9 g/dL (ref 12.0–15.0)
Lymphocytes Relative: 51.8 % — ABNORMAL HIGH (ref 12.0–46.0)
Lymphs Abs: 2.1 10*3/uL (ref 0.7–4.0)
MCHC: 33.6 g/dL (ref 30.0–36.0)
Neutro Abs: 1.4 10*3/uL (ref 1.4–7.7)
RDW: 13.7 % (ref 11.5–14.6)

## 2012-10-22 LAB — TSH: TSH: 1.39 u[IU]/mL (ref 0.35–5.50)

## 2012-10-22 LAB — MICROALBUMIN / CREATININE URINE RATIO
Microalb Creat Ratio: 0.4 mg/g (ref 0.0–30.0)
Microalb, Ur: 1.4 mg/dL (ref 0.0–1.9)

## 2012-10-22 MED ORDER — LISINOPRIL-HYDROCHLOROTHIAZIDE 10-12.5 MG PO TABS: ORAL_TABLET | ORAL | Status: DC

## 2012-10-22 MED ORDER — PRAVASTATIN SODIUM 40 MG PO TABS: ORAL_TABLET | ORAL | Status: DC

## 2012-10-22 MED ORDER — METFORMIN HCL 1000 MG PO TABS: ORAL_TABLET | ORAL | Status: DC

## 2012-10-22 MED ORDER — CLONAZEPAM 1 MG PO TABS: ORAL_TABLET | ORAL | Status: DC

## 2012-10-22 MED ORDER — CITALOPRAM HYDROBROMIDE 20 MG PO TABS: 10.0000 mg | ORAL_TABLET | Freq: Every day | ORAL | Status: DC

## 2012-10-22 NOTE — Assessment & Plan Note (Signed)
con't meds  Check labs 

## 2012-10-22 NOTE — Patient Instructions (Addendum)
Preventive Care for Adults, Female A healthy lifestyle and preventive care can promote health and wellness. Preventive health guidelines for women include the following key practices.  A routine yearly physical is a good way to check with your caregiver about your health and preventive screening. It is a chance to share any concerns and updates on your health, and to receive a thorough exam.  Visit your dentist for a routine exam and preventive care every 6 months. Brush your teeth twice a day and floss once a day. Good oral hygiene prevents tooth decay and gum disease.  The frequency of eye exams is based on your age, health, family medical history, use of contact lenses, and other factors. Follow your caregiver's recommendations for frequency of eye exams.  Eat a healthy diet. Foods like vegetables, fruits, whole grains, low-fat dairy products, and lean protein foods contain the nutrients you need without too many calories. Decrease your intake of foods high in solid fats, added sugars, and salt. Eat the right amount of calories for you.Get information about a proper diet from your caregiver, if necessary.  Regular physical exercise is one of the most important things you can do for your health. Most adults should get at least 150 minutes of moderate-intensity exercise (any activity that increases your heart rate and causes you to sweat) each week. In addition, most adults need muscle-strengthening exercises on 2 or more days a week.  Maintain a healthy weight. The body mass index (BMI) is a screening tool to identify possible weight problems. It provides an estimate of body fat based on height and weight. Your caregiver can help determine your BMI, and can help you achieve or maintain a healthy weight.For adults 20 years and older:  A BMI below 18.5 is considered underweight.  A BMI of 18.5 to 24.9 is normal.  A BMI of 25 to 29.9 is considered overweight.  A BMI of 30 and above is  considered obese.  Maintain normal blood lipids and cholesterol levels by exercising and minimizing your intake of saturated fat. Eat a balanced diet with plenty of fruit and vegetables. Blood tests for lipids and cholesterol should begin at age 20 and be repeated every 5 years. If your lipid or cholesterol levels are high, you are over 50, or you are at high risk for heart disease, you may need your cholesterol levels checked more frequently.Ongoing high lipid and cholesterol levels should be treated with medicines if diet and exercise are not effective.  If you smoke, find out from your caregiver how to quit. If you do not use tobacco, do not start.  If you are pregnant, do not drink alcohol. If you are breastfeeding, be very cautious about drinking alcohol. If you are not pregnant and choose to drink alcohol, do not exceed 1 drink per day. One drink is considered to be 12 ounces (355 mL) of beer, 5 ounces (148 mL) of wine, or 1.5 ounces (44 mL) of liquor.  Avoid use of street drugs. Do not share needles with anyone. Ask for help if you need support or instructions about stopping the use of drugs.  High blood pressure causes heart disease and increases the risk of stroke. Your blood pressure should be checked at least every 1 to 2 years. Ongoing high blood pressure should be treated with medicines if weight loss and exercise are not effective.  If you are 55 to 40 years old, ask your caregiver if you should take aspirin to prevent strokes.  Diabetes   screening involves taking a blood sample to check your fasting blood sugar level. This should be done once every 3 years, after age 45, if you are within normal weight and without risk factors for diabetes. Testing should be considered at a [place] age or be carried out more frequently if you are overweight and have at least 1 risk factor for diabetes.  Breast cancer screening is essential preventive care for women. You should practice "breast  self-awareness." This means understanding the normal appearance and feel of your breasts and may include breast self-examination. Any changes detected, no matter how small, should be reported to a caregiver. Women in their 20s and 30s should have a clinical breast exam (CBE) by a caregiver as part of a regular health exam every 1 to 3 years. After age 40, women should have a CBE every year. Starting at age 40, women should consider having a mammography (breast X-ray test) every year. Women who have a family history of breast cancer should talk to their caregiver about genetic screening. Women at a high risk of breast cancer should talk to their caregivers about having magnetic resonance imaging (MRI) and a mammography every year.  The Pap test is a screening test for cervical cancer. A Pap test can show cell changes on the cervix that might become cervical cancer if left untreated. A Pap test is a procedure in which cells are obtained and examined from the lower end of the uterus (cervix).  Women should have a Pap test starting at age 21.  Between ages 21 and 29, Pap tests should be repeated every 2 years.  Beginning at age 30, you should have a Pap test every 3 years as long as the past 3 Pap tests have been normal.  Some women have medical problems that increase the chance of getting cervical cancer. Talk to your caregiver about these problems. It is especially important to talk to your caregiver if a new problem develops soon after your last Pap test. In these cases, your caregiver may recommend more frequent screening and Pap tests.  The above recommendations are the same for women who have or have not gotten the vaccine for human papillomavirus (HPV).  If you had a hysterectomy for a problem that was not cancer or a condition that could lead to cancer, then you no longer need Pap tests. Even if you no longer need a Pap test, a regular exam is a good idea to make sure no other problems are  starting.  If you are between ages 65 and 70, and you have had normal Pap tests going back 10 years, you no longer need Pap tests. Even if you no longer need a Pap test, a regular exam is a good idea to make sure no other problems are starting.  If you have had past treatment for cervical cancer or a condition that could lead to cancer, you need Pap tests and screening for cancer for at least 20 years after your treatment.  If Pap tests have been discontinued, risk factors (such as a new sexual partner) need to be reassessed to determine if screening should be resumed.  The HPV test is an additional test that may be used for cervical cancer screening. The HPV test looks for the virus that can cause the cell changes on the cervix. The cells collected during the Pap test can be tested for HPV. The HPV test could be used to screen women aged 30 years and older, and should   be used in women of any age who have unclear Pap test results. After the age of 30, women should have HPV testing at the same frequency as a Pap test.  Colorectal cancer can be detected and often prevented. Most routine colorectal cancer screening begins at the age of 50 and continues through age 75. However, your caregiver may recommend screening at an earlier age if you have risk factors for colon cancer. On a yearly basis, your caregiver may provide home test kits to check for hidden blood in the stool. Use of a small camera at the end of a tube, to directly examine the colon (sigmoidoscopy or colonoscopy), can detect the earliest forms of colorectal cancer. Talk to your caregiver about this at age 50, when routine screening begins. Direct examination of the colon should be repeated every 5 to 10 years through age 75, unless early forms of pre-cancerous polyps or small growths are found.  Hepatitis C blood testing is recommended for all people born from 1945 through 1965 and any individual with known risks for hepatitis C.  Practice  safe sex. Use condoms and avoid high-risk sexual practices to reduce the spread of sexually transmitted infections (STIs). STIs include gonorrhea, chlamydia, syphilis, trichomonas, herpes, HPV, and human immunodeficiency virus (HIV). Herpes, HIV, and HPV are viral illnesses that have no cure. They can result in disability, cancer, and death. Sexually active women aged 25 and [place] should be checked for chlamydia. Older women with new or multiple partners should also be tested for chlamydia. Testing for other STIs is recommended if you are sexually active and at increased risk.  Osteoporosis is a disease in which the bones lose minerals and strength with aging. This can result in serious bone fractures. The risk of osteoporosis can be identified using a bone density scan. Women ages 65 and over and women at risk for fractures or osteoporosis should discuss screening with their caregivers. Ask your caregiver whether you should take a calcium supplement or vitamin D to reduce the rate of osteoporosis.  Menopause can be associated with physical symptoms and risks. Hormone replacement therapy is available to decrease symptoms and risks. You should talk to your caregiver about whether hormone replacement therapy is right for you.  Use sunscreen with sun protection factor (SPF) of 30 or more. Apply sunscreen liberally and repeatedly throughout the day. You should seek shade when your shadow is shorter than you. Protect yourself by wearing long sleeves, pants, a wide-brimmed hat, and sunglasses year round, whenever you are outdoors.  Once a month, do a whole body skin exam, using a mirror to look at the skin on your back. Notify your caregiver of new moles, moles that have irregular borders, moles that are larger than a pencil eraser, or moles that have changed in shape or color.  Stay current with required immunizations.  Influenza. You need a dose every fall (or winter). The composition of the flu vaccine  changes each year, so being vaccinated once is not enough.  Pneumococcal polysaccharide. You need 1 to 2 doses if you smoke cigarettes or if you have certain chronic medical conditions. You need 1 dose at age 65 (or older) if you have never been vaccinated.  Tetanus, diphtheria, pertussis (Tdap, Td). Get 1 dose of Tdap vaccine if you are [place] than age 65, are over 65 and have contact with an infant, are a healthcare worker, are pregnant, or simply want to be protected from whooping cough. After that, you need a Td   booster dose every 10 years. Consult your caregiver if you have not had at least 3 tetanus and diphtheria-containing shots sometime in your life or have a deep or dirty wound.  HPV. You need this vaccine if you are a woman age 26 or [place]. The vaccine is given in 3 doses over 6 months.  Measles, mumps, rubella (MMR). You need at least 1 dose of MMR if you were born in 1957 or later. You may also need a second dose.  Meningococcal. If you are age 19 to 21 and a first-year college student living in a residence hall, or have one of several medical conditions, you need to get vaccinated against meningococcal disease. You may also need additional booster doses.  Zoster (shingles). If you are age 60 or older, you should get this vaccine.  Varicella (chickenpox). If you have never had chickenpox or you were vaccinated but received only 1 dose, talk to your caregiver to find out if you need this vaccine.  Hepatitis A. You need this vaccine if you have a specific risk factor for hepatitis A virus infection or you simply wish to be protected from this disease. The vaccine is usually given as 2 doses, 6 to 18 months apart.  Hepatitis B. You need this vaccine if you have a specific risk factor for hepatitis B virus infection or you simply wish to be protected from this disease. The vaccine is given in 3 doses, usually over 6 months. Preventive Services / Frequency Ages 19 to 39  Blood  pressure check.** / Every 1 to 2 years.  Lipid and cholesterol check.** / Every 5 years beginning at age 20.  Clinical breast exam.** / Every 3 years for women in their 20s and 30s.  Pap test.** / Every 2 years from ages 21 through 29. Every 3 years starting at age 30 through age 65 or 70 with a history of 3 consecutive normal Pap tests.  HPV screening.** / Every 3 years from ages 30 through ages 65 to 70 with a history of 3 consecutive normal Pap tests.  Hepatitis C blood test.** / For any individual with known risks for hepatitis C.  Skin self-exam. / Monthly.  Influenza immunization.** / Every year.  Pneumococcal polysaccharide immunization.** / 1 to 2 doses if you smoke cigarettes or if you have certain chronic medical conditions.  Tetanus, diphtheria, pertussis (Tdap, Td) immunization. / A one-time dose of Tdap vaccine. After that, you need a Td booster dose every 10 years.  HPV immunization. / 3 doses over 6 months, if you are 26 and [place].  Measles, mumps, rubella (MMR) immunization. / You need at least 1 dose of MMR if you were born in 1957 or later. You may also need a second dose.  Meningococcal immunization. / 1 dose if you are age 19 to 21 and a first-year college student living in a residence hall, or have one of several medical conditions, you need to get vaccinated against meningococcal disease. You may also need additional booster doses.  Varicella immunization.** / Consult your caregiver.  Hepatitis A immunization.** / Consult your caregiver. 2 doses, 6 to 18 months apart.  Hepatitis B immunization.** / Consult your caregiver. 3 doses usually over 6 months. Ages 40 to 64  Blood pressure check.** / Every 1 to 2 years.  Lipid and cholesterol check.** / Every 5 years beginning at age 20.  Clinical breast exam.** / Every year after age 40.  Mammogram.** / Every year beginning at age 40   and continuing for as long as you are in good health. Consult with your  caregiver.  Pap test.** / Every 3 years starting at age 30 through age 65 or 70 with a history of 3 consecutive normal Pap tests.  HPV screening.** / Every 3 years from ages 30 through ages 65 to 70 with a history of 3 consecutive normal Pap tests.  Fecal occult blood test (FOBT) of stool. / Every year beginning at age 50 and continuing until age 75. You may not need to do this test if you get a colonoscopy every 10 years.  Flexible sigmoidoscopy or colonoscopy.** / Every 5 years for a flexible sigmoidoscopy or every 10 years for a colonoscopy beginning at age 50 and continuing until age 75.  Hepatitis C blood test.** / For all people born from 1945 through 1965 and any individual with known risks for hepatitis C.  Skin self-exam. / Monthly.  Influenza immunization.** / Every year.  Pneumococcal polysaccharide immunization.** / 1 to 2 doses if you smoke cigarettes or if you have certain chronic medical conditions.  Tetanus, diphtheria, pertussis (Tdap, Td) immunization.** / A one-time dose of Tdap vaccine. After that, you need a Td booster dose every 10 years.  Measles, mumps, rubella (MMR) immunization. / You need at least 1 dose of MMR if you were born in 1957 or later. You may also need a second dose.  Varicella immunization.** / Consult your caregiver.  Meningococcal immunization.** / Consult your caregiver.  Hepatitis A immunization.** / Consult your caregiver. 2 doses, 6 to 18 months apart.  Hepatitis B immunization.** / Consult your caregiver. 3 doses, usually over 6 months. Ages 65 and over  Blood pressure check.** / Every 1 to 2 years.  Lipid and cholesterol check.** / Every 5 years beginning at age 20.  Clinical breast exam.** / Every year after age 40.  Mammogram.** / Every year beginning at age 40 and continuing for as long as you are in good health. Consult with your caregiver.  Pap test.** / Every 3 years starting at age 30 through age 65 or 70 with a 3  consecutive normal Pap tests. Testing can be stopped between 65 and 70 with 3 consecutive normal Pap tests and no abnormal Pap or HPV tests in the past 10 years.  HPV screening.** / Every 3 years from ages 30 through ages 65 or 70 with a history of 3 consecutive normal Pap tests. Testing can be stopped between 65 and 70 with 3 consecutive normal Pap tests and no abnormal Pap or HPV tests in the past 10 years.  Fecal occult blood test (FOBT) of stool. / Every year beginning at age 50 and continuing until age 75. You may not need to do this test if you get a colonoscopy every 10 years.  Flexible sigmoidoscopy or colonoscopy.** / Every 5 years for a flexible sigmoidoscopy or every 10 years for a colonoscopy beginning at age 50 and continuing until age 75.  Hepatitis C blood test.** / For all people born from 1945 through 1965 and any individual with known risks for hepatitis C.  Osteoporosis screening.** / A one-time screening for women ages 65 and over and women at risk for fractures or osteoporosis.  Skin self-exam. / Monthly.  Influenza immunization.** / Every year.  Pneumococcal polysaccharide immunization.** / 1 dose at age 65 (or older) if you have never been vaccinated.  Tetanus, diphtheria, pertussis (Tdap, Td) immunization. / A one-time dose of Tdap vaccine if you are over   65 and have contact with an infant, are a healthcare worker, or simply want to be protected from whooping cough. After that, you need a Td booster dose every 10 years.  Varicella immunization.** / Consult your caregiver.  Meningococcal immunization.** / Consult your caregiver.  Hepatitis A immunization.** / Consult your caregiver. 2 doses, 6 to 18 months apart.  Hepatitis B immunization.** / Check with your caregiver. 3 doses, usually over 6 months. ** Family history and personal history of risk and conditions may change your caregiver's recommendations. Document Released: 08/26/2001 Document Revised: 09/22/2011  Document Reviewed: 11/25/2010 [employer] Patient Information 2013 [employer].  

## 2012-10-22 NOTE — Assessment & Plan Note (Signed)
Stable con't meds 

## 2012-10-22 NOTE — Assessment & Plan Note (Signed)
Check labs con't meds 

## 2012-10-22 NOTE — Progress Notes (Signed)
Subjective:     Jasmine Hinton is a 42 y.o. female and is here for a comprehensive physical exam. The patient reports no problems.  History   Social History  . Marital Status: Legally Separated    Spouse Name: N/A    Number of Children: N/A  . Years of Education: N/A   Occupational History  . unemployed    Social History Main Topics  . Smoking status: Never Smoker   . Smokeless tobacco: Never Used  . Alcohol Use: Yes     Comment: rare  . Drug Use: No  . Sexually Active: Yes -- Female partner(s)    Birth Control/ Protection: IUD     Comment: one partner currently   Other Topics Concern  . Not on file   Social History Narrative  . No narrative on file   Health Maintenance  Topic Date Due  . Influenza Vaccine  03/14/2013  . Pap Smear  10/23/2015  . Tetanus/tdap  09/08/2018    The following portions of the patient's history were reviewed and updated as appropriate:  She  has a past medical history of Depression; Hypertension; Diabetes mellitus; and Anxiety. She  does not have any pertinent problems on file. She  has past surgical history that includes Tonsillectomy. Her family history includes Colon cancer (age of onset: 40) in her maternal grandfather; Depression in an unspecified family member; Diabetes in an unspecified family member; and Hypertension in an unspecified family member. She  reports that she has never smoked. She has never used smokeless tobacco. She reports that  drinks alcohol. She reports that she does not use illicit drugs. She has a current medication list which includes the following prescription(s): citalopram, clonazepam, fluticasone, freestyle, levonorgestrel, lisinopril-hydrochlorothiazide, metformin, one touch ultra test, onetouch delica lancets, and pravastatin. Current Outpatient Prescriptions on File Prior to Visit  Medication Sig Dispense Refill  . fluticasone (FLONASE) 50 MCG/ACT nasal spray Place 2 sprays into the nose daily.        .  Lancets (FREESTYLE) lancets 1 each by Other route as needed. Use as instructed       . levonorgestrel (MIRENA) 20 MCG/24HR IUD 1 each by Intrauterine route once.        . ONE TOUCH ULTRA TEST test strip TEST BLOOD SUGAR 2 TIMES A DAY.  100 each  3  . ONETOUCH DELICA LANCETS MISC 1 tablet by Does not apply route.         No current facility-administered medications on file prior to visit.   She has No Known Allergies..  Review of Systems Review of Systems  Constitutional: Negative for activity change, appetite change and fatigue.  HENT: Negative for hearing loss, congestion, tinnitus and ear discharge.  dentist q38m Eyes: Negative for visual disturbance (see optho q1y -- vision corrected to 20/20 with glasses).  Respiratory: Negative for cough, chest tightness and shortness of breath.   Cardiovascular: Negative for chest pain, palpitations and leg swelling.  Gastrointestinal: Negative for abdominal pain, diarrhea, constipation and abdominal distention.  Genitourinary: Negative for urgency, frequency, decreased urine volume and difficulty urinating.  Musculoskeletal: Negative for back pain, arthralgias and gait problem.  Skin: Negative for color change, pallor and rash.  Neurological: Negative for dizziness, light-headedness, numbness and headaches.  Hematological: Negative for adenopathy. Does not bruise/bleed easily.  Psychiatric/Behavioral: Negative for suicidal ideas, confusion, sleep disturbance, self-injury, dysphoric mood, decreased concentration and agitation.       Objective:    BP 110/74  Pulse 90  Temp(Src)  99.3 F (37.4 C) (Oral)  Ht 5\' 4"  (1.626 m)  Wt 219 lb 12.8 oz (99.701 kg)  BMI 37.71 kg/m2  SpO2 97%  LMP 10/08/2012 General appearance: alert, cooperative, appears stated age and no distress Head: Normocephalic, without obvious abnormality, atraumatic Eyes: conjunctivae/corneas clear. PERRL, EOM's intact. Fundi benign. Ears: normal TM's and external ear canals  both ears Nose: Nares normal. Septum midline. Mucosa normal. No drainage or sinus tenderness. Throat: lips, mucosa, and tongue normal; teeth and gums normal Neck: no adenopathy, no carotid bruit, no JVD, supple, symmetrical, trachea midline and thyroid not enlarged, symmetric, no tenderness/mass/nodules Back: symmetric, no curvature. ROM normal. No CVA tenderness. Lungs: clear to auscultation bilaterally Breasts: normal appearance, no masses or tenderness Heart: regular rate and rhythm, S1, S2 normal, no murmur, click, rub or gallop Abdomen: soft, non-tender; bowel sounds normal; no masses,  no organomegaly Pelvic: cervix normal in appearance, external genitalia normal, no adnexal masses or tenderness, no cervical motion tenderness, rectovaginal septum normal, uterus normal size, shape, and consistency and vagina normal without discharge Extremities: extremities normal, atraumatic, no cyanosis or edema Pulses: 2+ and symmetric Skin: Skin color, texture, turgor normal. No rashes or lesions Lymph nodes: Cervical, supraclavicular, and axillary nodes normal. Neurologic: Alert and oriented X 3, normal strength and tone. Normal symmetric reflexes. Normal coordination and gait Psych-- no depression, no anxiety      Assessment:    Healthy female exam.      Plan:    ghm utd Check labs See After Visit Summary for Counseling Recommendations

## 2012-11-04 ENCOUNTER — Other Ambulatory Visit: Payer: Self-pay

## 2012-11-04 DIAGNOSIS — E785 Hyperlipidemia, unspecified: Secondary | ICD-10-CM

## 2012-11-04 MED ORDER — POTASSIUM CHLORIDE CRYS ER 20 MEQ PO TBCR: EXTENDED_RELEASE_TABLET | ORAL | Status: DC

## 2012-11-04 MED ORDER — ATORVASTATIN CALCIUM 20 MG PO TABS: 20.0000 mg | ORAL_TABLET | Freq: Every day | ORAL | Status: DC

## 2012-11-13 ENCOUNTER — Encounter: Payer: Self-pay | Admitting: Family Medicine

## 2013-05-19 ENCOUNTER — Other Ambulatory Visit: Payer: Self-pay

## 2013-11-12 ENCOUNTER — Other Ambulatory Visit: Payer: Self-pay | Admitting: Family Medicine

## 2013-12-21 ENCOUNTER — Other Ambulatory Visit: Payer: Self-pay | Admitting: Family Medicine

## 2013-12-31 ENCOUNTER — Other Ambulatory Visit: Payer: Self-pay | Admitting: Family Medicine

## 2014-01-02 ENCOUNTER — Other Ambulatory Visit: Payer: Self-pay | Admitting: Family Medicine

## 2014-01-09 ENCOUNTER — Other Ambulatory Visit: Payer: Self-pay | Admitting: Family Medicine

## 2014-01-19 ENCOUNTER — Other Ambulatory Visit: Payer: Self-pay | Admitting: Family Medicine

## 2014-01-19 DIAGNOSIS — I10 Essential (primary) hypertension: Secondary | ICD-10-CM

## 2014-01-19 NOTE — Telephone Encounter (Signed)
Refill for lisinopril sent to [facility] on [address]

## 2014-01-20 ENCOUNTER — Other Ambulatory Visit: Payer: Self-pay | Admitting: Family Medicine

## 2014-03-10 ENCOUNTER — Encounter: Payer: BC Managed Care – PPO | Admitting: Family Medicine

## 2014-05-12 ENCOUNTER — Encounter: Payer: BC Managed Care – PPO | Admitting: Family Medicine

## 2014-05-29 ENCOUNTER — Telehealth: Payer: Self-pay

## 2014-05-29 DIAGNOSIS — E119 Type 2 diabetes mellitus without complications: Secondary | ICD-10-CM

## 2014-05-29 DIAGNOSIS — E1110 Type 2 diabetes mellitus with ketoacidosis without coma: Secondary | ICD-10-CM

## 2014-05-29 DIAGNOSIS — E785 Hyperlipidemia, unspecified: Secondary | ICD-10-CM

## 2014-05-29 MED ORDER — METFORMIN HCL 1000 MG PO TABS: ORAL_TABLET | ORAL | Status: DC

## 2014-05-29 NOTE — Telephone Encounter (Signed)
Patient scheduled lab appointment for this Wednesday. Please place orders.

## 2014-05-29 NOTE — Telephone Encounter (Signed)
-----   Message from Irven [place] sent at 05/29/2014  1:03 PM EST ----- Patient states that she is returning your phone call. Best # [phone]

## 2014-05-29 NOTE — Telephone Encounter (Signed)
She needs to come in and have labs done, before her medications are filled, please offer her a lab only apt.Marland Kitchen       KP

## 2014-05-31 ENCOUNTER — Other Ambulatory Visit (INDEPENDENT_AMBULATORY_CARE_PROVIDER_SITE_OTHER): Payer: BC Managed Care – PPO

## 2014-05-31 DIAGNOSIS — E119 Type 2 diabetes mellitus without complications: Secondary | ICD-10-CM

## 2014-05-31 DIAGNOSIS — E785 Hyperlipidemia, unspecified: Secondary | ICD-10-CM

## 2014-05-31 LAB — BASIC METABOLIC PANEL
BUN: 7 mg/dL (ref 6–23)
CO2: 26 mEq/L (ref 19–32)
Calcium: 9.2 mg/dL (ref 8.4–10.5)
Chloride: 107 mEq/L (ref 96–112)
Creatinine, Ser: 0.5 mg/dL (ref 0.4–1.2)
GFR: 160.06 mL/min (ref >60.00)
Glucose, Bld: 188 mg/dL — ABNORMAL HIGH (ref 70–99)
Potassium: 3.4 mEq/L — ABNORMAL LOW (ref 3.5–5.1)
Sodium: 143 mEq/L (ref 135–145)

## 2014-05-31 LAB — HEPATIC FUNCTION PANEL
ALT: 42 U/L — ABNORMAL HIGH (ref 0–35)
AST: 24 U/L (ref 0–37)
Albumin: 4.1 g/dL (ref 3.5–5.2)
Alkaline Phosphatase: 65 U/L (ref 39–117)
BILIRUBIN DIRECT: 0 mg/dL (ref 0.0–0.3)
BILIRUBIN TOTAL: 0.8 mg/dL (ref 0.2–1.2)
TOTAL PROTEIN: 7.6 g/dL (ref 6.0–8.3)

## 2014-05-31 LAB — LIPID PANEL
CHOL/HDL RATIO: 4
Cholesterol: 204 mg/dL — ABNORMAL HIGH (ref 0–200)
HDL: 45.7 mg/dL (ref >39.00)
LDL CALC: 143 mg/dL — AB (ref 0–99)
NONHDL: 158.3
Triglycerides: 75 mg/dL (ref 0.0–149.0)
VLDL: 15 mg/dL (ref 0.0–40.0)

## 2014-05-31 LAB — HEMOGLOBIN A1C: Hgb A1c MFr Bld: 8.7 % — ABNORMAL HIGH (ref 4.6–6.5)

## 2014-06-06 ENCOUNTER — Telehealth: Payer: Self-pay | Admitting: Family Medicine

## 2014-06-06 MED ORDER — GLUCOSE BLOOD VI STRP: ORAL_STRIP | Status: DC

## 2014-06-06 NOTE — Telephone Encounter (Signed)
Rx faxed.    KP 

## 2014-06-06 NOTE — Telephone Encounter (Signed)
Caller name:Breeze, Namita Relation to RC:VKFM Call back number:[phone] Pharmacy:[facility]  Reason for call: pt states she is needing rx for ONE TOUCH ULTRA TEST test strip,

## 2014-07-05 ENCOUNTER — Other Ambulatory Visit: Payer: Self-pay | Admitting: Family Medicine

## 2014-07-06 NOTE — Telephone Encounter (Signed)
Has a CPE appointment scheduled 07/21/14.    Med filled.

## 2014-07-21 ENCOUNTER — Ambulatory Visit (INDEPENDENT_AMBULATORY_CARE_PROVIDER_SITE_OTHER): Payer: BLUE CROSS/BLUE SHIELD | Admitting: Family Medicine

## 2014-07-21 ENCOUNTER — Other Ambulatory Visit (HOSPITAL_COMMUNITY)
Admission: RE | Admit: 2014-07-21 | Discharge: 2014-07-21 | Disposition: A | Discharge status: Home / Self Care | Payer: BLUE CROSS/BLUE SHIELD | Source: Ambulatory Visit | Attending: Family Medicine | Admitting: Family Medicine

## 2014-07-21 ENCOUNTER — Encounter: Payer: Self-pay | Admitting: Family Medicine

## 2014-07-21 VITALS — BP 134/84 | HR 116 | Temp 99.2°F | Resp 18 | Ht 64.5 in | Wt 224.2 lb

## 2014-07-21 DIAGNOSIS — Z01419 Encounter for gynecological examination (general) (routine) without abnormal findings: Secondary | ICD-10-CM | POA: Insufficient documentation

## 2014-07-21 DIAGNOSIS — IMO0002 Reserved for concepts with insufficient information to code with codable children: Secondary | ICD-10-CM

## 2014-07-21 DIAGNOSIS — Z1151 Encounter for screening for human papillomavirus (HPV): Secondary | ICD-10-CM | POA: Insufficient documentation

## 2014-07-21 DIAGNOSIS — E1165 Type 2 diabetes mellitus with hyperglycemia: Secondary | ICD-10-CM

## 2014-07-21 DIAGNOSIS — R3915 Urgency of urination: Secondary | ICD-10-CM

## 2014-07-21 DIAGNOSIS — Z0000 Encounter for general adult medical examination without abnormal findings: Secondary | ICD-10-CM

## 2014-07-21 DIAGNOSIS — Z1239 Encounter for other screening for malignant neoplasm of breast: Secondary | ICD-10-CM

## 2014-07-21 DIAGNOSIS — I10 Essential (primary) hypertension: Secondary | ICD-10-CM

## 2014-07-21 DIAGNOSIS — Z124 Encounter for screening for malignant neoplasm of cervix: Secondary | ICD-10-CM

## 2014-07-21 DIAGNOSIS — E785 Hyperlipidemia, unspecified: Secondary | ICD-10-CM

## 2014-07-21 DIAGNOSIS — M5440 Lumbago with sciatica, unspecified side: Secondary | ICD-10-CM

## 2014-07-21 DIAGNOSIS — Z23 Encounter for immunization: Secondary | ICD-10-CM

## 2014-07-21 LAB — POCT URINALYSIS DIPSTICK
Bilirubin, UA: NEGATIVE
Blood, UA: NEGATIVE
KETONES UA: NEGATIVE
Nitrite, UA: NEGATIVE
Spec Grav, UA: 1.025
Urobilinogen, UA: 4
pH, UA: 6

## 2014-07-21 LAB — GLUCOSE, POCT (MANUAL RESULT ENTRY): POC Glucose: 237 mg/dl — AB (ref 70–99)

## 2014-07-21 MED ORDER — METFORMIN HCL 1000 MG PO TABS: ORAL_TABLET | ORAL | Status: DC

## 2014-07-21 MED ORDER — NONFORMULARY OR COMPOUNDED ITEM: Status: DC

## 2014-07-21 MED ORDER — ATORVASTATIN CALCIUM 20 MG PO TABS: 20.0000 mg | ORAL_TABLET | Freq: Every day | ORAL | Status: DC

## 2014-07-21 MED ORDER — GLUCOSE BLOOD VI STRP: ORAL_STRIP | Status: DC

## 2014-07-21 NOTE — Patient Instructions (Signed)
Preventive Care for Adults A healthy lifestyle and preventive care can promote health and wellness. Preventive health guidelines for women include the following key practices.  A routine yearly physical is a good way to check with your health care provider about your health and preventive screening. It is a chance to share any concerns and updates on your health and to receive a thorough exam.  Visit your dentist for a routine exam and preventive care every 6 months. Brush your teeth twice a day and floss once a day. Good oral hygiene prevents tooth decay and gum disease.  The frequency of eye exams is based on your age, health, family medical history, use of contact lenses, and other factors. Follow your health care provider's recommendations for frequency of eye exams.  Eat a healthy diet. Foods like vegetables, fruits, whole grains, low-fat dairy products, and lean protein foods contain the nutrients you need without too many calories. Decrease your intake of foods high in solid fats, added sugars, and salt. Eat the right amount of calories for you.Get information about a proper diet from your health care provider, if necessary.  Regular physical exercise is one of the most important things you can do for your health. Most adults should get at least 150 minutes of moderate-intensity exercise (any activity that increases your heart rate and causes you to sweat) each week. In addition, most adults need muscle-strengthening exercises on 2 or more days a week.  Maintain a healthy weight. The body mass index (BMI) is a screening tool to identify possible weight problems. It provides an estimate of body fat based on height and weight. Your health care provider can find your BMI and can help you achieve or maintain a healthy weight.For adults 20 years and older:  A BMI below 18.5 is considered underweight.  A BMI of 18.5 to 24.9 is normal.  A BMI of 25 to 29.9 is considered overweight.  A BMI of  30 and above is considered obese.  Maintain normal blood lipids and cholesterol levels by exercising and minimizing your intake of saturated fat. Eat a balanced diet with plenty of fruit and vegetables. Blood tests for lipids and cholesterol should begin at age 76 and be repeated every 5 years. If your lipid or cholesterol levels are high, you are over 50, or you are at high risk for heart disease, you may need your cholesterol levels checked more frequently.Ongoing high lipid and cholesterol levels should be treated with medicines if diet and exercise are not working.  If you smoke, find out from your health care provider how to quit. If you do not use tobacco, do not start.  Lung cancer screening is recommended for adults aged 22-80 years who are at high risk for developing lung cancer because of a history of smoking. A yearly low-dose CT scan of the lungs is recommended for people who have at least a 30-pack-year history of smoking and are a current smoker or have quit within the past 15 years. A pack year of smoking is smoking an average of 1 pack of cigarettes a day for 1 year (for example: 1 pack a day for 30 years or 2 packs a day for 15 years). Yearly screening should continue until the smoker has stopped smoking for at least 15 years. Yearly screening should be stopped for people who develop a health problem that would prevent them from having lung cancer treatment.  If you are pregnant, do not drink alcohol. If you are breastfeeding,  be very cautious about drinking alcohol. If you are not pregnant and choose to drink alcohol, do not have more than 1 drink per day. One drink is considered to be 12 ounces (355 mL) of beer, 5 ounces (148 mL) of wine, or 1.5 ounces (44 mL) of liquor.  Avoid use of street drugs. Do not share needles with anyone. Ask for help if you need support or instructions about stopping the use of drugs.  High blood pressure causes heart disease and increases the risk of  stroke. Your blood pressure should be checked at least every 1 to 2 years. Ongoing high blood pressure should be treated with medicines if weight loss and exercise do not work.  If you are 3-86 years old, ask your health care provider if you should take aspirin to prevent strokes.  Diabetes screening involves taking a blood sample to check your fasting blood sugar level. This should be done once every 3 years, after age 67, if you are within normal weight and without risk factors for diabetes. Testing should be considered at a [place] age or be carried out more frequently if you are overweight and have at least 1 risk factor for diabetes.  Breast cancer screening is essential preventive care for women. You should practice "breast self-awareness." This means understanding the normal appearance and feel of your breasts and may include breast self-examination. Any changes detected, no matter how small, should be reported to a health care provider. Women in their 8s and 30s should have a clinical breast exam (CBE) by a health care provider as part of a regular health exam every 1 to 3 years. After age 70, women should have a CBE every year. Starting at age 25, women should consider having a mammogram (breast X-ray test) every year. Women who have a family history of breast cancer should talk to their health care provider about genetic screening. Women at a high risk of breast cancer should talk to their health care providers about having an MRI and a mammogram every year.  Breast cancer gene (BRCA)-related cancer risk assessment is recommended for women who have family members with BRCA-related cancers. BRCA-related cancers include breast, ovarian, tubal, and peritoneal cancers. Having family members with these cancers may be associated with an increased risk for harmful changes (mutations) in the breast cancer genes BRCA1 and BRCA2. Results of the assessment will determine the need for genetic counseling and  BRCA1 and BRCA2 testing.  Routine pelvic exams to screen for cancer are no longer recommended for nonpregnant women who are considered low risk for cancer of the pelvic organs (ovaries, uterus, and vagina) and who do not have symptoms. Ask your health care provider if a screening pelvic exam is right for you.  If you have had past treatment for cervical cancer or a condition that could lead to cancer, you need Pap tests and screening for cancer for at least 20 years after your treatment. If Pap tests have been discontinued, your risk factors (such as having a new sexual partner) need to be reassessed to determine if screening should be resumed. Some women have medical problems that increase the chance of getting cervical cancer. In these cases, your health care provider may recommend more frequent screening and Pap tests.  The HPV test is an additional test that may be used for cervical cancer screening. The HPV test looks for the virus that can cause the cell changes on the cervix. The cells collected during the Pap test can be  tested for HPV. The HPV test could be used to screen women aged 30 years and older, and should be used in women of any age who have unclear Pap test results. After the age of 30, women should have HPV testing at the same frequency as a Pap test.  Colorectal cancer can be detected and often prevented. Most routine colorectal cancer screening begins at the age of 50 years and continues through age 75 years. However, your health care provider may recommend screening at an earlier age if you have risk factors for colon cancer. On a yearly basis, your health care provider may provide home test kits to check for hidden blood in the stool. Use of a small camera at the end of a tube, to directly examine the colon (sigmoidoscopy or colonoscopy), can detect the earliest forms of colorectal cancer. Talk to your health care provider about this at age 50, when routine screening begins. Direct  exam of the colon should be repeated every 5-10 years through age 75 years, unless early forms of pre-cancerous polyps or small growths are found.  People who are at an increased risk for hepatitis B should be screened for this virus. You are considered at high risk for hepatitis B if:  You were born in a country where hepatitis B occurs often. Talk with your health care provider about which countries are considered high risk.  Your parents were born in a high-risk country and you have not received a shot to protect against hepatitis B (hepatitis B vaccine).  You have HIV or AIDS.  You use needles to inject street drugs.  You live with, or have sex with, someone who has hepatitis B.  You get hemodialysis treatment.  You take certain medicines for conditions like cancer, organ transplantation, and autoimmune conditions.  Hepatitis C blood testing is recommended for all people born from 1945 through 1965 and any individual with known risks for hepatitis C.  Practice safe sex. Use condoms and avoid high-risk sexual practices to reduce the spread of sexually transmitted infections (STIs). STIs include gonorrhea, chlamydia, syphilis, trichomonas, herpes, HPV, and human immunodeficiency virus (HIV). Herpes, HIV, and HPV are viral illnesses that have no cure. They can result in disability, cancer, and death.  You should be screened for sexually transmitted illnesses (STIs) including gonorrhea and chlamydia if:  You are sexually active and are [place] than 24 years.  You are older than 24 years and your health care provider tells you that you are at risk for this type of infection.  Your sexual activity has changed since you were last screened and you are at an increased risk for chlamydia or gonorrhea. Ask your health care provider if you are at risk.  If you are at risk of being infected with HIV, it is recommended that you take a prescription medicine daily to prevent HIV infection. This is  called preexposure prophylaxis (PrEP). You are considered at risk if:  You are a heterosexual woman, are sexually active, and are at increased risk for HIV infection.  You take drugs by injection.  You are sexually active with a partner who has HIV.  Talk with your health care provider about whether you are at high risk of being infected with HIV. If you choose to begin PrEP, you should first be tested for HIV. You should then be tested every 3 months for as long as you are taking PrEP.  Osteoporosis is a disease in which the bones lose minerals and strength   with aging. This can result in serious bone fractures or breaks. The risk of osteoporosis can be identified using a bone density scan. Women ages 65 years and over and women at risk for fractures or osteoporosis should discuss screening with their health care providers. Ask your health care provider whether you should take a calcium supplement or vitamin D to reduce the rate of osteoporosis.  Menopause can be associated with physical symptoms and risks. Hormone replacement therapy is available to decrease symptoms and risks. You should talk to your health care provider about whether hormone replacement therapy is right for you.  Use sunscreen. Apply sunscreen liberally and repeatedly throughout the day. You should seek shade when your shadow is shorter than you. Protect yourself by wearing long sleeves, pants, a wide-brimmed hat, and sunglasses year round, whenever you are outdoors.  Once a month, do a whole body skin exam, using a mirror to look at the skin on your back. Tell your health care provider of new moles, moles that have irregular borders, moles that are larger than a pencil eraser, or moles that have changed in shape or color.  Stay current with required vaccines (immunizations).  Influenza vaccine. All adults should be immunized every year.  Tetanus, diphtheria, and acellular pertussis (Td, Tdap) vaccine. Pregnant women should  receive 1 dose of Tdap vaccine during each pregnancy. The dose should be obtained regardless of the length of time since the last dose. Immunization is preferred during the 27th-36th week of gestation. An adult who has not previously received Tdap or who does not know her vaccine status should receive 1 dose of Tdap. This initial dose should be followed by tetanus and diphtheria toxoids (Td) booster doses every 10 years. Adults with an unknown or incomplete history of completing a 3-dose immunization series with Td-containing vaccines should begin or complete a primary immunization series including a Tdap dose. Adults should receive a Td booster every 10 years.  Varicella vaccine. An adult without evidence of immunity to varicella should receive 2 doses or a second dose if she has previously received 1 dose. Pregnant females who do not have evidence of immunity should receive the first dose after pregnancy. This first dose should be obtained before leaving the health care facility. The second dose should be obtained 4-8 weeks after the first dose.  Human papillomavirus (HPV) vaccine. Females aged 13-26 years who have not received the vaccine previously should obtain the 3-dose series. The vaccine is not recommended for use in pregnant females. However, pregnancy testing is not needed before receiving a dose. If a female is found to be pregnant after receiving a dose, no treatment is needed. In that case, the remaining doses should be delayed until after the pregnancy. Immunization is recommended for any person with an immunocompromised condition through the age of 26 years if she did not get any or all doses earlier. During the 3-dose series, the second dose should be obtained 4-8 weeks after the first dose. The third dose should be obtained 24 weeks after the first dose and 16 weeks after the second dose.  Zoster vaccine. One dose is recommended for adults aged 60 years or older unless certain conditions are  present.  Measles, mumps, and rubella (MMR) vaccine. Adults born before 1957 generally are considered immune to measles and mumps. Adults born in 1957 or later should have 1 or more doses of MMR vaccine unless there is a contraindication to the vaccine or there is laboratory evidence of immunity to   each of the three diseases. A routine second dose of MMR vaccine should be obtained at least 28 days after the first dose for students attending postsecondary schools, health care workers, or international travelers. People who received inactivated measles vaccine or an unknown type of measles vaccine during 1963-1967 should receive 2 doses of MMR vaccine. People who received inactivated mumps vaccine or an unknown type of mumps vaccine before 1979 and are at high risk for mumps infection should consider immunization with 2 doses of MMR vaccine. For females of childbearing age, rubella immunity should be determined. If there is no evidence of immunity, females who are not pregnant should be vaccinated. If there is no evidence of immunity, females who are pregnant should delay immunization until after pregnancy. Unvaccinated health care workers born before 1957 who lack laboratory evidence of measles, mumps, or rubella immunity or laboratory confirmation of disease should consider measles and mumps immunization with 2 doses of MMR vaccine or rubella immunization with 1 dose of MMR vaccine.  Pneumococcal 13-valent conjugate (PCV13) vaccine. When indicated, a person who is uncertain of her immunization history and has no record of immunization should receive the PCV13 vaccine. An adult aged 19 years or older who has certain medical conditions and has not been previously immunized should receive 1 dose of PCV13 vaccine. This PCV13 should be followed with a dose of pneumococcal polysaccharide (PPSV23) vaccine. The PPSV23 vaccine dose should be obtained at least 8 weeks after the dose of PCV13 vaccine. An adult aged 19  years or older who has certain medical conditions and previously received 1 or more doses of PPSV23 vaccine should receive 1 dose of PCV13. The PCV13 vaccine dose should be obtained 1 or more years after the last PPSV23 vaccine dose.  Pneumococcal polysaccharide (PPSV23) vaccine. When PCV13 is also indicated, PCV13 should be obtained first. All adults aged 65 years and older should be immunized. An adult [place] than age 65 years who has certain medical conditions should be immunized. Any person who resides in a nursing home or long-term care facility should be immunized. An adult smoker should be immunized. People with an immunocompromised condition and certain other conditions should receive both PCV13 and PPSV23 vaccines. People with human immunodeficiency virus (HIV) infection should be immunized as soon as possible after diagnosis. Immunization during chemotherapy or radiation therapy should be avoided. Routine use of PPSV23 vaccine is not recommended for American Indians, [place] Natives, or people [place] than 65 years unless there are medical conditions that require PPSV23 vaccine. When indicated, people who have unknown immunization and have no record of immunization should receive PPSV23 vaccine. One-time revaccination 5 years after the first dose of PPSV23 is recommended for people aged 19-64 years who have chronic kidney failure, nephrotic syndrome, asplenia, or immunocompromised conditions. People who received 1-2 doses of PPSV23 before age 65 years should receive another dose of PPSV23 vaccine at age 65 years or later if at least 5 years have passed since the previous dose. Doses of PPSV23 are not needed for people immunized with PPSV23 at or after age 65 years.  Meningococcal vaccine. Adults with asplenia or persistent complement component deficiencies should receive 2 doses of quadrivalent meningococcal conjugate (MenACWY-D) vaccine. The doses should be obtained at least 2 months apart.  Microbiologists working with certain meningococcal bacteria, military recruits, people at risk during an outbreak, and people who travel to or live in countries with a high rate of meningitis should be immunized. A first-year college student up through age   21 years who is living in a residence hall should receive a dose if she did not receive a dose on or after her 16th birthday. Adults who have certain high-risk conditions should receive one or more doses of vaccine.  Hepatitis A vaccine. Adults who wish to be protected from this disease, have certain high-risk conditions, work with hepatitis A-infected animals, work in hepatitis A research labs, or travel to or work in countries with a high rate of hepatitis A should be immunized. Adults who were previously unvaccinated and who anticipate close contact with an international adoptee during the first 60 days after arrival in the [place] from a country with a high rate of hepatitis A should be immunized.  Hepatitis B vaccine. Adults who wish to be protected from this disease, have certain high-risk conditions, may be exposed to blood or other infectious body fluids, are household contacts or sex partners of hepatitis B positive people, are clients or workers in certain care facilities, or travel to or work in countries with a high rate of hepatitis B should be immunized.  Haemophilus influenzae type b (Hib) vaccine. A previously unvaccinated person with asplenia or sickle cell disease or having a scheduled splenectomy should receive 1 dose of Hib vaccine. Regardless of previous immunization, a recipient of a hematopoietic stem cell transplant should receive a 3-dose series 6-12 months after her successful transplant. Hib vaccine is not recommended for adults with HIV infection. Preventive Services / Frequency Ages 64 to 68 years  Blood pressure check.** / Every 1 to 2 years.  Lipid and cholesterol check.** / Every 5 years beginning at age  22.  Clinical breast exam.** / Every 3 years for women in their 88s and 53s.  BRCA-related cancer risk assessment.** / For women who have family members with a BRCA-related cancer (breast, ovarian, tubal, or peritoneal cancers).  Pap test.** / Every 2 years from ages 90 through 51. Every 3 years starting at age 21 through age 56 or 3 with a history of 3 consecutive normal Pap tests.  HPV screening.** / Every 3 years from ages 24 through ages 1 to 46 with a history of 3 consecutive normal Pap tests.  Hepatitis C blood test.** / For any individual with known risks for hepatitis C.  Skin self-exam. / Monthly.  Influenza vaccine. / Every year.  Tetanus, diphtheria, and acellular pertussis (Tdap, Td) vaccine.** / Consult your health care provider. Pregnant women should receive 1 dose of Tdap vaccine during each pregnancy. 1 dose of Td every 10 years.  Varicella vaccine.** / Consult your health care provider. Pregnant females who do not have evidence of immunity should receive the first dose after pregnancy.  HPV vaccine. / 3 doses over 6 months, if 72 and [place]. The vaccine is not recommended for use in pregnant females. However, pregnancy testing is not needed before receiving a dose.  Measles, mumps, rubella (MMR) vaccine.** / You need at least 1 dose of MMR if you were born in 1957 or later. You may also need a 2nd dose. For females of childbearing age, rubella immunity should be determined. If there is no evidence of immunity, females who are not pregnant should be vaccinated. If there is no evidence of immunity, females who are pregnant should delay immunization until after pregnancy.  Pneumococcal 13-valent conjugate (PCV13) vaccine.** / Consult your health care provider.  Pneumococcal polysaccharide (PPSV23) vaccine.** / 1 to 2 doses if you smoke cigarettes or if you have certain conditions.  Meningococcal vaccine.** /  1 dose if you are age 19 to 21 years and a first-year college  student living in a residence hall, or have one of several medical conditions, you need to get vaccinated against meningococcal disease. You may also need additional booster doses.  Hepatitis A vaccine.** / Consult your health care provider.  Hepatitis B vaccine.** / Consult your health care provider.  Haemophilus influenzae type b (Hib) vaccine.** / Consult your health care provider. Ages 40 to 64 years  Blood pressure check.** / Every 1 to 2 years.  Lipid and cholesterol check.** / Every 5 years beginning at age 20 years.  Lung cancer screening. / Every year if you are aged 55-80 years and have a 30-pack-year history of smoking and currently smoke or have quit within the past 15 years. Yearly screening is stopped once you have quit smoking for at least 15 years or develop a health problem that would prevent you from having lung cancer treatment.  Clinical breast exam.** / Every year after age 40 years.  BRCA-related cancer risk assessment.** / For women who have family members with a BRCA-related cancer (breast, ovarian, tubal, or peritoneal cancers).  Mammogram.** / Every year beginning at age 40 years and continuing for as long as you are in good health. Consult with your health care provider.  Pap test.** / Every 3 years starting at age 30 years through age 65 or 70 years with a history of 3 consecutive normal Pap tests.  HPV screening.** / Every 3 years from ages 30 years through ages 65 to 70 years with a history of 3 consecutive normal Pap tests.  Fecal occult blood test (FOBT) of stool. / Every year beginning at age 50 years and continuing until age 75 years. You may not need to do this test if you get a colonoscopy every 10 years.  Flexible sigmoidoscopy or colonoscopy.** / Every 5 years for a flexible sigmoidoscopy or every 10 years for a colonoscopy beginning at age 50 years and continuing until age 75 years.  Hepatitis C blood test.** / For all people born from 1945 through  1965 and any individual with known risks for hepatitis C.  Skin self-exam. / Monthly.  Influenza vaccine. / Every year.  Tetanus, diphtheria, and acellular pertussis (Tdap/Td) vaccine.** / Consult your health care provider. Pregnant women should receive 1 dose of Tdap vaccine during each pregnancy. 1 dose of Td every 10 years.  Varicella vaccine.** / Consult your health care provider. Pregnant females who do not have evidence of immunity should receive the first dose after pregnancy.  Zoster vaccine.** / 1 dose for adults aged 60 years or older.  Measles, mumps, rubella (MMR) vaccine.** / You need at least 1 dose of MMR if you were born in 1957 or later. You may also need a 2nd dose. For females of childbearing age, rubella immunity should be determined. If there is no evidence of immunity, females who are not pregnant should be vaccinated. If there is no evidence of immunity, females who are pregnant should delay immunization until after pregnancy.  Pneumococcal 13-valent conjugate (PCV13) vaccine.** / Consult your health care provider.  Pneumococcal polysaccharide (PPSV23) vaccine.** / 1 to 2 doses if you smoke cigarettes or if you have certain conditions.  Meningococcal vaccine.** / Consult your health care provider.  Hepatitis A vaccine.** / Consult your health care provider.  Hepatitis B vaccine.** / Consult your health care provider.  Haemophilus influenzae type b (Hib) vaccine.** / Consult your health care provider. Ages 65   years and over  Blood pressure check.** / Every 1 to 2 years.  Lipid and cholesterol check.** / Every 5 years beginning at age 22 years.  Lung cancer screening. / Every year if you are aged 73-80 years and have a 30-pack-year history of smoking and currently smoke or have quit within the past 15 years. Yearly screening is stopped once you have quit smoking for at least 15 years or develop a health problem that would prevent you from having lung cancer  treatment.  Clinical breast exam.** / Every year after age 4 years.  BRCA-related cancer risk assessment.** / For women who have family members with a BRCA-related cancer (breast, ovarian, tubal, or peritoneal cancers).  Mammogram.** / Every year beginning at age 40 years and continuing for as long as you are in good health. Consult with your health care provider.  Pap test.** / Every 3 years starting at age 9 years through age 34 or 91 years with 3 consecutive normal Pap tests. Testing can be stopped between 65 and 70 years with 3 consecutive normal Pap tests and no abnormal Pap or HPV tests in the past 10 years.  HPV screening.** / Every 3 years from ages 57 years through ages 64 or 45 years with a history of 3 consecutive normal Pap tests. Testing can be stopped between 65 and 70 years with 3 consecutive normal Pap tests and no abnormal Pap or HPV tests in the past 10 years.  Fecal occult blood test (FOBT) of stool. / Every year beginning at age 15 years and continuing until age 17 years. You may not need to do this test if you get a colonoscopy every 10 years.  Flexible sigmoidoscopy or colonoscopy.** / Every 5 years for a flexible sigmoidoscopy or every 10 years for a colonoscopy beginning at age 86 years and continuing until age 71 years.  Hepatitis C blood test.** / For all people born from [birth year] through 1963 and any individual with known risks for hepatitis C.  Osteoporosis screening.** / A one-time screening for women ages 83 years and over and women at risk for fractures or osteoporosis.  Skin self-exam. / Monthly.  Influenza vaccine. / Every year.  Tetanus, diphtheria, and acellular pertussis (Tdap/Td) vaccine.** / 1 dose of Td every 10 years.  Varicella vaccine.** / Consult your health care provider.  Zoster vaccine.** / 1 dose for adults aged 61 years or older.  Pneumococcal 13-valent conjugate (PCV13) vaccine.** / Consult your health care provider.  Pneumococcal  polysaccharide (PPSV23) vaccine.** / 1 dose for all adults aged 28 years and older.  Meningococcal vaccine.** / Consult your health care provider.  Hepatitis A vaccine.** / Consult your health care provider.  Hepatitis B vaccine.** / Consult your health care provider.  Haemophilus influenzae type b (Hib) vaccine.** / Consult your health care provider. ** Family history and personal history of risk and conditions may change your health care provider's recommendations. Document Released: 08/26/2001 Document Revised: 11/14/2013 Document Reviewed: 11/25/2010 [facility] Patient Information 2015 [place], [place]. This information is not intended to replace advice given to you by your health care provider. Make sure you discuss any questions you have with your health care provider.

## 2014-07-21 NOTE — Progress Notes (Signed)
Subjective:     Jasmine Hinton is a 44 y.o. female and is here for a comprehensive physical exam. The patient reports problems - vaginal itching.   HYPERTENSION  Blood pressure range-not checking  Chest pain- no      Dyspnea- no Lightheadedness- no   Edema- no Other side effects - no   Medication compliance: poor Low salt diet- yes  DIABETES  Blood Sugar ranges-not checking  Polyuria- no New Visual problems- no Hypoglycemic symptoms- no Other side effects-no Medication compliance -poor Last eye exam- due Foot exam- today  HYPERLIPIDEMIA  Medication compliance- poor RUQ pain- no  Muscle aches- no Other side effects-no  History   Social History  . Marital Status: Legally Separated    Spouse Name: N/A    Number of Children: N/A  . Years of Education: N/A   Occupational History  . unemployed    Social History Main Topics  . Smoking status: Never Smoker   . Smokeless tobacco: Never Used  . Alcohol Use: Yes     Comment: rare  . Drug Use: No  . Sexual Activity:    Partners: Male    Birth Control/ Protection: IUD     Comment: one partner currently   Other Topics Concern  . Not on file   Social History Narrative   Health Maintenance  Topic Date Due  . PNEUMOCOCCAL POLYSACCHARIDE VACCINE (1) 06/27/1975  . OPHTHALMOLOGY EXAM  09/27/2010  . FOOT EXAM  10/17/2010  . URINE MICROALBUMIN  10/22/2013  . INFLUENZA VACCINE  02/11/2014  . HEMOGLOBIN A1C  11/29/2014  . PAP SMEAR  10/23/2015  . TETANUS/TDAP  09/08/2018    The following portions of the patient's history were reviewed and updated as appropriate:  She  has a past medical history of Depression; Hypertension; Diabetes mellitus; and Anxiety. She  does not have any pertinent problems on file. She  has past surgical history that includes Tonsillectomy. Her family history includes Colon cancer (age of onset: 31) in her maternal grandfather; Depression in an other family member; Diabetes in an other family  member; Hypertension in an other family member. She  reports that she has never smoked. She has never used smokeless tobacco. She reports that she drinks alcohol. She reports that she does not use illicit drugs. She has a current medication list which includes the following prescription(s): albuterol, atorvastatin, citalopram, clonazepam, fluticasone, glucose blood, freestyle, levonorgestrel, lisinopril-hydrochlorothiazide, metformin, and onetouch delica lancets. Current Outpatient Prescriptions on File Prior to Visit  Medication Sig Dispense Refill  . citalopram (CELEXA) 20 MG tablet TAKE ONE-HALF TABLET BY MOUTH ONCE DAILY 90 tablet 0  . clonazePAM (KLONOPIN) 1 MG tablet 1 po tid prn 30 tablet 1  . fluticasone (FLONASE) 50 MCG/ACT nasal spray Place 2 sprays into the nose daily.      . Lancets (FREESTYLE) lancets 1 each by Other route as needed. Use as instructed     . levonorgestrel (MIRENA) 20 MCG/24HR IUD 1 each by Intrauterine route once.      Marland Kitchen lisinopril-hydrochlorothiazide (PRINZIDE,ZESTORETIC) 10-12.5 MG per tablet TAKE ONE TABLET BY MOUTH ONCE DAILY 90 tablet 0  . ONETOUCH DELICA LANCETS MISC 1 tablet by Does not apply route.       No current facility-administered medications on file prior to visit.   She has No Known Allergies..  Review of Systems Review of Systems  Constitutional: Negative for activity change, appetite change and fatigue.  HENT: Negative for hearing loss, congestion, tinnitus and ear discharge.  dentist q39m Eyes: Negative for visual disturbance (see optho q1y -- vision corrected to 20/20 with glasses).  Respiratory: Negative for cough, chest tightness and shortness of breath.   Cardiovascular: Negative for chest pain, palpitations and leg swelling.  Gastrointestinal: Negative for abdominal pain, diarrhea, constipation and abdominal distention.  Genitourinary: Negative for urgency, frequency, decreased urine volume and difficulty urinating.  Musculoskeletal:  Negative for back pain, arthralgias and gait problem.  Skin: Negative for color change, pallor and rash.  Neurological: Negative for dizziness, light-headedness, numbness and headaches.  Hematological: Negative for adenopathy. Does not bruise/bleed easily.  Psychiatric/Behavioral: Negative for suicidal ideas, confusion, sleep disturbance, self-injury, dysphoric mood, decreased concentration and agitation.      Objective:    BP 134/84 mmHg  Pulse 116  Temp(Src) 99.2 F (37.3 C) (Oral)  Resp 18  Ht 5' 4.5" (1.638 m)  Wt 224 lb 3.2 oz (101.696 kg)  BMI 37.90 kg/m2  SpO2 99% General appearance: alert, cooperative, appears stated age and no distress Head: Normocephalic, without obvious abnormality, atraumatic Eyes: conjunctivae/corneas clear. PERRL, EOM's intact. Fundi benign. Ears: normal TM's and external ear canals both ears Nose: Nares normal. Septum midline. Mucosa normal. No drainage or sinus tenderness. Throat: lips, mucosa, and tongue normal; teeth and gums normal Neck: no adenopathy, no carotid bruit, no JVD, supple, symmetrical, trachea midline and thyroid not enlarged, symmetric, no tenderness/mass/nodules Back: symmetric, no curvature. ROM normal. No CVA tenderness. Lungs: clear to auscultation bilaterally Breasts: normal appearance, no masses or tenderness Heart: regular rate and rhythm, S1, S2 normal, no murmur, click, rub or gallop Abdomen: soft, non-tender; bowel sounds normal; no masses,  no organomegaly Pelvic: cervix normal in appearance, external genitalia normal, no adnexal masses or tenderness, no cervical motion tenderness, positive findings: vaginal discharge:  white and thin, rectovaginal septum normal and uterus normal size, shape, and consistency Extremities: extremities normal, atraumatic, no cyanosis or edema Pulses: 2+ and symmetric Skin: Skin color, texture, turgor normal. No rashes or lesions Lymph nodes: Cervical, supraclavicular, and axillary nodes  normal. Neurologic: Alert and oriented X 3, normal strength and tone. Normal symmetric reflexes. Normal coordination and gait Psych-- no depression, no anxiety     Sensory exam of the foot is normal, tested with the monofilament. Good pulses, no lesions or ulcers, good peripheral pulses.   Assessment:    Healthy female exam.      Plan:    ghm utd Check labs See After Visit Summary for Counseling Recommendations    1. Diabetes mellitus type II, uncontrolled Pt has not been taking meds--- discussed with pt improtance of taking meds and check ing glucose - metFORMIN (GLUCOPHAGE) 1000 MG tablet; TAKE ONE TABLET BY MOUTH TWICE DAILY WITH FOOD  Dispense: 60 tablet; Refill: 0 - glucose blood (ONE TOUCH ULTRA TEST) test strip; TEST BLOOD SUGAR 2 TIMES A DAY.  Dispense: 100 each; Refill: 3 - Ambulatory referral to diabetic education - CBC with Differential - POCT urinalysis dipstick - TSH - POCT Glucose (CBG)  2. Hyperlipidemia Check labs - atorvastatin (LIPITOR) 20 MG tablet; Take 1 tablet (20 mg total) by mouth daily.  Dispense: 30 tablet; Refill: 2 - CBC with Differential - POCT urinalysis dipstick - TSH  3. Breast cancer screening   - MM DIGITAL SCREENING BILATERAL; Future  4. Preventative health care   - CBC with Differential - POCT urinalysis dipstick - TSH - Urine Microalbumin w/creat. ratio; Future  5. Essential hypertension con't lisinopril - CBC with Differential - POCT urinalysis dipstick - TSH  6. Low back pain with sciatica, sciatica laterality unspecified, unspecified back pain laterality  - NONFORMULARY OR COMPOUNDED ITEM; Tens unit  #1  As directed  Dispense: 1 each; Refill: 0  7. Pap smear for cervical cancer screening  - Cytology - PAP  8. Urgency of urination  - Urine Culture  9. Need for prophylactic vaccination against Streptococcus pneumoniae (pneumococcus)  - Pneumococcal polysaccharide vaccine 23-valent greater than or equal to 2yo  subcutaneous/IM

## 2014-07-21 NOTE — Progress Notes (Signed)
Pre visit review using our clinic review tool, if applicable. No additional management support is needed unless otherwise documented below in the visit note. 

## 2014-07-24 LAB — URINE CULTURE

## 2014-07-25 LAB — CYTOLOGY - PAP

## 2014-07-26 ENCOUNTER — Encounter: Payer: Self-pay | Admitting: *Deleted

## 2014-08-10 ENCOUNTER — Encounter: Payer: BLUE CROSS/BLUE SHIELD | Attending: Family Medicine | Admitting: Dietician

## 2014-08-10 VITALS — Ht 64.0 in | Wt 222.0 lb

## 2014-08-10 DIAGNOSIS — Z713 Dietary counseling and surveillance: Secondary | ICD-10-CM | POA: Diagnosis not present

## 2014-08-10 DIAGNOSIS — E119 Type 2 diabetes mellitus without complications: Secondary | ICD-10-CM | POA: Diagnosis not present

## 2014-08-10 DIAGNOSIS — Z6838 Body mass index (BMI) 38.0-38.9, adult: Secondary | ICD-10-CM | POA: Diagnosis not present

## 2014-08-10 DIAGNOSIS — Z79899 Other long term (current) drug therapy: Secondary | ICD-10-CM | POA: Insufficient documentation

## 2014-08-10 NOTE — Progress Notes (Signed)
  Medical Nutrition Therapy:  Appt start time: 1030 end time:  8590.   Assessment:  Primary concerns today: newly diagnosed with DM in November. HgbA1C of 8.7% 05/2014. Weight has been stable.  Patient does shopping and cooking but mostly eats out. Lives with boyfriend.  Preferred Learning Style:   No preference indicated   Learning Readiness:   Ready  Change in progress   MEDICATIONS: see list   DIETARY INTAKE:  Avoided foods include red meat and pork.    24-hr recall:  B ( AM): v-8 juice (16 oz) or [place] sausage biscuit or breakfast burrito from [employer]. (skipped prior to November) Snk ( AM): nuts (handful) or goldfish  L ( PM): soup and roll or pizza or salad with roll ([employer] or [employer]) Snk ( PM): none or 16 pringles, or nuts or goldfish or chips and salsa D (8 PM): international foods ([place], asian) or pizza or frozen lasagne with bread Snk ( PM): none Beverages: water, seltzer water, coffee with flavored creamer, tea sometimes with raw sugar  Usual physical activity: used to walk (work schedule and early sundown effecting), interested in yoga and treadmill.  Estimated energy needs: 1800 calories 200 g carbohydrates 125 g protein 60 g fat  Progress Towards Goal(s):  In progress.   Nutritional Diagnosis:  NB-1.1 Food and nutrition-related knowledge deficit As related to balance of carbohydrates, protein and fat.  As evidenced by patient report and diet hx..    Intervention:  Nutrition counseling and diabetes education initiated. Discussed Carb Counting by food group as method of portion control, reading food labels, and benefits of increased activity. Also discussed basic physiology of Diabetes, target BG ranges pre and post meals, and A1c.   Add exercise:  Goal 150 minutes per week for health, double that for weight loss. Protein with each meal and snack Aim for 3-4 carbs at each meal Aim for 0-2 carbs at each snack Choose lower glycemic foods.  Teaching  Method Utilized:  Visual Auditory Hands on  Handouts given during visit include: Living Well with Diabetes Food Label handouts Meal Plan Card My plate placemat Nutrition in the fast lane A1C handout  Barriers to learning/adherence to lifestyle change: none  Demonstrated degree of understanding via:  Teach Back   Monitoring/Evaluation:  Dietary intake, exercise, label reading, and body weight prn.

## 2014-08-10 NOTE — Patient Instructions (Signed)
Add exercise:  Goal 150 minutes per week for health, double that for weight loss. Protein with each meal and snack Aim for 3-4 carbs at each meal Aim for 0-2 carbs at each snack Choose lower glycemic foods.

## 2014-08-15 ENCOUNTER — Other Ambulatory Visit: Payer: Self-pay

## 2014-08-15 MED ORDER — CITALOPRAM HYDROBROMIDE 20 MG PO TABS: 10.0000 mg | ORAL_TABLET | Freq: Every day | ORAL | Status: DC

## 2014-08-16 ENCOUNTER — Ambulatory Visit: Payer: BLUE CROSS/BLUE SHIELD

## 2014-08-21 ENCOUNTER — Other Ambulatory Visit: Payer: Self-pay

## 2014-08-21 MED ORDER — LISINOPRIL-HYDROCHLOROTHIAZIDE 10-12.5 MG PO TABS: 1.0000 | ORAL_TABLET | Freq: Every day | ORAL | Status: DC

## 2014-08-30 ENCOUNTER — Other Ambulatory Visit: Payer: Self-pay | Admitting: Family Medicine

## 2014-08-30 ENCOUNTER — Ambulatory Visit
Admission: RE | Admit: 2014-08-30 | Discharge: 2014-08-30 | Disposition: A | Discharge status: Home / Self Care | Payer: BLUE CROSS/BLUE SHIELD | Source: Ambulatory Visit | Attending: Family Medicine | Admitting: Family Medicine

## 2014-08-30 DIAGNOSIS — Z1239 Encounter for other screening for malignant neoplasm of breast: Secondary | ICD-10-CM

## 2014-08-30 DIAGNOSIS — R928 Other abnormal and inconclusive findings on diagnostic imaging of breast: Secondary | ICD-10-CM

## 2014-09-06 ENCOUNTER — Ambulatory Visit
Admission: RE | Admit: 2014-09-06 | Discharge: 2014-09-06 | Disposition: A | Discharge status: Home / Self Care | Payer: BLUE CROSS/BLUE SHIELD | Source: Ambulatory Visit | Attending: Family Medicine | Admitting: Family Medicine

## 2014-09-06 DIAGNOSIS — R928 Other abnormal and inconclusive findings on diagnostic imaging of breast: Secondary | ICD-10-CM

## 2014-10-20 ENCOUNTER — Ambulatory Visit: Payer: BLUE CROSS/BLUE SHIELD | Admitting: Family Medicine

## 2014-11-28 ENCOUNTER — Ambulatory Visit: Payer: BLUE CROSS/BLUE SHIELD | Admitting: Family Medicine

## 2014-11-28 ENCOUNTER — Encounter: Payer: Self-pay | Admitting: Family Medicine

## 2014-11-28 ENCOUNTER — Ambulatory Visit (INDEPENDENT_AMBULATORY_CARE_PROVIDER_SITE_OTHER): Payer: BLUE CROSS/BLUE SHIELD | Admitting: Family Medicine

## 2014-11-28 VITALS — BP 100/62 | HR 97 | Temp 98.0°F | Wt 212.8 lb

## 2014-11-28 DIAGNOSIS — F410 Panic disorder [episodic paroxysmal anxiety] without agoraphobia: Secondary | ICD-10-CM

## 2014-11-28 DIAGNOSIS — F418 Other specified anxiety disorders: Secondary | ICD-10-CM | POA: Diagnosis not present

## 2014-11-28 DIAGNOSIS — J0110 Acute frontal sinusitis, unspecified: Secondary | ICD-10-CM | POA: Diagnosis not present

## 2014-11-28 MED ORDER — CLONAZEPAM 1 MG PO TABS: ORAL_TABLET | ORAL | Status: DC

## 2014-11-28 MED ORDER — AMOXICILLIN-POT CLAVULANATE 875-125 MG PO TABS
1.0000 | ORAL_TABLET | Freq: Two times a day (BID) | ORAL | Status: DC
Start: 2014-11-28 — End: 2015-10-18

## 2014-11-28 MED ORDER — FLUTICASONE PROPIONATE 50 MCG/ACT NA SUSP: 2.0000 | Freq: Every day | NASAL | Status: DC

## 2014-11-28 NOTE — Progress Notes (Signed)
Patient ID: FIDELIS LOTH, female    DOB: [birth year]  Age: 44 y.o. MRN: [id number]    Subjective:  Subjective HPI Remy Dia Heenan presents for f/u anxiety/ panic attacks.  They have worsened due to co worker dying at 30 from asthma attack.   She is also c/o ear pain and congestion. X 2 weeks.   + fever-- low grade.  + sinus pressure and nasal congestion.  Review of Systems  Constitutional: Positive for chills. Negative for fever and fatigue.  HENT: Positive for congestion, ear pain, postnasal drip, rhinorrhea, sinus pressure and tinnitus.   Respiratory: Negative for cough, chest tightness, shortness of breath and wheezing.   Cardiovascular: Negative for chest pain, palpitations and leg swelling.  Allergic/Immunologic: Negative for environmental allergies.  Psychiatric/Behavioral: Negative for decreased concentration. The patient is not nervous/anxious and is not hyperactive.     History Past Medical History  Diagnosis Date  . Depression   . Hypertension   . Diabetes mellitus   . Anxiety     She has past surgical history that includes Tonsillectomy.   Her family history includes Colon cancer (age of onset: 49) in her maternal grandfather; Depression in an other family member; Diabetes in an other family member; Hypertension in an other family member.She reports that she has never smoked. She has never used smokeless tobacco. She reports that she drinks alcohol. She reports that she does not use illicit drugs.  Current Outpatient Prescriptions on File Prior to Visit  Medication Sig Dispense Refill  . albuterol (PROVENTIL HFA;VENTOLIN HFA) 108 (90 BASE) MCG/ACT inhaler Inhale 1 puff into the lungs every 6 (six) hours as needed for wheezing or shortness of breath.    Marland Kitchen atorvastatin (LIPITOR) 20 MG tablet Take 1 tablet (20 mg total) by mouth daily. 30 tablet 2  . citalopram (CELEXA) 20 MG tablet Take 0.5 tablets (10 mg total) by mouth daily. 90 tablet 1  . glucose blood (ONE TOUCH  ULTRA TEST) test strip TEST BLOOD SUGAR 2 TIMES A DAY. 100 each 3  . Lancets (FREESTYLE) lancets 1 each by Other route as needed. Use as instructed     . levonorgestrel (MIRENA) 20 MCG/24HR IUD 1 each by Intrauterine route once.      Marland Kitchen lisinopril-hydrochlorothiazide (PRINZIDE,ZESTORETIC) 10-12.5 MG per tablet Take 1 tablet by mouth daily. 90 tablet 1  . metFORMIN (GLUCOPHAGE) 1000 MG tablet TAKE ONE TABLET BY MOUTH TWICE DAILY WITH FOOD 60 tablet 0  . NONFORMULARY OR COMPOUNDED ITEM Tens unit  #1  As directed 1 each 0  . ONETOUCH DELICA LANCETS MISC 1 tablet by Does not apply route.       No current facility-administered medications on file prior to visit.     Objective:  Objective Physical Exam  Constitutional: She is oriented to person, place, and time. She appears well-developed and well-nourished.  HENT:  Right Ear: Hearing, tympanic membrane, external ear and ear canal normal.  Left Ear: Hearing, tympanic membrane, external ear and ear canal normal.  Nose: Right sinus exhibits maxillary sinus tenderness and frontal sinus tenderness. Left sinus exhibits maxillary sinus tenderness and frontal sinus tenderness.  + PND + errythema  Eyes: Conjunctivae are normal. Right eye exhibits no discharge. Left eye exhibits no discharge.  Cardiovascular: Normal rate, regular rhythm and normal heart sounds.   No murmur heard. Pulmonary/Chest: Effort normal and breath sounds normal. No respiratory distress. She has no wheezes. She has no rales. She exhibits no tenderness.  Musculoskeletal: She exhibits no edema.  Lymphadenopathy:    She has cervical adenopathy.  Neurological: She is alert and oriented to person, place, and time.  Psychiatric: She has a normal mood and affect. Her behavior is normal. Thought content normal.   BP 100/62 mmHg  Pulse 97  Temp(Src) 98 F (36.7 C) (Oral)  Wt 212 lb 12.8 oz (96.525 kg)  SpO2 99% Wt Readings from Last 3 Encounters:  11/28/14 212 lb 12.8 oz (96.525  kg)  08/10/14 222 lb (100.699 kg)  07/21/14 224 lb 3.2 oz (101.696 kg)     Lab Results  Component Value Date   WBC 4.1* 10/22/2012   HGB 12.9 10/22/2012   HCT 38.3 10/22/2012   PLT 289.0 10/22/2012   GLUCOSE 188* 05/31/2014   CHOL 204* 05/31/2014   TRIG 75.0 05/31/2014   HDL 45.70 05/31/2014   LDLCALC 143* 05/31/2014   ALT 42* 05/31/2014   AST 24 05/31/2014   NA 143 05/31/2014   K 3.4* 05/31/2014   CL 107 05/31/2014   CREATININE 0.5 05/31/2014   BUN 7 05/31/2014   CO2 26 05/31/2014   TSH 1.39 10/22/2012   HGBA1C 8.7* 05/31/2014   MICROALBUR 1.4 10/22/2012    Mm Digital Diagnostic Unilat R  09/06/2014   CLINICAL DATA:  Possible upper outer right breast asymmetry on a baseline screening mammogram.  EXAM: DIGITAL DIAGNOSTIC RIGHT MAMMOGRAM  ULTRASOUND RIGHT BREAST  COMPARISON:  Screening mammogram dated 08/30/2014.  ACR Breast Density Category b: There are scattered areas of fibroglandular density.  FINDINGS: Spot compression views of the right breast confirm a focal asymmetry in the upper-outer quadrant. This has an appearance similar to smaller areas of fibroglandular tissue elsewhere in both breasts. This area measures 4.2 x 2.3 x 1.9 cm mammographically.  On physical exam, no mass is palpable in the upper outer right breast.  Targeted ultrasound is performed, showing an island of normal appearing dense glandular tissue in the 10-10:30 o'clock position of the right breast, 7 cm from the nipple. This corresponds to the mammographic asymmetry.  IMPRESSION: No evidence of malignancy. The focal asymmetry in the upper outer right breast is an [place] of normal dense glandular tissue.  RECOMMENDATION: Bilateral screening mammogram in 1 year.  I have discussed the findings and recommendations with the patient. Results were also provided in writing at the conclusion of the visit. If applicable, a reminder letter will be sent to the patient regarding the next appointment.  BI-RADS CATEGORY  1:  Negative.   Electronically Signed   By: Claudie Revering M.D.   On: 09/06/2014 11:03   US Breast Ltd Uni Right Inc Axilla  09/06/2014   CLINICAL DATA:  Possible upper outer right breast asymmetry on a baseline screening mammogram.  EXAM: DIGITAL DIAGNOSTIC RIGHT MAMMOGRAM  ULTRASOUND RIGHT BREAST  COMPARISON:  Screening mammogram dated 08/30/2014.  ACR Breast Density Category b: There are scattered areas of fibroglandular density.  FINDINGS: Spot compression views of the right breast confirm a focal asymmetry in the upper-outer quadrant. This has an appearance similar to smaller areas of fibroglandular tissue elsewhere in both breasts. This area measures 4.2 x 2.3 x 1.9 cm mammographically.  On physical exam, no mass is palpable in the upper outer right breast.  Targeted ultrasound is performed, showing an island of normal appearing dense glandular tissue in the 10-10:30 o'clock position of the right breast, 7 cm from the nipple. This corresponds to the mammographic asymmetry.  IMPRESSION: No evidence of malignancy. The focal asymmetry in the upper outer right  breast is an [place] of normal dense glandular tissue.  RECOMMENDATION: Bilateral screening mammogram in 1 year.  I have discussed the findings and recommendations with the patient. Results were also provided in writing at the conclusion of the visit. If applicable, a reminder letter will be sent to the patient regarding the next appointment.  BI-RADS CATEGORY  1: Negative.   Electronically Signed   By: Claudie Revering M.D.   On: 09/06/2014 11:03     Assessment & Plan:  Plan I have changed Ms. Jons's fluticasone. I am also having her start on amoxicillin-clavulanate. Additionally, I am having her maintain her freestyle, levonorgestrel, ONETOUCH DELICA LANCETS, albuterol, metFORMIN, atorvastatin, glucose blood, NONFORMULARY OR COMPOUNDED ITEM, citalopram, lisinopril-hydrochlorothiazide, and clonazePAM.  Meds ordered this encounter  Medications  .  clonazePAM (KLONOPIN) 1 MG tablet    Sig: 1 po tid prn    Dispense:  30 tablet    Refill:  1  . amoxicillin-clavulanate (AUGMENTIN) 875-125 MG per tablet    Sig: Take 1 tablet by mouth 2 (two) times daily.    Dispense:  20 tablet    Refill:  0  . fluticasone (FLONASE) 50 MCG/ACT nasal spray    Sig: Place 2 sprays into both nostrils daily.    Dispense:  16 g    Refill:  5    Problem List Items Addressed This Visit    Acute sinusitis - Primary    flonase abx per orders Antihistamine rto prn      Relevant Medications   amoxicillin-clavulanate (AUGMENTIN) 875-125 MG per tablet   fluticasone (FLONASE) 50 MCG/ACT nasal spray    Other Visit Diagnoses    Severe anxiety with panic        Depression with anxiety        Relevant Medications    clonazePAM (KLONOPIN) 1 MG tablet     pt given number for K Troxler to start counseling again Follow-up: Return if symptoms worsen or fail to improve.  Garnet Koyanagi, DO

## 2014-11-28 NOTE — Patient Instructions (Signed)
Generalized Anxiety Disorder Generalized anxiety disorder (GAD) is a mental disorder. It interferes with life functions, including relationships, work, and school. GAD is different from normal anxiety, which everyone experiences at some point in their lives in response to specific life events and activities. Normal anxiety actually helps us prepare for and get through these life events and activities. Normal anxiety goes away after the event or activity is over.  GAD causes anxiety that is not necessarily related to specific events or activities. It also causes excess anxiety in proportion to specific events or activities. The anxiety associated with GAD is also difficult to control. GAD can vary from mild to severe. People with severe GAD can have intense waves of anxiety with physical symptoms (panic attacks).  SYMPTOMS The anxiety and worry associated with GAD are difficult to control. This anxiety and worry are related to many life events and activities and also occur more days than not for 6 months or longer. People with GAD also have three or more of the following symptoms (one or more in children):  Restlessness.   Fatigue.  Difficulty concentrating.   Irritability.  Muscle tension.  Difficulty sleeping or unsatisfying sleep. DIAGNOSIS GAD is diagnosed through an assessment by your health care provider. Your health care provider will ask you questions aboutyour mood,physical symptoms, and events in your life. Your health care provider may ask you about your medical history and use of alcohol or drugs, including prescription medicines. Your health care provider may also do a physical exam and blood tests. Certain medical conditions and the use of certain substances can cause symptoms similar to those associated with GAD. Your health care provider may refer you to a mental health specialist for further evaluation. TREATMENT The following therapies are usually used to treat GAD:    Medication. Antidepressant medication usually is prescribed for long-term daily control. Antianxiety medicines may be added in severe cases, especially when panic attacks occur.   Talk therapy (psychotherapy). Certain types of talk therapy can be helpful in treating GAD by providing support, education, and guidance. A form of talk therapy called cognitive behavioral therapy can teach you healthy ways to think about and react to daily life events and activities.  Stress managementtechniques. These include yoga, meditation, and exercise and can be very helpful when they are practiced regularly. A mental health specialist can help determine which treatment is best for you. Some people see improvement with one therapy. However, other people require a combination of therapies. Document Released: 10/25/2012 Document Revised: 11/14/2013 Document Reviewed: 10/25/2012 Exit[employer] Patient Information 2015 [employer]. This information is not intended to replace advice given to you by your health care provider. Make sure you discuss any questions you have with your health care provider.  

## 2014-11-28 NOTE — Progress Notes (Signed)
Pre visit review using our clinic review tool, if applicable. No additional management support is needed unless otherwise documented below in the visit note. 

## 2014-11-28 NOTE — Assessment & Plan Note (Signed)
flonase abx per orders Antihistamine rto prn

## 2015-01-08 ENCOUNTER — Other Ambulatory Visit: Payer: Self-pay

## 2015-01-16 ENCOUNTER — Other Ambulatory Visit: Payer: Self-pay | Admitting: Family Medicine

## 2015-02-25 ENCOUNTER — Other Ambulatory Visit: Payer: Self-pay | Admitting: Family Medicine

## 2015-02-26 NOTE — Telephone Encounter (Signed)
Please schedule this patient an apt for Diabetes follow up, she will need to be fasting.     KP

## 2015-02-27 NOTE — Telephone Encounter (Signed)
Lm advising patient to schedule appointment

## 2015-08-30 ENCOUNTER — Telehealth: Payer: Self-pay | Admitting: Family Medicine

## 2015-08-30 MED ORDER — LISINOPRIL-HYDROCHLOROTHIAZIDE 10-12.5 MG PO TABS: 1.0000 | ORAL_TABLET | Freq: Every day | ORAL | Status: DC

## 2015-08-30 MED ORDER — METFORMIN HCL 1000 MG PO TABS: ORAL_TABLET | ORAL | Status: DC

## 2015-08-30 NOTE — Telephone Encounter (Signed)
Patient aware that the Rx has been faxed.   KP

## 2015-08-30 NOTE — Telephone Encounter (Signed)
Caller name: Mackensie   Relationship to patient: Self   Can be reached: [phone]  Reason for call: pt scheduled her a medication FU appt for March due to her work schedule. Pt would like to know if PCP could provide her with a Rx of 2 medications until pt is able to have appt. ( lisinopril, metFORMIN) Please advise further.

## 2015-09-28 ENCOUNTER — Ambulatory Visit: Payer: BLUE CROSS/BLUE SHIELD | Admitting: Family Medicine

## 2015-10-04 ENCOUNTER — Ambulatory Visit: Payer: BLUE CROSS/BLUE SHIELD | Admitting: Family Medicine

## 2015-10-11 ENCOUNTER — Ambulatory Visit: Payer: BLUE CROSS/BLUE SHIELD | Admitting: Family Medicine

## 2015-10-18 ENCOUNTER — Encounter: Payer: Self-pay | Admitting: Family Medicine

## 2015-10-18 ENCOUNTER — Ambulatory Visit (INDEPENDENT_AMBULATORY_CARE_PROVIDER_SITE_OTHER): Payer: BLUE CROSS/BLUE SHIELD | Admitting: Family Medicine

## 2015-10-18 VITALS — BP 106/73 | HR 104 | Temp 99.9°F | Ht 64.0 in | Wt 218.8 lb

## 2015-10-18 DIAGNOSIS — F418 Other specified anxiety disorders: Secondary | ICD-10-CM | POA: Diagnosis not present

## 2015-10-18 DIAGNOSIS — L819 Disorder of pigmentation, unspecified: Secondary | ICD-10-CM | POA: Diagnosis not present

## 2015-10-18 DIAGNOSIS — I10 Essential (primary) hypertension: Secondary | ICD-10-CM

## 2015-10-18 DIAGNOSIS — G5601 Carpal tunnel syndrome, right upper limb: Secondary | ICD-10-CM | POA: Diagnosis not present

## 2015-10-18 DIAGNOSIS — G5600 Carpal tunnel syndrome, unspecified upper limb: Secondary | ICD-10-CM | POA: Insufficient documentation

## 2015-10-18 MED ORDER — ALBUTEROL SULFATE HFA 108 (90 BASE) MCG/ACT IN AERS: 1.0000 | INHALATION_SPRAY | Freq: Four times a day (QID) | RESPIRATORY_TRACT | Status: DC | PRN

## 2015-10-18 MED ORDER — CLONAZEPAM 1 MG PO TABS: ORAL_TABLET | ORAL | Status: DC

## 2015-10-18 NOTE — Progress Notes (Signed)
Patient ID: Jasmine Hinton, female    DOB: 10-25-1970  Age: 45 y.o. MRN: [id number]    Subjective:  Subjective HPI Marva Kromer Glasper presents with c/o numbness and tingling in both hands x several months.  R > L.  She is also here for f/u DM  , bp.   She c/o hyperpigmentation of soles of feet --- not itching    Review of Systems  Constitutional: Negative for diaphoresis, appetite change, fatigue and unexpected weight change.  Eyes: Negative for pain, redness and visual disturbance.  Respiratory: Negative for cough, chest tightness, shortness of breath and wheezing.   Cardiovascular: Negative for chest pain, palpitations and leg swelling.  Endocrine: Negative for cold intolerance, heat intolerance, polydipsia, polyphagia and polyuria.  Genitourinary: Negative for dysuria, frequency and difficulty urinating.  Neurological: Negative for dizziness, light-headedness, numbness and headaches.    History Past Medical History  Diagnosis Date  . Depression   . Hypertension   . Diabetes mellitus   . Anxiety     She has past surgical history that includes Tonsillectomy.   Her family history includes Colon cancer (age of onset: 40) in her maternal grandfather.She reports that she has never smoked. She has never used smokeless tobacco. She reports that she drinks alcohol. She reports that she does not use illicit drugs.  Current Outpatient Prescriptions on File Prior to Visit  Medication Sig Dispense Refill  . citalopram (CELEXA) 20 MG tablet Take 0.5 tablets (10 mg total) by mouth daily. 90 tablet 1  . fluticasone (FLONASE) 50 MCG/ACT nasal spray Place 2 sprays into both nostrils daily. 16 g 5  . glucose blood (ONE TOUCH ULTRA TEST) test strip TEST BLOOD SUGAR 2 TIMES A DAY. 100 each 3  . Lancets (FREESTYLE) lancets 1 each by Other route as needed. Use as instructed     . levonorgestrel (MIRENA) 20 MCG/24HR IUD 1 each by Intrauterine route once.      Marland Kitchen lisinopril-hydrochlorothiazide  (PRINZIDE,ZESTORETIC) 10-12.5 MG tablet Take 1 tablet by mouth daily. 90 tablet 0  . metFORMIN (GLUCOPHAGE) 1000 MG tablet TAKE ONE TABLET BY MOUTH TWICE DAILY WITH A MEAL 180 tablet 0  . ONETOUCH DELICA LANCETS MISC 1 tablet by Does not apply route.       No current facility-administered medications on file prior to visit.     Objective:  Objective Physical Exam  Constitutional: She is oriented to person, place, and time. She appears well-developed and well-nourished.  HENT:  Head: Normocephalic and atraumatic.  Eyes: Conjunctivae and EOM are normal.  Neck: Normal range of motion. Neck supple. No JVD present. Carotid bruit is not present. No thyromegaly present.  Cardiovascular: Normal rate, regular rhythm and normal heart sounds.   No murmur heard. Pulmonary/Chest: Effort normal and breath sounds normal. No respiratory distress. She has no wheezes. She has no rales. She exhibits no tenderness.  Musculoskeletal: She exhibits no edema.  Neurological: She is alert and oriented to person, place, and time.  Psychiatric: She has a normal mood and affect.  Nursing note and vitals reviewed.  BP 106/73 mmHg  Pulse 104  Temp(Src) 99.9 F (37.7 C) (Oral)  Ht 5\' 4"  (1.626 m)  Wt 218 lb 12.8 oz (99.247 kg)  BMI 37.54 kg/m2  SpO2 98% Wt Readings from Last 3 Encounters:  10/18/15 218 lb 12.8 oz (99.247 kg)  11/28/14 212 lb 12.8 oz (96.525 kg)  08/10/14 222 lb (100.699 kg)     Lab Results  Component Value Date  WBC 4.1* 10/22/2012   HGB 12.9 10/22/2012   HCT 38.3 10/22/2012   PLT 289.0 10/22/2012   GLUCOSE 188* 05/31/2014   CHOL 204* 05/31/2014   TRIG 75.0 05/31/2014   HDL 45.70 05/31/2014   LDLCALC 143* 05/31/2014   ALT 42* 05/31/2014   AST 24 05/31/2014   NA 143 05/31/2014   K 3.4* 05/31/2014   CL 107 05/31/2014   CREATININE 0.5 05/31/2014   BUN 7 05/31/2014   CO2 26 05/31/2014   TSH 1.39 10/22/2012   HGBA1C 8.7* 05/31/2014   MICROALBUR 1.4 10/22/2012    Mm Digital  Diagnostic Unilat R  09/06/2014  CLINICAL DATA:  Possible upper outer right breast asymmetry on a baseline screening mammogram. EXAM: DIGITAL DIAGNOSTIC RIGHT MAMMOGRAM ULTRASOUND RIGHT BREAST COMPARISON:  Screening mammogram dated 08/30/2014. ACR Breast Density Category b: There are scattered areas of fibroglandular density. FINDINGS: Spot compression views of the right breast confirm a focal asymmetry in the upper-outer quadrant. This has an appearance similar to smaller areas of fibroglandular tissue elsewhere in both breasts. This area measures 4.2 x 2.3 x 1.9 cm mammographically. On physical exam, no mass is palpable in the upper outer right breast. Targeted ultrasound is performed, showing an island of normal appearing dense glandular tissue in the 10-10:30 o'clock position of the right breast, 7 cm from the nipple. This corresponds to the mammographic asymmetry. IMPRESSION: No evidence of malignancy. The focal asymmetry in the upper outer right breast is an [place] of normal dense glandular tissue. RECOMMENDATION: Bilateral screening mammogram in 1 year. I have discussed the findings and recommendations with the patient. Results were also provided in writing at the conclusion of the visit. If applicable, a reminder letter will be sent to the patient regarding the next appointment. BI-RADS CATEGORY  1: Negative. Electronically Signed   By: Claudie Revering M.D.   On: 09/06/2014 11:03   US Breast Ltd Uni Right Inc Axilla  09/06/2014  CLINICAL DATA:  Possible upper outer right breast asymmetry on a baseline screening mammogram. EXAM: DIGITAL DIAGNOSTIC RIGHT MAMMOGRAM ULTRASOUND RIGHT BREAST COMPARISON:  Screening mammogram dated 08/30/2014. ACR Breast Density Category b: There are scattered areas of fibroglandular density. FINDINGS: Spot compression views of the right breast confirm a focal asymmetry in the upper-outer quadrant. This has an appearance similar to smaller areas of fibroglandular tissue elsewhere  in both breasts. This area measures 4.2 x 2.3 x 1.9 cm mammographically. On physical exam, no mass is palpable in the upper outer right breast. Targeted ultrasound is performed, showing an island of normal appearing dense glandular tissue in the 10-10:30 o'clock position of the right breast, 7 cm from the nipple. This corresponds to the mammographic asymmetry. IMPRESSION: No evidence of malignancy. The focal asymmetry in the upper outer right breast is an [place] of normal dense glandular tissue. RECOMMENDATION: Bilateral screening mammogram in 1 year. I have discussed the findings and recommendations with the patient. Results were also provided in writing at the conclusion of the visit. If applicable, a reminder letter will be sent to the patient regarding the next appointment. BI-RADS CATEGORY  1: Negative. Electronically Signed   By: Claudie Revering M.D.   On: 09/06/2014 11:03     Assessment & Plan:  Plan I have discontinued Ms. Varble's atorvastatin, NONFORMULARY OR COMPOUNDED ITEM, and amoxicillin-clavulanate. I am also having her maintain her freestyle, levonorgestrel, ONETOUCH DELICA LANCETS, glucose blood, citalopram, fluticasone, metFORMIN, lisinopril-hydrochlorothiazide, albuterol, and clonazePAM.  Meds ordered this encounter  Medications  . albuterol (PROVENTIL  HFA;VENTOLIN HFA) 108 (90 Base) MCG/ACT inhaler    Sig: Inhale 1 puff into the lungs every 6 (six) hours as needed for wheezing or shortness of breath.    Dispense:  1 Inhaler    Refill:  5  . clonazePAM (KLONOPIN) 1 MG tablet    Sig: 1 po tid prn    Dispense:  30 tablet    Refill:  1    Problem List Items Addressed This Visit      Unprioritized   Carpal tunnel syndrome    Wear wrist splint  Refer to ortho if no better      Relevant Medications   clonazePAM (KLONOPIN) 1 MG tablet   Other Relevant Orders   Lipid panel   CBC with Differential/Platelet   Hemoglobin A1c   Comprehensive metabolic panel    Other Visit  Diagnoses    Essential hypertension  (Chronic)  (Active)  -  Primary    Relevant Medications    albuterol (PROVENTIL HFA;VENTOLIN HFA) 108 (90 Base) MCG/ACT inhaler    clonazePAM (KLONOPIN) 1 MG tablet    Other Relevant Orders    Lipid panel    CBC with Differential/Platelet    Hemoglobin A1c    Comprehensive metabolic panel    Depression with anxiety        Relevant Medications    clonazePAM (KLONOPIN) 1 MG tablet    Essential hypertension        Relevant Medications    albuterol (PROVENTIL HFA;VENTOLIN HFA) 108 (90 Base) MCG/ACT inhaler    clonazePAM (KLONOPIN) 1 MG tablet    Other Relevant Orders    Lipid panel    CBC with Differential/Platelet    Hemoglobin A1c    Comprehensive metabolic panel    Discoloration of skin of foot        Relevant Orders    Lipid panel    CBC with Differential/Platelet    Hemoglobin A1c    Comprehensive metabolic panel    RPR (Completed)    HIV antibody (with reflex) (Completed)       Follow-up: Return in about 6 months (around 04/18/2016), or if symptoms worsen or fail to improve.  Ann Held, DO

## 2015-10-18 NOTE — Assessment & Plan Note (Signed)
Wear wrist splint  Refer to ortho if no better

## 2015-10-18 NOTE — Patient Instructions (Signed)
Carpal Tunnel Release  Carpal tunnel release is a surgical procedure to relieve numbness and pain in your hand that are caused by carpal tunnel syndrome. Your carpal tunnel is a narrow, hollow space in your wrist. It passes between your wrist bones and a band of connective tissue (transverse carpal ligament). The nerve that supplies most of your hand (median nerve) passes through this space, and so do the connections between your fingers and the muscles of your arm (tendons). Carpal tunnel syndrome makes this space swell and become narrow, and this causes pain and numbness.  In carpal tunnel release surgery, a surgeon cuts through the transverse carpal ligament to make more room in the carpal tunnel space. You may have this surgery if other types of treatment have not worked.  LET YOUR HEALTH CARE PROVIDER KNOW ABOUT:  · Any allergies you have.  · All medicines you are taking, including vitamins, herbs, eye drops, creams, and over-the-counter medicines.  · Previous problems you or members of your family have had with the use of anesthetics.  · Any blood disorders you have.  · Previous surgeries you have had.  · Medical conditions you have.  RISKS AND COMPLICATIONS  Generally, this is a safe procedure. However, problems may occur, including:  · Bleeding.  · Infection.  · Injury to the median nerve.  · Need for additional surgery.  BEFORE THE PROCEDURE  · Ask your health care provider about:    Changing or stopping your regular medicines. This is especially important if you are taking diabetes medicines or blood thinners.    Taking medicines such as aspirin and ibuprofen. These medicines can thin your blood. Do not take these medicines before your procedure if your health care provider instructs you not to.  · Do not eat or drink anything after midnight on the night before the procedure or as directed by your health care provider.  · Plan to have someone take you home after the procedure.  PROCEDURE  · An IV tube may  be inserted into a vein.  · You will be given one of the following:    A medicine that numbs the wrist area (local anesthetic). You may also be given a medicine to make you relax (sedative).    A medicine that makes you go to sleep (general anesthetic).  · Your arm, hand, and wrist will be cleaned with a germ-killing solution (antiseptic).  · Your surgeon will make a surgical cut (incision) over the palm side of your wrist. The surgeon will pull aside the skin of your wrist to expose the carpal tunnel space.  · The surgeon will cut the transverse carpal ligament.  · The edges of the incision will be closed with stitches (sutures) or staples.  · A bandage (dressing) will be placed over your wrist and wrapped around your hand and wrist.  AFTER THE PROCEDURE  · You may spend some time in a recovery area.  · Your blood pressure, heart rate, breathing rate, and blood oxygen level will be monitored often until the medicines you were given have worn off.  · You will likely have some pain. You will be given pain medicine.  · You may need to wear a splint or a wrist brace over your dressing.     This information is not intended to replace advice given to you by your health care provider. Make sure you discuss any questions you have with your health care provider.     Document Released: 

## 2015-10-18 NOTE — Progress Notes (Signed)
Pre visit review using our clinic review tool, if applicable. No additional management support is needed unless otherwise documented below in the visit note. 

## 2015-10-19 LAB — HIV ANTIBODY (ROUTINE TESTING W REFLEX): HIV: NONREACTIVE

## 2015-10-19 LAB — RPR

## 2015-10-22 ENCOUNTER — Telehealth: Payer: Self-pay | Admitting: *Deleted

## 2015-10-22 MED ORDER — ALBUTEROL SULFATE HFA 108 (90 BASE) MCG/ACT IN AERS: 2.0000 | INHALATION_SPRAY | Freq: Four times a day (QID) | RESPIRATORY_TRACT | Status: DC | PRN

## 2015-10-22 NOTE — Addendum Note (Signed)
Addended by: Murtis Sink A on: 10/22/2015 02:57 PM   Modules accepted: Orders, Medications

## 2015-10-22 NOTE — Telephone Encounter (Signed)
proair hfa 2 puffs qid prn #1  2 refills

## 2015-10-22 NOTE — Telephone Encounter (Signed)
Med e-scribed to [facility] on [address]. JG//CMA

## 2015-10-22 NOTE — Telephone Encounter (Signed)
Received fax from pharmacy stating insurance will not pay for Proventil, but will pay for Proair HFA. Please advise. JG//CMA

## 2015-11-01 ENCOUNTER — Other Ambulatory Visit (INDEPENDENT_AMBULATORY_CARE_PROVIDER_SITE_OTHER): Payer: BLUE CROSS/BLUE SHIELD

## 2015-11-01 DIAGNOSIS — I10 Essential (primary) hypertension: Secondary | ICD-10-CM

## 2015-11-01 DIAGNOSIS — G5601 Carpal tunnel syndrome, right upper limb: Secondary | ICD-10-CM | POA: Diagnosis not present

## 2015-11-01 DIAGNOSIS — L819 Disorder of pigmentation, unspecified: Secondary | ICD-10-CM | POA: Diagnosis not present

## 2015-11-01 LAB — CBC WITH DIFFERENTIAL/PLATELET
BASOS PCT: 0.7 % (ref 0.0–3.0)
Basophils Absolute: 0 10*3/uL (ref 0.0–0.1)
EOS PCT: 1.4 % (ref 0.0–5.0)
Eosinophils Absolute: 0.1 10*3/uL (ref 0.0–0.7)
HCT: 39.9 % (ref 36.0–46.0)
Hemoglobin: 13.4 g/dL (ref 12.0–15.0)
LYMPHS ABS: 2.1 10*3/uL (ref 0.7–4.0)
Lymphocytes Relative: 49.1 % — ABNORMAL HIGH (ref 12.0–46.0)
MCHC: 33.7 g/dL (ref 30.0–36.0)
MCV: 86.3 fl (ref 78.0–100.0)
MONOS PCT: 10.7 % (ref 3.0–12.0)
Monocytes Absolute: 0.5 10*3/uL (ref 0.1–1.0)
NEUTROS ABS: 1.7 10*3/uL (ref 1.4–7.7)
NEUTROS PCT: 38.1 % — AB (ref 43.0–77.0)
PLATELETS: 285 10*3/uL (ref 150.0–400.0)
RBC: 4.62 Mil/uL (ref 3.87–5.11)
RDW: 14.3 % (ref 11.5–15.5)
WBC: 4.4 10*3/uL (ref 4.0–10.5)

## 2015-11-01 LAB — LIPID PANEL
CHOLESTEROL: 193 mg/dL (ref 0–200)
HDL: 47.4 mg/dL (ref >39.00)
LDL Cholesterol: 134 mg/dL — ABNORMAL HIGH (ref 0–99)
NonHDL: 145.27
TRIGLYCERIDES: 57 mg/dL (ref 0.0–149.0)
Total CHOL/HDL Ratio: 4
VLDL: 11.4 mg/dL (ref 0.0–40.0)

## 2015-11-01 LAB — COMPREHENSIVE METABOLIC PANEL
ALK PHOS: 54 U/L (ref 39–117)
ALT: 36 U/L — AB (ref 0–35)
AST: 22 U/L (ref 0–37)
Albumin: 4.3 g/dL (ref 3.5–5.2)
BILIRUBIN TOTAL: 0.7 mg/dL (ref 0.2–1.2)
BUN: 6 mg/dL (ref 6–23)
CO2: 29 meq/L (ref 19–32)
Calcium: 9.3 mg/dL (ref 8.4–10.5)
Chloride: 100 mEq/L (ref 96–112)
Creatinine, Ser: 0.6 mg/dL (ref 0.40–1.20)
GFR: 140.76 mL/min (ref >60.00)
GLUCOSE: 142 mg/dL — AB (ref 70–99)
POTASSIUM: 3.1 meq/L — AB (ref 3.5–5.1)
SODIUM: 136 meq/L (ref 135–145)
TOTAL PROTEIN: 7.4 g/dL (ref 6.0–8.3)

## 2015-11-01 LAB — HEMOGLOBIN A1C: Hgb A1c MFr Bld: 8 % — ABNORMAL HIGH (ref 4.6–6.5)

## 2015-11-08 ENCOUNTER — Other Ambulatory Visit: Payer: Self-pay

## 2015-11-08 MED ORDER — SITAGLIP PHOS-METFORMIN HCL ER 50-1000 MG PO TB24: 2.0000 | ORAL_TABLET | Freq: Every day | ORAL | Status: DC

## 2015-11-08 MED ORDER — ATORVASTATIN CALCIUM 20 MG PO TABS: 20.0000 mg | ORAL_TABLET | Freq: Every day | ORAL | Status: DC

## 2015-11-22 ENCOUNTER — Other Ambulatory Visit: Payer: Self-pay | Admitting: Family Medicine

## 2015-11-22 NOTE — Telephone Encounter (Signed)
Refilled patients rx with #90 and 1 rf

## 2015-12-17 ENCOUNTER — Other Ambulatory Visit: Payer: Self-pay | Admitting: Family Medicine

## 2016-02-13 ENCOUNTER — Other Ambulatory Visit: Payer: Self-pay

## 2016-02-13 MED ORDER — SITAGLIP PHOS-METFORMIN HCL ER 50-1000 MG PO TB24: 2.0000 | ORAL_TABLET | Freq: Every day | ORAL | 0 refills | Status: DC

## 2016-02-13 MED ORDER — ATORVASTATIN CALCIUM 20 MG PO TABS: 20.0000 mg | ORAL_TABLET | Freq: Every day | ORAL | 0 refills | Status: DC

## 2016-03-06 ENCOUNTER — Telehealth: Payer: Self-pay | Admitting: Family Medicine

## 2016-03-06 MED ORDER — ALBUTEROL SULFATE HFA 108 (90 BASE) MCG/ACT IN AERS: 2.0000 | INHALATION_SPRAY | Freq: Four times a day (QID) | RESPIRATORY_TRACT | 5 refills | Status: DC | PRN

## 2016-03-06 MED ORDER — CITALOPRAM HYDROBROMIDE 20 MG PO TABS: 10.0000 mg | ORAL_TABLET | Freq: Every day | ORAL | 1 refills | Status: DC

## 2016-03-06 NOTE — Telephone Encounter (Signed)
°  Relation to [id number] Call back number:[phone] Pharmacy: [facility] #[id number] - , [place] [phone] (Phone) [phone] (Fax)     Reason for call:  patient requesting a refill albuterol (PROAIR HFA) 108 (90 Base) MCG/ACT inhaler and citalopram (CELEXA) 20 MG tablet

## 2016-03-06 NOTE — Telephone Encounter (Signed)
Med's faxed.    KP 

## 2016-05-15 ENCOUNTER — Other Ambulatory Visit: Payer: Self-pay | Admitting: Family Medicine

## 2016-05-21 ENCOUNTER — Other Ambulatory Visit: Payer: Self-pay | Admitting: Family Medicine

## 2016-05-21 NOTE — Telephone Encounter (Signed)
Medication filled to pharmacy as requested.   Pt is due for follow-up appt. Please reach out to her to schedule.

## 2016-05-29 NOTE — Telephone Encounter (Signed)
Patient will call back to schedule follow up appointment

## 2016-09-25 DIAGNOSIS — S0990XA Unspecified injury of head, initial encounter: Secondary | ICD-10-CM | POA: Diagnosis not present

## 2016-09-25 DIAGNOSIS — S060X0A Concussion without loss of consciousness, initial encounter: Secondary | ICD-10-CM | POA: Diagnosis not present

## 2016-09-25 DIAGNOSIS — Y9389 Activity, other specified: Secondary | ICD-10-CM | POA: Diagnosis not present

## 2016-09-25 DIAGNOSIS — W228XXA Striking against or struck by other objects, initial encounter: Secondary | ICD-10-CM | POA: Diagnosis not present

## 2016-09-30 ENCOUNTER — Ambulatory Visit (INDEPENDENT_AMBULATORY_CARE_PROVIDER_SITE_OTHER): Payer: BLUE CROSS/BLUE SHIELD | Admitting: Family Medicine

## 2016-09-30 ENCOUNTER — Encounter: Payer: Self-pay | Admitting: Family Medicine

## 2016-09-30 VITALS — BP 120/70 | HR 82 | Temp 98.9°F | Resp 16 | Ht 64.5 in | Wt 226.6 lb

## 2016-09-30 DIAGNOSIS — Z30433 Encounter for removal and reinsertion of intrauterine contraceptive device: Secondary | ICD-10-CM

## 2016-09-30 DIAGNOSIS — E119 Type 2 diabetes mellitus without complications: Secondary | ICD-10-CM | POA: Diagnosis not present

## 2016-09-30 DIAGNOSIS — S060X0D Concussion without loss of consciousness, subsequent encounter: Secondary | ICD-10-CM | POA: Diagnosis not present

## 2016-09-30 DIAGNOSIS — I10 Essential (primary) hypertension: Secondary | ICD-10-CM

## 2016-09-30 DIAGNOSIS — E785 Hyperlipidemia, unspecified: Secondary | ICD-10-CM | POA: Diagnosis not present

## 2016-09-30 MED ORDER — GLUCOSE BLOOD VI STRP: ORAL_STRIP | 1 refills | Status: AC

## 2016-09-30 MED ORDER — ATORVASTATIN CALCIUM 20 MG PO TABS: 20.0000 mg | ORAL_TABLET | Freq: Every day | ORAL | 1 refills | Status: DC

## 2016-09-30 MED ORDER — LISINOPRIL-HYDROCHLOROTHIAZIDE 10-12.5 MG PO TABS: 1.0000 | ORAL_TABLET | Freq: Every day | ORAL | 1 refills | Status: DC

## 2016-09-30 MED ORDER — SITAGLIP PHOS-METFORMIN HCL ER 50-1000 MG PO TB24
2.0000 | ORAL_TABLET | Freq: Every day | ORAL | 1 refills | Status: DC
Start: 2016-09-30 — End: 2017-03-30

## 2016-09-30 MED ORDER — ONETOUCH DELICA LANCETS FINE MISC: 1 refills | Status: DC

## 2016-09-30 MED ORDER — CITALOPRAM HYDROBROMIDE 20 MG PO TABS: 10.0000 mg | ORAL_TABLET | Freq: Every day | ORAL | 1 refills | Status: DC

## 2016-09-30 NOTE — Progress Notes (Signed)
Pre visit review using our clinic review tool, if applicable. No additional management support is needed unless otherwise documented below in the visit note. 

## 2016-09-30 NOTE — Patient Instructions (Signed)
Concussion, Adult A concussion is a brain injury from a direct hit (blow) to the head or body. This blow causes the brain to shake quickly back and forth inside the skull. This can damage brain cells and cause chemical changes in the brain. A concussion may also be known as a mild traumatic brain injury (TBI). Concussions are usually not life-threatening, but the effects of a concussion can be serious. If you have a concussion, you are more likely to experience concussion-like symptoms after a direct blow to the head in the future. What are the causes? This condition is caused by:  A direct blow to the head, such as from running into another player during a game, being hit in a fight, or hitting your head on a hard surface.  A jolt of the head or neck that causes the brain to move back and forth inside the skull, such as in a car crash.  What are the signs or symptoms? The signs of a concussion can be hard to notice. Early on, they may be missed by you, family members, and health care providers. You may look fine but act or feel differently. Symptoms are usually temporary, but they may last for days, weeks, or even longer. Some symptoms may appear right away but other symptoms may not show up for hours or days. Every head injury is different. Symptoms may include:  Headaches. This can include a feeling of pressure in the head.  Memory problems.  Trouble concentrating, organizing, or making decisions.  Slowness in thinking, acting or reacting, speaking, or reading.  Confusion.  Fatigue.  Changes in eating or sleeping patterns.  Problems with coordination or balance.  Nausea or vomiting.  Numbness or tingling.  Sensitivity to light or noise.  Vision or hearing problems.  Reduced sense of smell.  Irritability or mood changes.  Dizziness.  Lack of motivation.  Seeing or hearing things that other people do not see or hear (hallucinations).  How is this diagnosed? This  condition is diagnosed based on:  Your symptoms.  A description of your injury.  You may also have tests, including:  Imaging tests, such as a CT scan or MRI. These are done to look for signs of brain injury.  Neuropsychological tests. These measure your thinking, understanding, learning, and remembering abilities.  How is this treated? This condition is treated with physical and mental rest and careful observation, usually at home. If the concussion is severe, you may need to stay home from work for a while. You may be referred to a concussion clinic or to other health care providers for management. It is important that you tell your health care provider if:  You are taking any medicines, including prescription medicines, over-the-counter medicines, and natural remedies. Some medicines, such as blood thinners (anticoagulants) and aspirin, may increase the chance of complications, such as bleeding.  You are taking or have taken alcohol or illegal drugs. Alcohol and certain other drugs may slow your recovery and can put you at risk of further injury.  How fast you will recover from a concussion depends on many factors, such as how severe your concussion is, what part of your brain was injured, how old you are, and how healthy you were before the concussion. Recovery can take time. It is important to wait to return to activity until a health care provider says it is safe to do that and your symptoms are completely gone. Follow these instructions at home: Activity  Limit activities that   require a lot of thought or concentration. These may include: ? Doing homework or job-related work. ? Watching TV. ? Working on the computer. ? Playing memory games and puzzles.  Rest. Rest helps the brain to heal. Make sure you: ? Get plenty of sleep at night. Avoid staying up late at night. ? Keep the same bedtime hours on weekends and weekdays. ? Rest during the day. Take naps or rest breaks when you  feel tired.  Having another concussion before the first one has healed can be dangerous. Do not do high-risk activities that could cause a second concussion, such as riding a bicycle or playing sports.  Ask your health care provider when you can return to your normal activities, such as school, work, athletics, driving, riding a bicycle, or using heavy machinery. Your ability to react may be slower after a brain injury. Never do these activities if you are dizzy. Your health care provider will likely give you a plan for gradually returning to activities. General instructions  Take over-the-counter and prescription medicines only as told by your health care provider.  Do not drink alcohol until your health care provider says you can.  If it is harder than usual to remember things, write them down.  If you are easily distracted, try to do one thing at a time. For example, do not try to watch TV while fixing dinner.  Talk with family members or close friends when making important decisions.  Watch your symptoms and tell others to do the same. Complications sometimes occur after a concussion. Older adults with a brain injury may have a higher risk of serious complications, such as a blood clot in the brain.  Tell your teachers, school nurse, school counselor, coach, athletic trainer, or work manager about your injury, symptoms, and restrictions. Tell them about what you can or cannot do. They should watch for: ? Increased problems with attention or concentration. ? Increased difficulty remembering or learning new information. ? Increased time needed to complete tasks or assignments. ? Increased irritability or decreased ability to cope with stress. ? Increased symptoms.  Keep all follow-up visits as told by your health care provider. This is important. How is this prevented? It is very important to avoid another brain injury, especially as you recover. In rare cases, another injury can lead  to permanent brain damage, brain swelling, or death. The risk of this is greatest during the first 7-10 days after a head injury. Avoid injuries by:  Wearing a seat belt when riding in a car.  Wearing a helmet when biking, skiing, skateboarding, skating, or doing similar activities.  Avoiding activities that could lead to a second concussion, such as contact or recreational sports, until your health care provider says it is okay.  Taking safety measures in your home, such as: ? Removing clutter and tripping hazards from floors and stairways. ? Using grab bars in bathrooms and handrails by stairs. ? Placing non-slip mats on floors and in bathtubs. ? Improving lighting in dim areas.  Contact a health care provider if:  Your symptoms get worse.  You have new symptoms.  You continue to have symptoms for more than 2 weeks. Get help right away if:  You have severe or worsening headaches.  You have weakness or numbness in any part of your body.  Your coordination gets worse.  You vomit repeatedly.  You are sleepier.  The pupil of one eye is larger than the other.  You have convulsions or a   seizure.  Your speech is slurred.  Your fatigue, confusion, or irritability gets worse.  You cannot recognize people or places.  You have neck pain.  It is difficult to wake you up.  You have unusual behavior changes.  You lose consciousness. Summary  A concussion is a brain injury from a direct hit (blow) to the head or body.  A concussion may also be called a mild traumatic brain injury (TBI).  You may have imaging tests and neuropsychological tests to diagnose a concussion.  This condition is treated with physical and mental rest and careful observation.  Ask your health care provider when you can return to your normal activities, such as school, work, athletics, driving, riding a bicycle, or using heavy machinery. Follow safety instructions as told by your health care  provider. This information is not intended to replace advice given to you by your health care provider. Make sure you discuss any questions you have with your health care provider. Document Released: 09/20/2003 Document Revised: 06/10/2016 Document Reviewed: 06/10/2016 Elsevier Interactive Patient Education  2017 [employer]  

## 2016-09-30 NOTE — Assessment & Plan Note (Signed)
hgba1c acceptable, minimize simple carbs. Increase exercise as tolerated. Continue current meds 

## 2016-09-30 NOTE — Assessment & Plan Note (Signed)
Tolerating statin, encouraged heart healthy diet, avoid trans fats, minimize simple carbs and saturated fats. Increase exercise as tolerated 

## 2016-09-30 NOTE — Progress Notes (Addendum)
Patient ID: Jasmine Hinton, female   DOB: 03-Nov-1970, 46 y.o.   MRN: [id number]     Subjective:     Patient ID: Jasmine Hinton, female    DOB: 04-20-1971, 46 y.o.   MRN: [id number]  Chief Complaint  Patient presents with  . Hospitalization Follow-up    head injury  . Hyperlipidemia  . Diabetes  . Hypertension    HPI  Patient is in today for ER follow up for head injury.  Head still feels tender and has some nausea.  Feels better than when she was seen at hospital.   She also would like to follow up on diabetes, cholesterol, and blood pressure.  Sugar has been doing well the highest being at 135.  She feels like she is doing better on the Janumet.  Patient Care Team: Ann Held, DO as PCP - General   Past Medical History:  Diagnosis Date  . Anxiety   . Depression   . Diabetes mellitus   . Hypertension     Past Surgical History:  Procedure Laterality Date  . TONSILLECTOMY      Family History  Problem Relation Age of Onset  . Diabetes    . Hypertension    . Depression    . Colon cancer Maternal Grandfather 51    Social History   Social History  . Marital status: Legally Separated    Spouse name: N/A  . Number of children: N/A  . Years of education: N/A   Occupational History  . unemployed    Social History Main Topics  . Smoking status: Never Smoker  . Smokeless tobacco: Never Used  . Alcohol use Yes     Comment: rare  . Drug use: No  . Sexual activity: Yes    Partners: Male    Birth control/ protection: IUD     Comment: one partner currently   Other Topics Concern  . Not on file   Social History Narrative  . No narrative on file    Outpatient Medications Prior to Visit  Medication Sig Dispense Refill  . albuterol (PROAIR HFA) 108 (90 Base) MCG/ACT inhaler Inhale 2 puffs into the lungs every 6 (six) hours as needed for wheezing or shortness of breath. 1 Inhaler 5  . clonazePAM (KLONOPIN) 1 MG tablet 1 po tid prn 30 tablet 1  .  fluticasone (FLONASE) 50 MCG/ACT nasal spray Place 2 sprays into both nostrils daily. 16 g 5  . levonorgestrel (MIRENA) 20 MCG/24HR IUD 1 each by Intrauterine route once.      Marland Kitchen atorvastatin (LIPITOR) 20 MG tablet Take 1 tablet (20 mg total) by mouth daily. 30 tablet 0  . citalopram (CELEXA) 20 MG tablet Take 0.5 tablets (10 mg total) by mouth daily. 90 tablet 1  . glucose blood (ONE TOUCH ULTRA TEST) test strip TEST BLOOD SUGAR 2 TIMES A DAY. 100 each 3  . JANUMET XR 50-1000 MG TB24 TAKE 2 TABLETS BY MOUTH DAILY. 180 tablet 0  . Lancets (FREESTYLE) lancets 1 each by Other route as needed. Use as instructed     . lisinopril-hydrochlorothiazide (PRINZIDE,ZESTORETIC) 10-12.5 MG tablet TAKE 1 TABLET BY MOUTH DAILY 90 tablet 1  . ONETOUCH DELICA LANCETS MISC 1 tablet by Does not apply route.       No facility-administered medications prior to visit.     No Known Allergies  Review of Systems  Constitutional: Negative for fever and malaise/fatigue.  HENT: Negative for congestion.   Eyes: Negative  for blurred vision.  Respiratory: Negative for cough and shortness of breath.   Cardiovascular: Negative for chest pain, palpitations and leg swelling.  Gastrointestinal: Positive for nausea. Negative for vomiting.  Musculoskeletal: Negative for back pain.  Skin: Negative for rash.  Neurological: Positive for headaches. Negative for loss of consciousness.       Objective:    Physical Exam  Constitutional: She is oriented to person, place, and time. She appears well-developed and well-nourished. No distress.  HENT:  Head: Normocephalic and atraumatic.  Eyes: Conjunctivae are normal.  Neck: Normal range of motion. No thyromegaly present.  Cardiovascular: Normal rate and regular rhythm.   Pulmonary/Chest: Effort normal and breath sounds normal. She has no wheezes.  Abdominal: Soft. Bowel sounds are normal. There is no tenderness.  Musculoskeletal: Normal range of motion. She exhibits no edema  or deformity.  Neurological: She is alert and oriented to person, place, and time.  Skin: Skin is warm and dry. She is not diaphoretic.  Psychiatric: She has a normal mood and affect.    BP 120/70 (BP Location: Left Arm, Cuff Size: Normal)   Pulse 82   Temp 98.9 F (37.2 C) (Oral)   Resp 16   Ht 5' 4.5" (1.638 m)   Wt 226 lb 9.6 oz (102.8 kg)   SpO2 98%   BMI 38.30 kg/m  Wt Readings from Last 3 Encounters:  09/30/16 226 lb 9.6 oz (102.8 kg)  10/18/15 218 lb 12.8 oz (99.2 kg)  11/28/14 212 lb 12.8 oz (96.5 kg)   BP Readings from Last 3 Encounters:  09/30/16 120/70  10/18/15 106/73  11/28/14 100/62     Immunization History  Administered Date(s) Administered  . Influenza Whole 04/05/2010  . Pneumococcal Polysaccharide-23 07/21/2014  . Td 09/08/2008    Health Maintenance  Topic Date Due  . HEMOGLOBIN A1C  05/02/2016  . INFLUENZA VACCINE  02/11/2017  . PAP SMEAR  07/21/2017  . OPHTHALMOLOGY EXAM  09/04/2017  . FOOT EXAM  09/30/2017  . TETANUS/TDAP  09/08/2018  . PNEUMOCOCCAL POLYSACCHARIDE VACCINE (2) 07/22/2019  . HIV Screening  Completed    Lab Results  Component Value Date   WBC 4.4 11/01/2015   HGB 13.4 11/01/2015   HCT 39.9 11/01/2015   PLT 285.0 11/01/2015   GLUCOSE 142 (H) 11/01/2015   CHOL 193 11/01/2015   TRIG 57.0 11/01/2015   HDL 47.40 11/01/2015   LDLCALC 134 (H) 11/01/2015   ALT 36 (H) 11/01/2015   AST 22 11/01/2015   NA 136 11/01/2015   K 3.1 (L) 11/01/2015   CL 100 11/01/2015   CREATININE 0.60 11/01/2015   BUN 6 11/01/2015   CO2 29 11/01/2015   TSH 1.39 10/22/2012   HGBA1C 8.0 (H) 11/01/2015   MICROALBUR 1.4 10/22/2012    Lab Results  Component Value Date   TSH 1.39 10/22/2012   Lab Results  Component Value Date   WBC 4.4 11/01/2015   HGB 13.4 11/01/2015   HCT 39.9 11/01/2015   MCV 86.3 11/01/2015   PLT 285.0 11/01/2015   Lab Results  Component Value Date   NA 136 11/01/2015   K 3.1 (L) 11/01/2015   CO2 29 11/01/2015    GLUCOSE 142 (H) 11/01/2015   BUN 6 11/01/2015   CREATININE 0.60 11/01/2015   BILITOT 0.7 11/01/2015   ALKPHOS 54 11/01/2015   AST 22 11/01/2015   ALT 36 (H) 11/01/2015   PROT 7.4 11/01/2015   ALBUMIN 4.3 11/01/2015   CALCIUM 9.3 11/01/2015  GFR 140.76 11/01/2015   Lab Results  Component Value Date   CHOL 193 11/01/2015   Lab Results  Component Value Date   HDL 47.40 11/01/2015   Lab Results  Component Value Date   LDLCALC 134 (H) 11/01/2015   Lab Results  Component Value Date   TRIG 57.0 11/01/2015   Lab Results  Component Value Date   CHOLHDL 4 11/01/2015   Lab Results  Component Value Date   HGBA1C 8.0 (H) 11/01/2015         Assessment & Plan:   Problem List Items Addressed This Visit      Unprioritized   Essential hypertension    Well controlled, no changes to meds. Encouraged heart healthy diet such as the DASH diet and exercise as tolerated.        Relevant Medications   lisinopril-hydrochlorothiazide (PRINZIDE,ZESTORETIC) 10-12.5 MG tablet   atorvastatin (LIPITOR) 20 MG tablet   Other Relevant Orders   Comprehensive metabolic panel   Concussion with no loss of consciousness    Pt symptoms improving  Er visit reviewed       Other Visit Diagnoses    Hyperlipidemia, unspecified hyperlipidemia type    -  Primary   Relevant Medications   lisinopril-hydrochlorothiazide (PRINZIDE,ZESTORETIC) 10-12.5 MG tablet   atorvastatin (LIPITOR) 20 MG tablet   Other Relevant Orders   Lipid panel   Controlled type 2 diabetes mellitus without complication, without long-term current use of insulin (HCC)       Relevant Medications   SitaGLIPtin-MetFORMIN HCl (JANUMET XR) 50-1000 MG TB24   lisinopril-hydrochlorothiazide (PRINZIDE,ZESTORETIC) 10-12.5 MG tablet   atorvastatin (LIPITOR) 20 MG tablet   Other Relevant Orders   Hemoglobin A1c   Encounter for removal and reinsertion of intrauterine contraceptive device (IUD)       Relevant Orders   Ambulatory  referral to Obstetrics / Gynecology      I have discontinued Ms. Tang's freestyle, ONETOUCH DELICA LANCETS, and glucose blood. I have also changed her JANUMET XR to SitaGLIPtin-MetFORMIN HCl. Additionally, I am having her start on glucose blood and ONETOUCH DELICA LANCETS FINE. Lastly, I am having her maintain her levonorgestrel, fluticasone, clonazePAM, albuterol, lisinopril-hydrochlorothiazide, atorvastatin, and citalopram.  Meds ordered this encounter  Medications  . ondansetron (ZOFRAN) 4 MG tablet    Sig: Take 4 mg by mouth.  . SitaGLIPtin-MetFORMIN HCl (JANUMET XR) 50-1000 MG TB24    Sig: Take 2 tablets by mouth daily.    Dispense:  180 tablet    Refill:  1  . lisinopril-hydrochlorothiazide (PRINZIDE,ZESTORETIC) 10-12.5 MG tablet    Sig: Take 1 tablet by mouth daily.    Dispense:  90 tablet    Refill:  1  . atorvastatin (LIPITOR) 20 MG tablet    Sig: Take 1 tablet (20 mg total) by mouth daily.    Dispense:  90 tablet    Refill:  1  . citalopram (CELEXA) 20 MG tablet    Sig: Take 0.5 tablets (10 mg total) by mouth daily.    Dispense:  90 tablet    Refill:  1  . glucose blood (ONETOUCH VERIO) test strip    Sig: Use as instructed twice a day.  E11.9    Dispense:  100 each    Refill:  1  . ONETOUCH DELICA LANCETS FINE MISC    Sig: Use as instructed twice a day.  E11.9    Dispense:  100 each    Refill:  1    CMA served as Education administrator during this  visit. History, Physical and Plan performed by medical provider. Documentation and orders reviewed and attested to.  Ann Held, DO

## 2016-09-30 NOTE — Assessment & Plan Note (Signed)
Well controlled, no changes to meds. Encouraged heart healthy diet such as the DASH diet and exercise as tolerated.  °

## 2016-10-15 ENCOUNTER — Telehealth: Payer: Self-pay | Admitting: Family Medicine

## 2016-10-15 NOTE — Telephone Encounter (Signed)
Caller name: Kenney Houseman from [facility] from [employer] back number:  [phone]    Reason for call:  Inquiring if dx code is head injury, please advise

## 2016-10-21 DIAGNOSIS — S060X0A Concussion without loss of consciousness, initial encounter: Secondary | ICD-10-CM | POA: Insufficient documentation

## 2016-10-21 NOTE — Assessment & Plan Note (Signed)
Pt symptoms improving  Er visit reviewed

## 2016-10-21 NOTE — Telephone Encounter (Signed)
Jasmine Hinton and they did not need diagnosis, but needed office notes from the 09/30/16 visit. So copied /faxed to Attn: Tonya at [phone]

## 2016-10-21 NOTE — Telephone Encounter (Signed)
Pt was seen in er for concussion/ head injury--- not sure what they are asking

## 2016-11-13 ENCOUNTER — Other Ambulatory Visit (INDEPENDENT_AMBULATORY_CARE_PROVIDER_SITE_OTHER): Payer: BLUE CROSS/BLUE SHIELD

## 2016-11-13 DIAGNOSIS — I10 Essential (primary) hypertension: Secondary | ICD-10-CM | POA: Diagnosis not present

## 2016-11-13 DIAGNOSIS — E119 Type 2 diabetes mellitus without complications: Secondary | ICD-10-CM

## 2016-11-13 DIAGNOSIS — E785 Hyperlipidemia, unspecified: Secondary | ICD-10-CM | POA: Diagnosis not present

## 2016-11-13 LAB — COMPREHENSIVE METABOLIC PANEL
ALBUMIN: 4.2 g/dL (ref 3.5–5.2)
ALT: 40 U/L — ABNORMAL HIGH (ref 0–35)
AST: 23 U/L (ref 0–37)
Alkaline Phosphatase: 66 U/L (ref 39–117)
BUN: 6 mg/dL (ref 6–23)
CALCIUM: 9.4 mg/dL (ref 8.4–10.5)
CHLORIDE: 102 meq/L (ref 96–112)
CO2: 27 meq/L (ref 19–32)
Creatinine, Ser: 0.55 mg/dL (ref 0.40–1.20)
GFR: 154.86 mL/min (ref >60.00)
Glucose, Bld: 148 mg/dL — ABNORMAL HIGH (ref 70–99)
POTASSIUM: 3.4 meq/L — AB (ref 3.5–5.1)
SODIUM: 137 meq/L (ref 135–145)
Total Bilirubin: 0.5 mg/dL (ref 0.2–1.2)
Total Protein: 7.2 g/dL (ref 6.0–8.3)

## 2016-11-13 LAB — LIPID PANEL
CHOLESTEROL: 114 mg/dL (ref 0–200)
HDL: 38.9 mg/dL — ABNORMAL LOW (ref >39.00)
LDL CALC: 66 mg/dL (ref 0–99)
NonHDL: 75.02
TRIGLYCERIDES: 45 mg/dL (ref 0.0–149.0)
Total CHOL/HDL Ratio: 3
VLDL: 9 mg/dL (ref 0.0–40.0)

## 2016-11-13 LAB — HEMOGLOBIN A1C: Hgb A1c MFr Bld: 7.4 % — ABNORMAL HIGH (ref 4.6–6.5)

## 2016-11-16 ENCOUNTER — Encounter: Payer: Self-pay | Admitting: Family Medicine

## 2016-11-17 ENCOUNTER — Other Ambulatory Visit: Payer: Self-pay | Admitting: Family Medicine

## 2016-11-17 DIAGNOSIS — E119 Type 2 diabetes mellitus without complications: Secondary | ICD-10-CM

## 2016-11-17 DIAGNOSIS — E785 Hyperlipidemia, unspecified: Secondary | ICD-10-CM

## 2016-11-17 NOTE — Telephone Encounter (Signed)
We could change meds all together and go with metformin and victoza Or insulin  Or refer to endo

## 2016-11-20 ENCOUNTER — Other Ambulatory Visit: Payer: Self-pay | Admitting: Family Medicine

## 2016-11-20 MED ORDER — NATEGLINIDE 120 MG PO TABS: 120.0000 mg | ORAL_TABLET | Freq: Three times a day (TID) | ORAL | 1 refills | Status: DC

## 2017-01-26 ENCOUNTER — Other Ambulatory Visit: Payer: Self-pay | Admitting: Family Medicine

## 2017-01-26 MED ORDER — NATEGLINIDE 120 MG PO TABS: 120.0000 mg | ORAL_TABLET | Freq: Three times a day (TID) | ORAL | 1 refills | Status: DC

## 2017-01-30 ENCOUNTER — Other Ambulatory Visit: Payer: Self-pay | Admitting: Family Medicine

## 2017-01-30 MED ORDER — NATEGLINIDE 120 MG PO TABS: 120.0000 mg | ORAL_TABLET | Freq: Three times a day (TID) | ORAL | 0 refills | Status: DC

## 2017-01-30 NOTE — Addendum Note (Signed)
Addended by: Sharon Seller B on: 01/30/2017 10:41 AM   Modules accepted: Orders

## 2017-01-30 NOTE — Telephone Encounter (Signed)
[place] called in. He states that Rx has to be for a 90 day supply. He said that he keep receiving for only a 30 day. He said that; that is incorrect.

## 2017-01-30 NOTE — Telephone Encounter (Signed)
90 day sent in.

## 2017-03-30 ENCOUNTER — Other Ambulatory Visit: Payer: Self-pay | Admitting: Family Medicine

## 2017-04-02 ENCOUNTER — Encounter: Payer: BLUE CROSS/BLUE SHIELD | Admitting: Family Medicine

## 2017-04-20 ENCOUNTER — Other Ambulatory Visit: Payer: Self-pay | Admitting: Family Medicine

## 2017-04-20 NOTE — Telephone Encounter (Signed)
On 9.17.18 90d was faxed/thx dmf

## 2017-04-24 ENCOUNTER — Other Ambulatory Visit: Payer: Self-pay

## 2017-04-24 MED ORDER — NATEGLINIDE 120 MG PO TABS: 120.0000 mg | ORAL_TABLET | Freq: Three times a day (TID) | ORAL | 0 refills | Status: DC

## 2017-04-24 NOTE — Telephone Encounter (Signed)
Rx request for 90 day supply. Please advise.

## 2017-07-16 ENCOUNTER — Other Ambulatory Visit: Payer: Self-pay | Admitting: Family Medicine

## 2017-07-23 ENCOUNTER — Encounter: Payer: BLUE CROSS/BLUE SHIELD | Admitting: Family Medicine

## 2017-10-21 ENCOUNTER — Other Ambulatory Visit: Payer: Self-pay | Admitting: Family Medicine

## 2017-10-23 ENCOUNTER — Other Ambulatory Visit: Payer: Self-pay | Admitting: Family Medicine

## 2017-11-05 LAB — HM DIABETES EYE EXAM

## 2018-01-21 ENCOUNTER — Other Ambulatory Visit: Payer: Self-pay | Admitting: Family Medicine

## 2018-01-22 ENCOUNTER — Other Ambulatory Visit: Payer: Self-pay | Admitting: Family Medicine

## 2018-02-21 ENCOUNTER — Other Ambulatory Visit: Payer: Self-pay | Admitting: Family Medicine

## 2018-03-01 ENCOUNTER — Telehealth: Payer: Self-pay

## 2018-03-01 ENCOUNTER — Other Ambulatory Visit: Payer: Self-pay | Admitting: Family Medicine

## 2018-03-01 MED ORDER — LISINOPRIL-HYDROCHLOROTHIAZIDE 10-12.5 MG PO TABS: 1.0000 | ORAL_TABLET | Freq: Every day | ORAL | 0 refills | Status: DC

## 2018-03-01 NOTE — Telephone Encounter (Signed)
Author received refill request for lisinopril-HCTZ, but limited supply given 8/12, as pt. needs OV prior to additional refills per note from Dr. Etter Sjogren. Last OV 09/2016 Author phoned pt. To make aware. Author left detailed VM to call to make an OV appointment [phone].

## 2018-03-10 ENCOUNTER — Other Ambulatory Visit: Payer: Self-pay | Admitting: Family Medicine

## 2018-04-01 ENCOUNTER — Other Ambulatory Visit: Payer: Self-pay | Admitting: Family Medicine

## 2018-04-02 NOTE — Telephone Encounter (Signed)
Author phoned pt. Re: need for OV prior to additional refills of prinzide. No answer, author left detailed VM asking for call back to schedule OV [phone]. PEC OK to handle.

## 2018-04-18 ENCOUNTER — Other Ambulatory Visit: Payer: Self-pay | Admitting: Family Medicine

## 2018-04-21 ENCOUNTER — Other Ambulatory Visit: Payer: Self-pay | Admitting: Family Medicine

## 2018-04-28 ENCOUNTER — Telehealth: Payer: Self-pay

## 2018-04-28 NOTE — Telephone Encounter (Signed)
PA initiated via Covermymeds; KEY: [id number]. PA approved.

## 2018-04-29 NOTE — Telephone Encounter (Signed)
Effective 04/28/2018 to 04/27/2021.

## 2018-05-02 ENCOUNTER — Other Ambulatory Visit: Payer: Self-pay | Admitting: Family Medicine

## 2018-06-03 ENCOUNTER — Ambulatory Visit: Payer: Self-pay | Admitting: *Deleted

## 2018-06-03 ENCOUNTER — Other Ambulatory Visit: Payer: Self-pay | Admitting: Family Medicine

## 2018-06-03 ENCOUNTER — Telehealth: Payer: Self-pay | Admitting: *Deleted

## 2018-06-03 DIAGNOSIS — F418 Other specified anxiety disorders: Secondary | ICD-10-CM

## 2018-06-03 DIAGNOSIS — I10 Essential (primary) hypertension: Secondary | ICD-10-CM

## 2018-06-03 MED ORDER — CLONAZEPAM 1 MG PO TABS: ORAL_TABLET | ORAL | 1 refills | Status: DC

## 2018-06-03 NOTE — Telephone Encounter (Signed)
Pt called to request refill of Klonopin; sent for triage when pt mentioned anxiety with recent event. Pt reports her partner told her in Tuesday he had been cheating on her for 1 year. States they had just bought a house together. States she had to take 2 days off work. Denies any suicidal or homicidal ideation. Does report mild chest tightness, tingling of hands at times with increased anxiety; "On edge." States has supportive friends and family.States was seeing a therapist whom has recently retired. Would like referral to another therapist.  Pt has appt already established with Dr. Carollee Herter for 06/15/18. Pt is requesting partial refill of Klonopin up to appt date.  LRF of Klonopin 11/28/14. Care advise given per protocol. If appropriate:  [facility] [address] Please advise: [phone]  Reason for Disposition . Recent traumatic event (e.g., death of a loved one, job loss, victim/witness of crime)  Answer Assessment - Initial Assessment Questions 1. CONCERN: "What happened that made you call today?"     Found out partner was "Cheating" 2. ANXIETY SYMPTOM SCREENING: "Can you describe how you have been feeling?"  (e.g., tense, restless, panicky, anxious, keyed up, trouble sleeping, trouble concentrating)     Not sleeping, unable to work x 2 days, "On edge" 3. ONSET: "How long have you been feeling this way?"     Tuesday 4. RECURRENT: "Have you felt this way before?"  If yes: "What happened that time?" "What helped these feelings go away in the past?"      Had therapist who retired 74. RISK OF HARM - SUICIDAL IDEATION:  "Do you ever have thoughts of hurting or killing yourself?"  (e.g., yes, no, no but preoccupation with thoughts about death)   - INTENT:  "Do you have thoughts of hurting or killing yourself right NOW?" (e.g., yes, no, N/A)   - PLAN: "Do you have a specific plan for how you would do this?" (e.g., gun, knife, overdose, no plan, N/A)     no 6. RISK OF HARM - HOMICIDAL IDEATION:   "Do you ever have thoughts of hurting or killing someone else?"  (e.g., yes, no, no but preoccupation with thoughts about death)   - INTENT:  "Do you have thoughts of hurting or killing someone right NOW?" (e.g., yes, no, N/A)   - PLAN: "Do you have a specific plan for how you would do this?" (e.g., gun, knife, no plan, N/A)  no     7. FUNCTIONAL IMPAIRMENT: "How have things been going for you overall in your life? Have you had any more difficulties than usual doing your normal daily activities?"  (e.g., better, same, worse; self-care, school, work, interactions)    Off work x2 days 8. SUPPORT: "Who is with you now?" "Who do you live with?" "Do you have family or friends nearby who you can talk to?"      Supportive friends, family 33. THERAPIST: "Do you have a counselor or therapist? Name?"     Not presently 61. STRESSORS: "Has there been any new stress or recent changes in your life?"       Yes as above 11. CAFFEINE ABUSE: "Do you drink caffeinated beverages, and how much each day?" (e.g., coffee, tea, colas)       no 12. SUBSTANCE ABUSE: "Do you use any illegal drugs or alcohol?"       No, avoiding alcohol 13. OTHER SYMPTOMS: "Do you have any other physical symptoms right now?" (e.g., chest pain, palpitations, difficulty breathing, fever)  Chest tightness before panic attack, tingling hands and arms  Protocols used: ANXIETY AND PANIC ATTACK-A-AH

## 2018-06-03 NOTE — Telephone Encounter (Signed)
Does she want to come here--- she can see Terri

## 2018-06-03 NOTE — Telephone Encounter (Signed)
Requested medication (s) are due for refill today: RX expired  Requested medication (s) are on the active medication list: yes    Last refill: 10/18/15  Future visit scheduled yes 06/15/18 Dr. Carollee Herter  Notes to clinic:Please see triage from today  Requested Prescriptions  Pending Prescriptions Disp Refills   clonazePAM (KLONOPIN) 1 MG tablet 30 tablet 1    Sig: 1 po tid prn     Not Delegated - Psychiatry:  Anxiolytics/Hypnotics Failed - 06/03/2018 10:30 AM      Failed - This refill cannot be delegated      Failed - Urine Drug Screen completed in last 360 days.      Failed - Valid encounter within last 6 months    Recent Outpatient Visits          1 year ago Hyperlipidemia, unspecified hyperlipidemia type   [facility] at [place], DO   2 years ago Essential hypertension   [facility] at [place], [place]   3 years ago Acute frontal sinusitis, recurrence not specified   [facility] at [place], DO   3 years ago Diabetes mellitus type II, uncontrolled   [facility] at [place], DO      Future Appointments            In 1 week [place], [place] at [employer] [facility], [facility]

## 2018-06-03 NOTE — Telephone Encounter (Signed)
Copied from [place] [id number]. Topic: Quick Communication - See Telephone Encounter >> Jun 03, 2018  9:06 AM Hewitt Shorts wrote: Pt is calling to see if Dr. Etter Sjogren will refill her klonopin-pt states her counselor has retired and will need to get a refill thru Dr. Etter Sjogren  Best number [phone]  [facility] [address]

## 2018-06-03 NOTE — Telephone Encounter (Signed)
Pt states she does want to come to med center to see someone, OK to refer to Terri if you are OK with that. Pt would like to see her asap.  Pt states she is in the middle of an acute situation. Also there is a separate phone note with a refill request for her klonopin I have let the pt speak with a triage nurse

## 2018-06-03 NOTE — Telephone Encounter (Signed)
Pt needs ov sooner if possible She can establish with terri for counseling

## 2018-06-03 NOTE — Telephone Encounter (Signed)
Copied from [place] [phone]. Topic: Quick Communication - See Telephone Encounter >> Jun 03, 2018  9:14 AM Hewitt Shorts wrote: Pt states that her counselor is retiring and would like for Dr. Etter Sjogren to recommend a new one  Best number [phone]

## 2018-06-03 NOTE — Telephone Encounter (Signed)
Pt states she has appt on 12/03, and cannot take off work until them to come in for appt.  Pt not seen since 09/2016, and advised she would need to be seen for refill. Pt asking for just enough to get to the 12/03 appt  clonazePAM (KLONOPIN) 1 MG tablet  Pt states 1-2 a day if possible.  [id number] [place], [place] [phone] (Phone) [phone] (Fax)

## 2018-06-04 NOTE — Telephone Encounter (Signed)
If it is that bad --- there is a crisis team at the ER at [place] may not have something quickly

## 2018-06-09 NOTE — Telephone Encounter (Signed)
Author phoned pt, busy signal. Chief Strategy Officer then phoned Hetty Ely on DPR, no answer. Author sent [employer] to pt. providing terri bauert's contact info and relaying Dr. Nonda Lou message.

## 2018-06-15 ENCOUNTER — Other Ambulatory Visit (HOSPITAL_COMMUNITY)
Admission: RE | Admit: 2018-06-15 | Discharge: 2018-06-15 | Disposition: A | Discharge status: Home / Self Care | Payer: BLUE CROSS/BLUE SHIELD | Source: Ambulatory Visit | Attending: Family Medicine | Admitting: Family Medicine

## 2018-06-15 ENCOUNTER — Ambulatory Visit (INDEPENDENT_AMBULATORY_CARE_PROVIDER_SITE_OTHER): Payer: BLUE CROSS/BLUE SHIELD | Admitting: Family Medicine

## 2018-06-15 ENCOUNTER — Encounter: Payer: Self-pay | Admitting: Family Medicine

## 2018-06-15 VITALS — BP 126/67 | HR 90 | Temp 98.8°F | Resp 16 | Ht 65.0 in | Wt 225.4 lb

## 2018-06-15 DIAGNOSIS — Z0000 Encounter for general adult medical examination without abnormal findings: Secondary | ICD-10-CM

## 2018-06-15 DIAGNOSIS — F418 Other specified anxiety disorders: Secondary | ICD-10-CM

## 2018-06-15 DIAGNOSIS — E785 Hyperlipidemia, unspecified: Secondary | ICD-10-CM | POA: Diagnosis not present

## 2018-06-15 DIAGNOSIS — Z7251 High risk heterosexual behavior: Secondary | ICD-10-CM | POA: Insufficient documentation

## 2018-06-15 DIAGNOSIS — Z30433 Encounter for removal and reinsertion of intrauterine contraceptive device: Secondary | ICD-10-CM

## 2018-06-15 DIAGNOSIS — I10 Essential (primary) hypertension: Secondary | ICD-10-CM | POA: Diagnosis not present

## 2018-06-15 DIAGNOSIS — J4520 Mild intermittent asthma, uncomplicated: Secondary | ICD-10-CM

## 2018-06-15 MED ORDER — CITALOPRAM HYDROBROMIDE 20 MG PO TABS: 20.0000 mg | ORAL_TABLET | Freq: Every day | ORAL | 3 refills | Status: DC

## 2018-06-15 MED ORDER — MONTELUKAST SODIUM 10 MG PO TABS: 10.0000 mg | ORAL_TABLET | Freq: Every day | ORAL | 3 refills | Status: DC

## 2018-06-15 MED ORDER — CLONAZEPAM 1 MG PO TABS: ORAL_TABLET | ORAL | 1 refills | Status: DC

## 2018-06-15 NOTE — Patient Instructions (Signed)
Preventive Care 40-64 Years, Female Preventive care refers to lifestyle choices and visits with your health care provider that can promote health and wellness. What does preventive care include?  A yearly physical exam. This is also called an annual well check.  Dental exams once or twice a year.  Routine eye exams. Ask your health care provider how often you should have your eyes checked.  Personal lifestyle choices, including: ? Daily care of your teeth and gums. ? Regular physical activity. ? Eating a healthy diet. ? Avoiding tobacco and drug use. ? Limiting alcohol use. ? Practicing safe sex. ? Taking low-dose aspirin daily starting at age 58. ? Taking vitamin and mineral supplements as recommended by your health care provider. What happens during an annual well check? The services and screenings done by your health care provider during your annual well check will depend on your age, overall health, lifestyle risk factors, and family history of disease. Counseling Your health care provider may ask you questions about your:  Alcohol use.  Tobacco use.  Drug use.  Emotional well-being.  Home and relationship well-being.  Sexual activity.  Eating habits.  Work and work Statistician.  Method of birth control.  Menstrual cycle.  Pregnancy history.  Screening You may have the following tests or measurements:  Height, weight, and BMI.  Blood pressure.  Lipid and cholesterol levels. These may be checked every 5 years, or more frequently if you are over 81 years old.  Skin check.  Lung cancer screening. You may have this screening every year starting at age 78 if you have a 30-pack-year history of smoking and currently smoke or have quit within the past 15 years.  Fecal occult blood test (FOBT) of the stool. You may have this test every year starting at age 65.  Flexible sigmoidoscopy or colonoscopy. You may have a sigmoidoscopy every 5 years or a colonoscopy  every 10 years starting at age 30.  Hepatitis C blood test.  Hepatitis B blood test.  Sexually transmitted disease (STD) testing.  Diabetes screening. This is done by checking your blood sugar (glucose) after you have not eaten for a while (fasting). You may have this done every 1-3 years.  Mammogram. This may be done every 1-2 years. Talk to your health care provider about when you should start having regular mammograms. This may depend on whether you have a family history of breast cancer.  BRCA-related cancer screening. This may be done if you have a family history of breast, ovarian, tubal, or peritoneal cancers.  Pelvic exam and Pap test. This may be done every 3 years starting at age 80. Starting at age 36, this may be done every 5 years if you have a Pap test in combination with an HPV test.  Bone density scan. This is done to screen for osteoporosis. You may have this scan if you are at high risk for osteoporosis.  Discuss your test results, treatment options, and if necessary, the need for more tests with your health care provider. Vaccines Your health care provider may recommend certain vaccines, such as:  Influenza vaccine. This is recommended every year.  Tetanus, diphtheria, and acellular pertussis (Tdap, Td) vaccine. You may need a Td booster every 10 years.  Varicella vaccine. You may need this if you have not been vaccinated.  Zoster vaccine. You may need this after age 5.  Measles, mumps, and rubella (MMR) vaccine. You may need at least one dose of MMR if you were born in  1957 or later. You may also need a second dose.  Pneumococcal 13-valent conjugate (PCV13) vaccine. You may need this if you have certain conditions and were not previously vaccinated.  Pneumococcal polysaccharide (PPSV23) vaccine. You may need one or two doses if you smoke cigarettes or if you have certain conditions.  Meningococcal vaccine. You may need this if you have certain  conditions.  Hepatitis A vaccine. You may need this if you have certain conditions or if you travel or work in places where you may be exposed to hepatitis A.  Hepatitis B vaccine. You may need this if you have certain conditions or if you travel or work in places where you may be exposed to hepatitis B.  Haemophilus influenzae type b (Hib) vaccine. You may need this if you have certain conditions.  Talk to your health care provider about which screenings and vaccines you need and how often you need them. This information is not intended to replace advice given to you by your health care provider. Make sure you discuss any questions you have with your health care provider. Document Released: 07/27/2015 Document Revised: 03/19/2016 Document Reviewed: 05/01/2015 Elsevier Interactive Patient Education  [employer].

## 2018-06-15 NOTE — Progress Notes (Signed)
Subjective:     Jasmine Hinton is a 47 y.o. female and is here for a comprehensive physical exam. The patient reports problems - pt is under stress and her counselor retired.  she needs a new one.Marland Kitchen  HYPERTENSION   Blood pressure range-not checking   Chest pain- no      Dyspnea- no Lightheadedness- no   Edema- no  Other side effects - nogood   Medication compliance: good Low salt diet- yes    DIABETES    Blood Sugar ranges-good per pt  Polyuria- no New Visual problems- no  Hypoglycemic symptoms- no  Other side effects-no Medication compliance - good Last eye exam- few months ago Foot exam- today   HYPERLIPIDEMIA  Medication compliance- good RUQ pain- no  Muscle aches- no Other side effects-no    Social History   Socioeconomic History  . Marital status: Divorced    Spouse name: Not on file  . Number of children: Not on file  . Years of education: Not on file  . Highest education level: Not on file  Occupational History  . Occupation: [employer]---[employer]--[employer]  Social Needs  . Financial resource strain: Not on file  . Food insecurity:    Worry: Not on file    Inability: Not on file  . Transportation needs:    Medical: Not on file    Non-medical: Not on file  Tobacco Use  . Smoking status: Never Smoker  . Smokeless tobacco: Never Used  Substance and Sexual Activity  . Alcohol use: Yes    Comment: rare  . Drug use: No  . Sexual activity: Yes    Partners: Male    Birth control/protection: IUD    Comment: one partner currently  Lifestyle  . Physical activity:    Days per week: Not on file    Minutes per session: Not on file  . Stress: Not on file  Relationships  . Social connections:    Talks on phone: Not on file    Gets together: Not on file    Attends religious service: Not on file    Active member of club or organization: Not on file    Attends meetings of clubs or organizations: Not on file    Relationship status: Not on file  . Intimate partner  violence:    Fear of current or ex partner: Not on file    Emotionally abused: Not on file    Physically abused: Not on file    Forced sexual activity: Not on file  Other Topics Concern  . Not on file  Social History Narrative  . Not on file   Health Maintenance  Topic Date Due  . HEMOGLOBIN A1C  05/16/2017  . PAP SMEAR  07/21/2017  . OPHTHALMOLOGY EXAM  09/04/2017  . INFLUENZA VACCINE  02/11/2018  . TETANUS/TDAP  09/08/2018  . FOOT EXAM  06/16/2019  . PNEUMOCOCCAL POLYSACCHARIDE VACCINE AGE 59-64 HIGH RISK  Completed  . HIV Screening  Completed    The following portions of the patient's history were reviewed and updated as appropriate: She  has a past medical history of Anxiety, Depression, Diabetes mellitus, and Hypertension. She does not have any pertinent problems on file. She  has a past surgical history that includes Tonsillectomy. Her family history includes Colon cancer (age of onset: 76) in her maternal grandfather; Depression in her unknown relative; Diabetes in her unknown relative; Hypertension in her unknown relative. She  reports that she has never smoked. She has never  used smokeless tobacco. She reports that she drinks alcohol. She reports that she does not use drugs. She has a current medication list which includes the following prescription(s): albuterol, atorvastatin, atorvastatin, citalopram, clonazepam, fluticasone, glucose blood, janumet xr, levonorgestrel, lisinopril-hydrochlorothiazide, nateglinide, onetouch delica lancets fine, and montelukast. Current Outpatient Medications on File Prior to Visit  Medication Sig Dispense Refill  . albuterol (PROAIR HFA) 108 (90 Base) MCG/ACT inhaler Inhale 2 puffs into the lungs every 6 (six) hours as needed for wheezing or shortness of breath. 1 Inhaler 5  . atorvastatin (LIPITOR) 20 MG tablet TAKE 1 TABLET BY MOUTH EVERY DAY 90 tablet 0  . atorvastatin (LIPITOR) 20 MG tablet TAKE 1 TABLET BY MOUTH EVERY DAY 90 tablet 0  .  fluticasone (FLONASE) 50 MCG/ACT nasal spray Place 2 sprays into both nostrils daily. 16 g 5  . glucose blood (ONETOUCH VERIO) test strip Use as instructed twice a day.  E11.9 100 each 1  . JANUMET XR 50-1000 MG TB24 TAKE 2 TABLETS BY MOUTH DAILY. 180 tablet 0  . levonorgestrel (MIRENA) 20 MCG/24HR IUD 1 each by Intrauterine route once.      Marland Kitchen lisinopril-hydrochlorothiazide (PRINZIDE,ZESTORETIC) 10-12.5 MG tablet TAKE 1 TABLET BY MOUTH DAILY. NEEDS OV BEFORE ANYMORE REFILLS 90 tablet 0  . nateglinide (STARLIX) 120 MG tablet Take 1 tablet (120 mg total) by mouth 3 (three) times daily with meals. 270 tablet 0  . ONETOUCH DELICA LANCETS FINE MISC Use as instructed twice a day.  E11.9 100 each 1   No current facility-administered medications on file prior to visit.    She has No Known Allergies..  Review of Systems Review of Systems  Constitutional: Negative for activity change, appetite change and fatigue.  HENT: Negative for hearing loss, congestion, tinnitus and ear discharge.  dentist q29m Eyes: Negative for visual disturbance (see optho q1y -- vision corrected to 20/20 with glasses).  Respiratory: Negative for cough, chest tightness and shortness of breath.   Cardiovascular: Negative for chest pain, palpitations and leg swelling.  Gastrointestinal: Negative for abdominal pain, diarrhea, constipation and abdominal distention.  Genitourinary: Negative for urgency, frequency, decreased urine volume and difficulty urinating.  Musculoskeletal: Negative for back pain, arthralgias and gait problem.  Skin: Negative for color change, pallor and rash.  Neurological: Negative for dizziness, light-headedness, numbness and headaches.  Hematological: Negative for adenopathy. Does not bruise/bleed easily.  Psychiatric/Behavioral: Negative for suicidal ideas, confusion, sleep disturbance, self-injury, dysphoric mood, decreased concentration and agitation.       Objective:    BP 126/67 (BP Location:  Right Arm, Cuff Size: Large)   Pulse 90   Temp 98.8 F (37.1 C) (Oral)   Resp 16   Ht 5\' 5"  (1.651 m)   Wt 225 lb 6.4 oz (102.2 kg)   SpO2 100%   BMI 37.51 kg/m  General appearance: alert, cooperative and no distress Head: Normocephalic, without obvious abnormality, atraumatic Eyes: conjunctivae/corneas clear. PERRL, EOM's intact. Fundi benign. Ears: normal TM's and external ear canals both ears Nose: Nares normal. Septum midline. Mucosa normal. No drainage or sinus tenderness. Throat: lips, mucosa, and tongue normal; teeth and gums normal Neck: no adenopathy, no carotid bruit, no JVD, supple, symmetrical, trachea midline and thyroid not enlarged, symmetric, no tenderness/mass/nodules Back: symmetric, no curvature. ROM normal. No CVA tenderness. Lungs: clear to auscultation bilaterally Breasts: gyn Heart: regular rate and rhythm, S1, S2 normal, no murmur, click, rub or gallop Abdomen: soft, non-tender; bowel sounds normal; no masses,  no organomegaly Pelvic: deferredgyn Extremities: extremities  normal, atraumatic, no cyanosis or edema Pulses: 2+ and symmetric Skin: Skin color, texture, turgor normal. No rashes or lesions Lymph nodes: Cervical, supraclavicular, and axillary nodes normal. Neurologic: Alert and oriented X 3, normal strength and tone. Normal symmetric reflexes. Normal coordination and gait    Diabetic Foot Exam - Simple   Simple Foot Form Diabetic Foot exam was performed with the following findings:  Yes 06/15/2018  4:19 PM  Visual Inspection No deformities, no ulcerations, no other skin breakdown bilaterally:  Yes Sensation Testing Intact to touch and monofilament testing bilaterally:  Yes Pulse Check Posterior Tibialis and Dorsalis pulse intact bilaterally:  Yes Comments     Assessment:    Healthy female exam.      Plan:    ghm utd Check labs  See After Visit Summary for Counseling Recommendations    1. Encounter for removal and reinsertion of  intrauterine contraceptive device  - Ambulatory referral to Gynecology  2. High risk heterosexual behavior  - HIV Antibody (routine testing w rflx) - RPR - Urine cytology ancillary only([place])  3. Preventative health care See above - CBC with Differential/Platelet - Comprehensive metabolic panel - Lipid panel - TSH  4. Hyperlipidemia LDL goal <70 Tolerating statin, encouraged heart healthy diet, avoid trans fats, minimize simple carbs and saturated fats. Increase exercise as tolerated - Hemoglobin A1c - Comprehensive metabolic panel - Lipid panel . 5. Essential hypertension Well controlled, no changes to meds. Encouraged heart healthy diet such as the DASH diet and exercise as tolerated.  - CBC with Differential/Platelet - Comprehensive metabolic panel

## 2018-06-16 LAB — CBC WITH DIFFERENTIAL/PLATELET
BASOS ABS: 0.1 10*3/uL (ref 0.0–0.1)
BASOS PCT: 1.2 % (ref 0.0–3.0)
EOS ABS: 0.1 10*3/uL (ref 0.0–0.7)
EOS PCT: 1.1 % (ref 0.0–5.0)
HEMATOCRIT: 39.8 % (ref 36.0–46.0)
Hemoglobin: 13.4 g/dL (ref 12.0–15.0)
LYMPHS ABS: 2.4 10*3/uL (ref 0.7–4.0)
Lymphocytes Relative: 46.7 % — ABNORMAL HIGH (ref 12.0–46.0)
MCHC: 33.8 g/dL (ref 30.0–36.0)
MCV: 85.5 fl (ref 78.0–100.0)
Monocytes Absolute: 0.5 10*3/uL (ref 0.1–1.0)
Monocytes Relative: 8.8 % (ref 3.0–12.0)
NEUTROS PCT: 42.2 % — AB (ref 43.0–77.0)
Neutro Abs: 2.2 10*3/uL (ref 1.4–7.7)
PLATELETS: 299 10*3/uL (ref 150.0–400.0)
RBC: 4.65 Mil/uL (ref 3.87–5.11)
RDW: 14.3 % (ref 11.5–15.5)
WBC: 5.2 10*3/uL (ref 4.0–10.5)

## 2018-06-16 LAB — COMPREHENSIVE METABOLIC PANEL
ALT: 32 U/L (ref 0–35)
AST: 16 U/L (ref 0–37)
Albumin: 4.6 g/dL (ref 3.5–5.2)
Alkaline Phosphatase: 76 U/L (ref 39–117)
BUN: 8 mg/dL (ref 6–23)
CALCIUM: 9.8 mg/dL (ref 8.4–10.5)
CHLORIDE: 100 meq/L (ref 96–112)
CO2: 25 meq/L (ref 19–32)
Creatinine, Ser: 0.6 mg/dL (ref 0.40–1.20)
GFR: 139.05 mL/min (ref >60.00)
GLUCOSE: 169 mg/dL — AB (ref 70–99)
Potassium: 3.4 mEq/L — ABNORMAL LOW (ref 3.5–5.1)
Sodium: 136 mEq/L (ref 135–145)
Total Bilirubin: 0.5 mg/dL (ref 0.2–1.2)
Total Protein: 7.5 g/dL (ref 6.0–8.3)

## 2018-06-16 LAB — LIPID PANEL
CHOL/HDL RATIO: 3
Cholesterol: 139 mg/dL (ref 0–200)
HDL: 46.8 mg/dL (ref >39.00)
LDL Cholesterol: 79 mg/dL (ref 0–99)
NONHDL: 92.11
TRIGLYCERIDES: 68 mg/dL (ref 0.0–149.0)
VLDL: 13.6 mg/dL (ref 0.0–40.0)

## 2018-06-16 LAB — HIV ANTIBODY (ROUTINE TESTING W REFLEX): HIV 1&2 Ab, 4th Generation: NONREACTIVE

## 2018-06-16 LAB — HEMOGLOBIN A1C: HEMOGLOBIN A1C: 8.2 % — AB (ref 4.6–6.5)

## 2018-06-16 LAB — TSH: TSH: 0.97 u[IU]/mL (ref 0.35–4.50)

## 2018-06-16 LAB — RPR: RPR: NONREACTIVE

## 2018-06-17 LAB — URINE CYTOLOGY ANCILLARY ONLY
Chlamydia: NEGATIVE
Neisseria Gonorrhea: NEGATIVE
Trichomonas: NEGATIVE

## 2018-06-18 ENCOUNTER — Telehealth: Payer: Self-pay

## 2018-06-18 ENCOUNTER — Other Ambulatory Visit: Payer: Self-pay

## 2018-06-18 LAB — URINE CYTOLOGY ANCILLARY ONLY: Candida vaginitis: NEGATIVE

## 2018-06-18 MED ORDER — EMPAGLIFLOZIN 10 MG PO TABS: 10.0000 mg | ORAL_TABLET | Freq: Every day | ORAL | 3 refills | Status: DC

## 2018-06-18 NOTE — Telephone Encounter (Signed)
PA initiated via Covermymeds; KEY: [id number]. Awaiting determination.

## 2018-06-18 NOTE — Telephone Encounter (Signed)
PA approved. Effective 06/18/2018 to 06/17/2021.

## 2018-06-21 ENCOUNTER — Other Ambulatory Visit: Payer: Self-pay | Admitting: Family Medicine

## 2018-06-21 DIAGNOSIS — B9689 Other specified bacterial agents as the cause of diseases classified elsewhere: Secondary | ICD-10-CM

## 2018-06-21 DIAGNOSIS — N760 Acute vaginitis: Principal | ICD-10-CM

## 2018-06-21 MED ORDER — METRONIDAZOLE 500 MG PO TABS: 500.0000 mg | ORAL_TABLET | Freq: Two times a day (BID) | ORAL | 0 refills | Status: DC

## 2018-06-23 ENCOUNTER — Other Ambulatory Visit: Payer: Self-pay | Admitting: Family Medicine

## 2018-06-23 DIAGNOSIS — I10 Essential (primary) hypertension: Secondary | ICD-10-CM

## 2018-06-23 MED ORDER — FLUCONAZOLE 150 MG PO TABS: ORAL_TABLET | ORAL | 0 refills | Status: DC

## 2018-07-01 ENCOUNTER — Ambulatory Visit: Payer: BLUE CROSS/BLUE SHIELD | Admitting: Psychology

## 2018-07-01 DIAGNOSIS — F4321 Adjustment disorder with depressed mood: Secondary | ICD-10-CM

## 2018-07-01 MED ORDER — LIRAGLUTIDE 18 MG/3ML ~~LOC~~ SOPN: PEN_INJECTOR | SUBCUTANEOUS | 3 refills | Status: DC

## 2018-07-01 NOTE — Addendum Note (Signed)
Addended by: Magdalene Molly A on: 07/01/2018 11:46 AM   Modules accepted: Orders

## 2018-07-02 ENCOUNTER — Other Ambulatory Visit: Payer: Self-pay | Admitting: *Deleted

## 2018-07-02 MED ORDER — LIRAGLUTIDE 18 MG/3ML ~~LOC~~ SOPN: PEN_INJECTOR | SUBCUTANEOUS | 3 refills | Status: DC

## 2018-07-05 ENCOUNTER — Telehealth: Payer: Self-pay

## 2018-07-05 NOTE — Telephone Encounter (Signed)
PA approved. Effective 07/05/2018 to 07/04/2021.

## 2018-07-05 NOTE — Telephone Encounter (Signed)
PA initiated via Covermymeds; KEY: [id number]. Awaiting determination.

## 2018-07-15 ENCOUNTER — Ambulatory Visit: Payer: Commercial Managed Care - PPO | Admitting: Psychology

## 2018-07-15 DIAGNOSIS — F4321 Adjustment disorder with depressed mood: Secondary | ICD-10-CM

## 2018-07-23 ENCOUNTER — Ambulatory Visit: Payer: Self-pay

## 2018-07-23 NOTE — Telephone Encounter (Signed)
janumet needs to be changed to just metformin  On to call in victoza needles jardiance also has cardiovascular benefits --- pt may need nurse visit to be taught how to use victoza

## 2018-07-23 NOTE — Telephone Encounter (Signed)
Incoming call from Patient, stating that she prefers not to take the Jardiance  Prescribed due to the many side effects.  Also states that She will Need pen needles for the Victozoa pen.  Questions if she is suppose to take the [place] also.  Patient awaits a return call from   PCP for clarification of medication.    Reason for Disposition . Caller has NON-URGENT medication question about med that PCP prescribed and triager unable to answer question  Protocols used: MEDICATION QUESTION CALL-A-AH

## 2018-07-27 MED ORDER — INSULIN PEN NEEDLE 32G X 4 MM MISC: 1 refills | Status: DC

## 2018-07-27 MED ORDER — METFORMIN HCL 1000 MG PO TABS: 1000.0000 mg | ORAL_TABLET | Freq: Two times a day (BID) | ORAL | 2 refills | Status: DC

## 2018-07-27 NOTE — Telephone Encounter (Signed)
Left detailed message on machine of med change and that it has been sent in and pen needles for the victoza.

## 2018-07-27 NOTE — Telephone Encounter (Signed)
Metformin 1000 mg bid #60  2 refills  

## 2018-07-27 NOTE — Telephone Encounter (Signed)
What mg and sig would you like for the metformin?

## 2018-07-27 NOTE — Addendum Note (Signed)
Addended by: Kem Boroughs D on: 07/27/2018 09:56 AM   Modules accepted: Orders

## 2018-07-29 ENCOUNTER — Other Ambulatory Visit: Payer: Self-pay | Admitting: Family Medicine

## 2018-07-29 ENCOUNTER — Ambulatory Visit (INDEPENDENT_AMBULATORY_CARE_PROVIDER_SITE_OTHER): Payer: Commercial Managed Care - PPO | Admitting: Psychology

## 2018-07-29 DIAGNOSIS — F4322 Adjustment disorder with anxiety: Secondary | ICD-10-CM | POA: Diagnosis not present

## 2018-08-10 ENCOUNTER — Other Ambulatory Visit: Payer: Self-pay | Admitting: Family Medicine

## 2018-08-10 DIAGNOSIS — J4520 Mild intermittent asthma, uncomplicated: Secondary | ICD-10-CM

## 2018-08-12 ENCOUNTER — Ambulatory Visit: Payer: Commercial Managed Care - PPO | Admitting: Psychology

## 2018-08-12 DIAGNOSIS — F4321 Adjustment disorder with depressed mood: Secondary | ICD-10-CM

## 2018-08-26 ENCOUNTER — Ambulatory Visit (INDEPENDENT_AMBULATORY_CARE_PROVIDER_SITE_OTHER): Payer: Commercial Managed Care - PPO | Admitting: Psychology

## 2018-08-26 DIAGNOSIS — F4322 Adjustment disorder with anxiety: Secondary | ICD-10-CM | POA: Diagnosis not present

## 2018-09-09 ENCOUNTER — Ambulatory Visit: Payer: Commercial Managed Care - PPO | Admitting: Psychology

## 2018-09-09 DIAGNOSIS — F4321 Adjustment disorder with depressed mood: Secondary | ICD-10-CM | POA: Diagnosis not present

## 2018-09-17 ENCOUNTER — Telehealth: Payer: Self-pay | Admitting: *Deleted

## 2018-09-17 NOTE — Telephone Encounter (Signed)
Received Medical records from [facility]; forwarded to provider/SLS 03/06

## 2018-09-24 ENCOUNTER — Encounter: Payer: Self-pay | Admitting: Family Medicine

## 2018-10-07 ENCOUNTER — Ambulatory Visit: Payer: Commercial Managed Care - PPO | Admitting: Psychology

## 2018-10-12 ENCOUNTER — Ambulatory Visit (INDEPENDENT_AMBULATORY_CARE_PROVIDER_SITE_OTHER): Payer: Commercial Managed Care - PPO | Admitting: Psychology

## 2018-10-12 DIAGNOSIS — F4322 Adjustment disorder with anxiety: Secondary | ICD-10-CM | POA: Diagnosis not present

## 2018-10-16 ENCOUNTER — Other Ambulatory Visit: Payer: Self-pay | Admitting: Family Medicine

## 2018-10-18 ENCOUNTER — Other Ambulatory Visit: Payer: Self-pay | Admitting: Family Medicine

## 2018-10-18 MED ORDER — INSULIN PEN NEEDLE 32G X 4 MM MISC: 1 refills | Status: DC

## 2018-11-01 ENCOUNTER — Ambulatory Visit (INDEPENDENT_AMBULATORY_CARE_PROVIDER_SITE_OTHER): Payer: Commercial Managed Care - PPO | Admitting: Psychology

## 2018-11-01 DIAGNOSIS — F4322 Adjustment disorder with anxiety: Secondary | ICD-10-CM | POA: Diagnosis not present

## 2018-11-05 ENCOUNTER — Other Ambulatory Visit: Payer: Self-pay | Admitting: Family Medicine

## 2018-11-23 ENCOUNTER — Ambulatory Visit (INDEPENDENT_AMBULATORY_CARE_PROVIDER_SITE_OTHER): Payer: Commercial Managed Care - PPO | Admitting: Psychology

## 2018-11-23 DIAGNOSIS — F4322 Adjustment disorder with anxiety: Secondary | ICD-10-CM

## 2018-11-30 ENCOUNTER — Telehealth: Payer: Self-pay | Admitting: Family Medicine

## 2018-11-30 NOTE — Telephone Encounter (Signed)
Copied from [place] [id number]. Topic: Quick Communication - Rx Refill/Question >> Nov 30, 2018  9:10 AM Erick Blinks wrote: Medication: fluconazole (DIFLUCAN) 150 MG tablet + Insulin Pen Needle (BD PEN NEEDLE NANO 2ND GEN) 32G X 4 MM MISC -Pt called requesting refill. She is scheduled with Dr. Carollee Herter 12/16/2018. Please advise.  Has the patient contacted their pharmacy? Yes (Agent: If no, request that the patient contact the pharmacy for the refill.) (Agent: If yes, when and what did the pharmacy advise?)  Preferred Pharmacy (with phone number or street name): [id number] [place], [address] [place] [address]    Agent: Please be advised that RX refills may take up to 3 business days. We ask that you follow-up with your pharmacy.

## 2018-11-30 NOTE — Telephone Encounter (Signed)
Don't usually refill diflucan--- what is going on?

## 2018-11-30 NOTE — Telephone Encounter (Signed)
Please advise regarding diflucan refill.  

## 2018-12-01 ENCOUNTER — Other Ambulatory Visit: Payer: Self-pay | Admitting: Family Medicine

## 2018-12-01 DIAGNOSIS — N760 Acute vaginitis: Secondary | ICD-10-CM

## 2018-12-01 MED ORDER — FLUCONAZOLE 150 MG PO TABS: ORAL_TABLET | ORAL | 0 refills | Status: DC

## 2018-12-01 MED ORDER — INSULIN PEN NEEDLE 32G X 4 MM MISC: 1 refills | Status: DC

## 2018-12-01 NOTE — Telephone Encounter (Signed)
Sent in but if no better after taking she will need office visit

## 2018-12-01 NOTE — Telephone Encounter (Signed)
Patient states that she is having vaginal itching and cottage cheese discharge.  She states usually has this.  She does not see how a virtual visit will make sense for this issue.

## 2018-12-02 MED ORDER — INSULIN PEN NEEDLE 32G X 4 MM MISC: 3 refills | Status: DC

## 2018-12-02 NOTE — Telephone Encounter (Signed)
Patient notified that medication was sent in.

## 2018-12-16 ENCOUNTER — Ambulatory Visit: Payer: Self-pay | Admitting: Family Medicine

## 2018-12-16 ENCOUNTER — Ambulatory Visit (INDEPENDENT_AMBULATORY_CARE_PROVIDER_SITE_OTHER): Payer: Commercial Managed Care - PPO | Admitting: Psychology

## 2018-12-16 DIAGNOSIS — F4322 Adjustment disorder with anxiety: Secondary | ICD-10-CM

## 2018-12-20 ENCOUNTER — Encounter: Payer: Self-pay | Admitting: Family Medicine

## 2018-12-20 ENCOUNTER — Other Ambulatory Visit: Payer: Self-pay

## 2018-12-20 ENCOUNTER — Ambulatory Visit (INDEPENDENT_AMBULATORY_CARE_PROVIDER_SITE_OTHER): Payer: Commercial Managed Care - PPO | Admitting: Family Medicine

## 2018-12-20 DIAGNOSIS — I10 Essential (primary) hypertension: Secondary | ICD-10-CM | POA: Diagnosis not present

## 2018-12-20 DIAGNOSIS — E785 Hyperlipidemia, unspecified: Secondary | ICD-10-CM

## 2018-12-20 DIAGNOSIS — E1165 Type 2 diabetes mellitus with hyperglycemia: Secondary | ICD-10-CM | POA: Diagnosis not present

## 2018-12-20 DIAGNOSIS — E1169 Type 2 diabetes mellitus with other specified complication: Secondary | ICD-10-CM

## 2018-12-20 MED ORDER — LIRAGLUTIDE 18 MG/3ML ~~LOC~~ SOPN: PEN_INJECTOR | SUBCUTANEOUS | 3 refills | Status: DC

## 2018-12-20 NOTE — Progress Notes (Signed)
Virtual Visit via Video Note  I connected with Jasmine Hinton on 12/20/18 at 10:15 AM EDT by a video enabled telemedicine application and verified that I am speaking with the correct person using two identifiers.  Location: Patient: home Provider: home    I discussed the limitations of evaluation and management by telemedicine and the availability of in person appointments. The patient expressed understanding and agreed to proceed.  History of Present Illness: Pt is home and has no complaints     HYPERTENSION   Blood pressure range-not checking   Chest pain- no      Dyspnea- no Lightheadedness- no   Edema- no  Other side effects - good   Medication compliance: good  Low salt diet- yes    DIABETES    Blood Sugar ranges-not checked   Polyuria- no New Visual problems- no  Hypoglycemic symptoms- no  Other side effects-no Medication compliance - good Last eye exam- dec 2019    HYPERLIPIDEMIA  Medication compliance- good  RUQ pain- no  Muscle aches- no Other side effects-no   Past Medical History:  Diagnosis Date  . Anxiety   . Depression   . Diabetes mellitus   . Hypertension    Current Outpatient Medications on File Prior to Visit  Medication Sig Dispense Refill  . albuterol (PROAIR HFA) 108 (90 Base) MCG/ACT inhaler Inhale 2 puffs into the lungs every 6 (six) hours as needed for wheezing or shortness of breath. 1 Inhaler 5  . atorvastatin (LIPITOR) 20 MG tablet TAKE 1 TABLET BY MOUTH EVERY DAY 90 tablet 0  . citalopram (CELEXA) 20 MG tablet Take 1 tablet (20 mg total) by mouth daily. 90 tablet 3  . clonazePAM (KLONOPIN) 1 MG tablet 1 po tid prn 30 tablet 1  . fluticasone (FLONASE) 50 MCG/ACT nasal spray Place 2 sprays into both nostrils daily. 16 g 5  . glucose blood (ONETOUCH VERIO) test strip Use as instructed twice a day.  E11.9 100 each 1  . Insulin Pen Needle (BD PEN NEEDLE NANO 2ND GEN) 32G X 4 MM MISC Use as directed with Victoza once a day 100 each 3  .  levonorgestrel (MIRENA) 20 MCG/24HR IUD 1 each by Intrauterine route once.      Marland Kitchen lisinopril-hydrochlorothiazide (PRINZIDE,ZESTORETIC) 10-12.5 MG tablet TAKE 1 TABLET BY MOUTH DAILY. NEEDS OV BEFORE ANYMORE REFILLS 90 tablet 1  . montelukast (SINGULAIR) 10 MG tablet TAKE 1 TABLET BY MOUTH EVERYDAY AT BEDTIME 90 tablet 1  . ONETOUCH DELICA LANCETS FINE MISC Use as instructed twice a day.  E11.9 100 each 1  . atorvastatin (LIPITOR) 20 MG tablet TAKE 1 TABLET BY MOUTH EVERY DAY 90 tablet 0   No current facility-administered medications on file prior to visit.       Observations/Objective: Unable to get vitals except temp--  Afebrile  Pt is nad   Assessment and Plan: 1. Type 2 diabetes mellitus with hyperglycemia, without long-term current use of insulin (HCC) Check labs  hgba1c to be checked , minimize simple carbs. Increase exercise as tolerated. Continue current meds  - Hemoglobin A1c; Future - Comprehensive metabolic panel; Future - Microalbumin / creatinine urine ratio; Future - liraglutide (VICTOZA) 18 MG/3ML SOPN; 1.8 mg Sq daly  Dispense: 4 pen; Refill: 3  2. Essential hypertension Well controlled, no changes to meds. Encouraged heart healthy diet such as the DASH diet and exercise as tolerated. --was not able to check today - Comprehensive metabolic panel; Future - Microalbumin / creatinine urine ratio;  Future  3. Hyperlipidemia associated with type 2 diabetes mellitus ([place]) Encouraged heart healthy diet, increase exercise, avoid trans fats, consider a krill oil cap daily - Lipid panel; Future - Comprehensive metabolic panel; Future   Follow Up Instructions:    I discussed the assessment and treatment plan with the patient. The patient was provided an opportunity to ask questions and all were answered. The patient agreed with the plan and demonstrated an understanding of the instructions.   The patient was advised to call back or seek an in-person evaluation if the  symptoms worsen or if the condition fails to improve as anticipated.  I provided 25 minutes of non-face-to-face time during this encounter.   Ann Held, DO

## 2019-01-05 ENCOUNTER — Ambulatory Visit (INDEPENDENT_AMBULATORY_CARE_PROVIDER_SITE_OTHER): Payer: Commercial Managed Care - PPO | Admitting: Psychology

## 2019-01-05 DIAGNOSIS — F4322 Adjustment disorder with anxiety: Secondary | ICD-10-CM

## 2019-01-27 ENCOUNTER — Ambulatory Visit: Payer: Commercial Managed Care - PPO | Admitting: Psychology

## 2019-02-02 ENCOUNTER — Other Ambulatory Visit: Payer: Self-pay

## 2019-02-02 ENCOUNTER — Other Ambulatory Visit (INDEPENDENT_AMBULATORY_CARE_PROVIDER_SITE_OTHER): Payer: Commercial Managed Care - PPO

## 2019-02-02 DIAGNOSIS — E785 Hyperlipidemia, unspecified: Secondary | ICD-10-CM

## 2019-02-02 DIAGNOSIS — E1169 Type 2 diabetes mellitus with other specified complication: Secondary | ICD-10-CM

## 2019-02-02 DIAGNOSIS — I10 Essential (primary) hypertension: Secondary | ICD-10-CM

## 2019-02-02 DIAGNOSIS — E1165 Type 2 diabetes mellitus with hyperglycemia: Secondary | ICD-10-CM | POA: Diagnosis not present

## 2019-02-02 LAB — COMPREHENSIVE METABOLIC PANEL
ALT: 29 U/L (ref 0–35)
AST: 16 U/L (ref 0–37)
Albumin: 4.3 g/dL (ref 3.5–5.2)
Alkaline Phosphatase: 75 U/L (ref 39–117)
BUN: 9 mg/dL (ref 6–23)
CO2: 26 mEq/L (ref 19–32)
Calcium: 9.4 mg/dL (ref 8.4–10.5)
Chloride: 102 mEq/L (ref 96–112)
Creatinine, Ser: 0.61 mg/dL (ref 0.40–1.20)
GFR: 127.99 mL/min (ref >60.00)
Glucose, Bld: 144 mg/dL — ABNORMAL HIGH (ref 70–99)
Potassium: 3.6 mEq/L (ref 3.5–5.1)
Sodium: 136 mEq/L (ref 135–145)
Total Bilirubin: 0.6 mg/dL (ref 0.2–1.2)
Total Protein: 6.9 g/dL (ref 6.0–8.3)

## 2019-02-02 LAB — LIPID PANEL
Cholesterol: 122 mg/dL (ref 0–200)
HDL: 43.2 mg/dL (ref >39.00)
LDL Cholesterol: 68 mg/dL (ref 0–99)
NonHDL: 78.63
Total CHOL/HDL Ratio: 3
Triglycerides: 54 mg/dL (ref 0.0–149.0)
VLDL: 10.8 mg/dL (ref 0.0–40.0)

## 2019-02-02 LAB — MICROALBUMIN / CREATININE URINE RATIO
Creatinine,U: 237.7 mg/dL
Microalb Creat Ratio: 0.5 mg/g (ref 0.0–30.0)
Microalb, Ur: 1.1 mg/dL (ref 0.0–1.9)

## 2019-02-02 LAB — HEMOGLOBIN A1C: Hgb A1c MFr Bld: 7.8 % — ABNORMAL HIGH (ref 4.6–6.5)

## 2019-02-04 ENCOUNTER — Other Ambulatory Visit: Payer: Self-pay | Admitting: Family Medicine

## 2019-02-06 ENCOUNTER — Other Ambulatory Visit: Payer: Self-pay | Admitting: Family Medicine

## 2019-02-06 DIAGNOSIS — E1165 Type 2 diabetes mellitus with hyperglycemia: Secondary | ICD-10-CM

## 2019-02-06 DIAGNOSIS — E785 Hyperlipidemia, unspecified: Secondary | ICD-10-CM

## 2019-02-06 DIAGNOSIS — E1169 Type 2 diabetes mellitus with other specified complication: Secondary | ICD-10-CM

## 2019-02-07 ENCOUNTER — Other Ambulatory Visit: Payer: Self-pay

## 2019-02-07 MED ORDER — JARDIANCE 10 MG PO TABS: 10.0000 mg | ORAL_TABLET | Freq: Every day | ORAL | 2 refills | Status: DC

## 2019-02-13 ENCOUNTER — Other Ambulatory Visit: Payer: Self-pay | Admitting: Family Medicine

## 2019-02-14 ENCOUNTER — Telehealth: Payer: Self-pay | Admitting: Family Medicine

## 2019-02-14 NOTE — Telephone Encounter (Signed)
Pt called and stated that she has some concerns regarding empagliflozin (JARDIANCE) 10 MG TABS tablet [[id number]] pt would like to  Take metformin instead. Pt would like a call back from the nurse.

## 2019-02-14 NOTE — Telephone Encounter (Signed)
Patient stated that she has not started the Jardiance yet.  She spoke with pharmacist about the medication and advised her about the side effect of frequent UTIs and she does not want to have to deal with that.  She would like to see if she could go back on metformin or Janumet.  She stated that she has been doing ok with Vicoza and exercising.

## 2019-02-16 NOTE — Telephone Encounter (Signed)
janumet xr 50/1000  2 po qd  #60  2 refills  Repeat labs in 3 months

## 2019-02-16 NOTE — Telephone Encounter (Signed)
Patient stated that she did not have any side effects to the metformin.  Also advised that she cannot take Victoza and Janumet together.

## 2019-02-17 ENCOUNTER — Ambulatory Visit (INDEPENDENT_AMBULATORY_CARE_PROVIDER_SITE_OTHER): Payer: Commercial Managed Care - PPO | Admitting: Psychology

## 2019-02-17 DIAGNOSIS — F4322 Adjustment disorder with anxiety: Secondary | ICD-10-CM | POA: Diagnosis not present

## 2019-02-17 MED ORDER — JANUMET XR 50-1000 MG PO TB24: 2.0000 | ORAL_TABLET | Freq: Every day | ORAL | 2 refills | Status: DC

## 2019-02-17 NOTE — Telephone Encounter (Signed)
Left message on machine that we sent in medication.

## 2019-03-18 ENCOUNTER — Ambulatory Visit: Payer: Commercial Managed Care - PPO | Admitting: Psychology

## 2019-04-05 ENCOUNTER — Other Ambulatory Visit: Payer: Self-pay | Admitting: Family Medicine

## 2019-04-05 DIAGNOSIS — E1165 Type 2 diabetes mellitus with hyperglycemia: Secondary | ICD-10-CM

## 2019-04-07 ENCOUNTER — Ambulatory Visit (INDEPENDENT_AMBULATORY_CARE_PROVIDER_SITE_OTHER): Payer: Commercial Managed Care - PPO | Admitting: Psychology

## 2019-04-07 DIAGNOSIS — F4322 Adjustment disorder with anxiety: Secondary | ICD-10-CM | POA: Diagnosis not present

## 2019-05-02 ENCOUNTER — Ambulatory Visit (INDEPENDENT_AMBULATORY_CARE_PROVIDER_SITE_OTHER): Payer: Commercial Managed Care - PPO | Admitting: Psychology

## 2019-05-02 DIAGNOSIS — F4322 Adjustment disorder with anxiety: Secondary | ICD-10-CM | POA: Diagnosis not present

## 2019-06-02 ENCOUNTER — Ambulatory Visit (INDEPENDENT_AMBULATORY_CARE_PROVIDER_SITE_OTHER): Payer: Commercial Managed Care - PPO | Admitting: Psychology

## 2019-06-02 DIAGNOSIS — F4322 Adjustment disorder with anxiety: Secondary | ICD-10-CM | POA: Diagnosis not present

## 2019-06-21 ENCOUNTER — Encounter: Payer: Self-pay | Admitting: Family Medicine

## 2019-06-21 ENCOUNTER — Other Ambulatory Visit: Payer: Self-pay

## 2019-06-21 ENCOUNTER — Ambulatory Visit (INDEPENDENT_AMBULATORY_CARE_PROVIDER_SITE_OTHER): Payer: Commercial Managed Care - PPO | Admitting: Family Medicine

## 2019-06-21 DIAGNOSIS — E785 Hyperlipidemia, unspecified: Secondary | ICD-10-CM | POA: Diagnosis not present

## 2019-06-21 DIAGNOSIS — E1165 Type 2 diabetes mellitus with hyperglycemia: Secondary | ICD-10-CM | POA: Diagnosis not present

## 2019-06-21 DIAGNOSIS — E1169 Type 2 diabetes mellitus with other specified complication: Secondary | ICD-10-CM

## 2019-06-21 DIAGNOSIS — I10 Essential (primary) hypertension: Secondary | ICD-10-CM

## 2019-06-21 MED ORDER — VICTOZA 18 MG/3ML ~~LOC~~ SOPN: PEN_INJECTOR | SUBCUTANEOUS | 1 refills | Status: DC

## 2019-06-21 NOTE — Progress Notes (Signed)
Virtual Visit via Video Note  I connected with Sokhna Onion Bralley on 06/21/19 at  1:40 PM EST by a video enabled telemedicine application and verified that I am speaking with the correct person using two identifiers.  Location: Patient: work alone Provider: home    I discussed the limitations of evaluation and management by telemedicine and the availability of in person appointments. The patient expressed understanding and agreed to proceed.  History of Present Illness:  HPI HYPERTENSION   Blood pressure range-not checking   Chest pain- no      Dyspnea- no Lightheadedness- no   Edema- no  Other side effects - no   Medication compliance: good Low salt diet- yes    DIABETES    Blood Sugar ranges-100-102  Polyuria- no New Visual problems- no  Hypoglycemic symptoms- no  Other side effects-no Medication compliance - good Last eye exam- Jan     HYPERLIPIDEMIA  Medication compliance- good  RUQ pain- no   Muscle aches- no  Other side effects-no      Past Medical History:  Diagnosis Date  . Anxiety   . Depression   . Diabetes mellitus   . Hypertension    Current Outpatient Medications on File Prior to Visit  Medication Sig Dispense Refill  . albuterol (PROAIR HFA) 108 (90 Base) MCG/ACT inhaler Inhale 2 puffs into the lungs every 6 (six) hours as needed for wheezing or shortness of breath. 1 Inhaler 5  . atorvastatin (LIPITOR) 20 MG tablet TAKE 1 TABLET BY MOUTH EVERY DAY 90 tablet 0  . atorvastatin (LIPITOR) 20 MG tablet TAKE 1 TABLET BY MOUTH EVERY DAY 90 tablet 1  . citalopram (CELEXA) 20 MG tablet Take 1 tablet (20 mg total) by mouth daily. 90 tablet 3  . clonazePAM (KLONOPIN) 1 MG tablet 1 po tid prn 30 tablet 1  . fluticasone (FLONASE) 50 MCG/ACT nasal spray Place 2 sprays into both nostrils daily. 16 g 5  . glucose blood (ONETOUCH VERIO) test strip Use as instructed twice a day.  E11.9 100 each 1  . Insulin Pen Needle (BD PEN NEEDLE NANO 2ND GEN) 32G X 4 MM MISC Use  as directed with Victoza once a day 100 each 3  . levonorgestrel (MIRENA) 20 MCG/24HR IUD 1 each by Intrauterine route once.      Marland Kitchen lisinopril-hydrochlorothiazide (ZESTORETIC) 10-12.5 MG tablet TAKE 1 TABLET BY MOUTH DAILY. NEEDS OV BEFORE ANYMORE REFILLS 90 tablet 1  . montelukast (SINGULAIR) 10 MG tablet TAKE 1 TABLET BY MOUTH EVERYDAY AT BEDTIME 90 tablet 1  . ONETOUCH DELICA LANCETS FINE MISC Use as instructed twice a day.  E11.9 100 each 1  . SitaGLIPtin-MetFORMIN HCl (JANUMET XR) 50-1000 MG TB24 Take 2 tablets by mouth daily. 30 tablet 2   No current facility-administered medications on file prior to visit.    Past Surgical History:  Procedure Laterality Date  . TONSILLECTOMY    Observations/Objective: Temp 98  No other vitals obtained Pt is in NAD Assessment and Plan: 1. Type 2 diabetes mellitus with hyperglycemia, without long-term current use of insulin (HCC) hgba1c to be checked , minimize simple carbs. Increase exercise as tolerated. Continue current meds  - liraglutide (VICTOZA) 18 MG/3ML SOPN; INJECT 1.8 MG UNDER THE SKIN DAILY  Dispense: 3 mL; Refill: 1  2. Essential hypertension Well controlled, no changes to meds. Encouraged heart healthy diet such as the DASH diet and exercise as tolerated.   3. Hyperlipidemia associated with type 2 diabetes mellitus ([place]) Encouraged heart  healthy diet, increase exercise, avoid trans fats, consider a krill oil cap daily   Follow Up Instructions:    I discussed the assessment and treatment plan with the patient. The patient was provided an opportunity to ask questions and all were answered. The patient agreed with the plan and demonstrated an understanding of the instructions.   The patient was advised to call back or seek an in-person evaluation if the symptoms worsen or if the condition fails to improve as anticipated.  I provided 15 minutes of non-face-to-face time during this encounter.   Ann Held, DO

## 2019-06-27 ENCOUNTER — Ambulatory Visit: Payer: Commercial Managed Care - PPO | Admitting: Psychology

## 2019-07-21 ENCOUNTER — Other Ambulatory Visit: Payer: Self-pay | Admitting: Family Medicine

## 2019-07-21 DIAGNOSIS — J4520 Mild intermittent asthma, uncomplicated: Secondary | ICD-10-CM

## 2019-07-21 LAB — HM PAP SMEAR: HM Pap smear: NEGATIVE

## 2019-07-25 LAB — HM PAP SMEAR: HPV, high-risk: NEGATIVE

## 2019-07-31 ENCOUNTER — Other Ambulatory Visit: Payer: Self-pay | Admitting: Family Medicine

## 2019-08-04 ENCOUNTER — Ambulatory Visit (INDEPENDENT_AMBULATORY_CARE_PROVIDER_SITE_OTHER): Payer: Commercial Managed Care - PPO | Admitting: Psychology

## 2019-08-04 DIAGNOSIS — F4322 Adjustment disorder with anxiety: Secondary | ICD-10-CM | POA: Diagnosis not present

## 2019-08-13 ENCOUNTER — Other Ambulatory Visit: Payer: Self-pay | Admitting: Family Medicine

## 2019-08-18 ENCOUNTER — Other Ambulatory Visit: Payer: Self-pay | Admitting: Family Medicine

## 2019-08-18 DIAGNOSIS — E1165 Type 2 diabetes mellitus with hyperglycemia: Secondary | ICD-10-CM

## 2019-08-22 ENCOUNTER — Other Ambulatory Visit: Payer: Self-pay | Admitting: Family Medicine

## 2019-08-22 DIAGNOSIS — F418 Other specified anxiety disorders: Secondary | ICD-10-CM

## 2019-09-01 ENCOUNTER — Ambulatory Visit (INDEPENDENT_AMBULATORY_CARE_PROVIDER_SITE_OTHER): Payer: Commercial Managed Care - PPO | Admitting: Psychology

## 2019-09-01 DIAGNOSIS — F4322 Adjustment disorder with anxiety: Secondary | ICD-10-CM

## 2019-09-09 NOTE — Telephone Encounter (Signed)
08/24/19 mychart message came back as unread. Mailed letter to pt.

## 2019-09-29 ENCOUNTER — Ambulatory Visit (INDEPENDENT_AMBULATORY_CARE_PROVIDER_SITE_OTHER): Payer: Commercial Managed Care - PPO | Admitting: Psychology

## 2019-09-29 DIAGNOSIS — F4322 Adjustment disorder with anxiety: Secondary | ICD-10-CM | POA: Diagnosis not present

## 2019-10-15 ENCOUNTER — Other Ambulatory Visit: Payer: Self-pay | Admitting: Family Medicine

## 2019-10-15 DIAGNOSIS — J4520 Mild intermittent asthma, uncomplicated: Secondary | ICD-10-CM

## 2019-10-27 ENCOUNTER — Ambulatory Visit: Payer: Commercial Managed Care - PPO | Admitting: Psychology

## 2019-11-15 ENCOUNTER — Other Ambulatory Visit: Payer: Self-pay | Admitting: Family Medicine

## 2019-11-15 DIAGNOSIS — F418 Other specified anxiety disorders: Secondary | ICD-10-CM

## 2019-12-01 ENCOUNTER — Ambulatory Visit (INDEPENDENT_AMBULATORY_CARE_PROVIDER_SITE_OTHER): Payer: Commercial Managed Care - PPO | Admitting: Psychology

## 2019-12-01 DIAGNOSIS — F4322 Adjustment disorder with anxiety: Secondary | ICD-10-CM | POA: Diagnosis not present

## 2020-01-05 ENCOUNTER — Ambulatory Visit (INDEPENDENT_AMBULATORY_CARE_PROVIDER_SITE_OTHER): Payer: Commercial Managed Care - PPO | Admitting: Psychology

## 2020-01-05 DIAGNOSIS — F4322 Adjustment disorder with anxiety: Secondary | ICD-10-CM

## 2020-01-17 ENCOUNTER — Other Ambulatory Visit: Payer: Self-pay | Admitting: Family Medicine

## 2020-01-17 DIAGNOSIS — J4520 Mild intermittent asthma, uncomplicated: Secondary | ICD-10-CM

## 2020-02-09 ENCOUNTER — Ambulatory Visit (INDEPENDENT_AMBULATORY_CARE_PROVIDER_SITE_OTHER): Payer: Commercial Managed Care - PPO | Admitting: Psychology

## 2020-02-09 DIAGNOSIS — F4322 Adjustment disorder with anxiety: Secondary | ICD-10-CM | POA: Diagnosis not present

## 2020-02-11 ENCOUNTER — Other Ambulatory Visit: Payer: Self-pay | Admitting: Family Medicine

## 2020-02-11 DIAGNOSIS — F418 Other specified anxiety disorders: Secondary | ICD-10-CM

## 2020-02-12 ENCOUNTER — Other Ambulatory Visit: Payer: Self-pay | Admitting: Family Medicine

## 2020-02-12 DIAGNOSIS — E1165 Type 2 diabetes mellitus with hyperglycemia: Secondary | ICD-10-CM

## 2020-02-14 ENCOUNTER — Other Ambulatory Visit: Payer: Self-pay | Admitting: Family Medicine

## 2020-02-14 ENCOUNTER — Other Ambulatory Visit: Payer: Self-pay

## 2020-02-14 MED ORDER — LISINOPRIL-HYDROCHLOROTHIAZIDE 10-12.5 MG PO TABS: 1.0000 | ORAL_TABLET | Freq: Every day | ORAL | 0 refills | Status: DC

## 2020-02-21 ENCOUNTER — Other Ambulatory Visit: Payer: Self-pay | Admitting: Family Medicine

## 2020-02-21 DIAGNOSIS — J4520 Mild intermittent asthma, uncomplicated: Secondary | ICD-10-CM

## 2020-03-14 ENCOUNTER — Ambulatory Visit: Payer: Commercial Managed Care - PPO | Admitting: Psychology

## 2020-03-17 ENCOUNTER — Other Ambulatory Visit: Payer: Self-pay | Admitting: Family Medicine

## 2020-03-17 DIAGNOSIS — J4520 Mild intermittent asthma, uncomplicated: Secondary | ICD-10-CM

## 2020-03-23 ENCOUNTER — Other Ambulatory Visit: Payer: Self-pay | Admitting: Family Medicine

## 2020-03-23 DIAGNOSIS — J4520 Mild intermittent asthma, uncomplicated: Secondary | ICD-10-CM

## 2020-04-02 ENCOUNTER — Ambulatory Visit (INDEPENDENT_AMBULATORY_CARE_PROVIDER_SITE_OTHER): Payer: Commercial Managed Care - PPO | Admitting: Psychology

## 2020-04-02 DIAGNOSIS — F4322 Adjustment disorder with anxiety: Secondary | ICD-10-CM

## 2020-04-05 ENCOUNTER — Other Ambulatory Visit: Payer: Self-pay | Admitting: Family Medicine

## 2020-04-05 DIAGNOSIS — F418 Other specified anxiety disorders: Secondary | ICD-10-CM

## 2020-04-18 ENCOUNTER — Telehealth: Payer: Self-pay | Admitting: Family Medicine

## 2020-04-18 DIAGNOSIS — J4520 Mild intermittent asthma, uncomplicated: Secondary | ICD-10-CM

## 2020-04-18 MED ORDER — MONTELUKAST SODIUM 10 MG PO TABS: ORAL_TABLET | ORAL | 0 refills | Status: DC

## 2020-04-18 NOTE — Telephone Encounter (Signed)
Medication: montelukast (SINGULAIR) 10 MG tablet  Has the patient contacted their pharmacy? No. (If no, request that the patient contact the pharmacy for the refill.) (If yes, when and what did the pharmacy advise?)  Preferred Pharmacy (with phone number or street name):  [id number] - [place], [place] [address] [place], [place] [address]  Phone:  [phone] Fax:  [phone]  DEA #:  [id number]  Agent: Please be advised that RX refills may take up to 3 business days. We ask that you follow-up with your pharmacy.

## 2020-04-18 NOTE — Telephone Encounter (Signed)
Refill sent.

## 2020-05-03 ENCOUNTER — Ambulatory Visit (INDEPENDENT_AMBULATORY_CARE_PROVIDER_SITE_OTHER): Payer: Commercial Managed Care - PPO | Admitting: Psychology

## 2020-05-03 DIAGNOSIS — F4322 Adjustment disorder with anxiety: Secondary | ICD-10-CM

## 2020-05-29 ENCOUNTER — Other Ambulatory Visit: Payer: Self-pay

## 2020-05-29 ENCOUNTER — Ambulatory Visit: Payer: Commercial Managed Care - PPO | Attending: Internal Medicine

## 2020-05-29 ENCOUNTER — Encounter: Payer: Self-pay | Admitting: Family Medicine

## 2020-05-29 ENCOUNTER — Ambulatory Visit (INDEPENDENT_AMBULATORY_CARE_PROVIDER_SITE_OTHER): Payer: Commercial Managed Care - PPO | Admitting: Family Medicine

## 2020-05-29 VITALS — BP 120/60 | HR 100 | Temp 98.9°F | Resp 18 | Ht 65.0 in | Wt 222.2 lb

## 2020-05-29 DIAGNOSIS — M25511 Pain in right shoulder: Secondary | ICD-10-CM | POA: Diagnosis not present

## 2020-05-29 DIAGNOSIS — E785 Hyperlipidemia, unspecified: Secondary | ICD-10-CM

## 2020-05-29 DIAGNOSIS — I10 Essential (primary) hypertension: Secondary | ICD-10-CM

## 2020-05-29 DIAGNOSIS — G8929 Other chronic pain: Secondary | ICD-10-CM

## 2020-05-29 DIAGNOSIS — E1169 Type 2 diabetes mellitus with other specified complication: Secondary | ICD-10-CM

## 2020-05-29 DIAGNOSIS — Z0000 Encounter for general adult medical examination without abnormal findings: Secondary | ICD-10-CM | POA: Diagnosis not present

## 2020-05-29 DIAGNOSIS — Z1159 Encounter for screening for other viral diseases: Secondary | ICD-10-CM | POA: Diagnosis not present

## 2020-05-29 DIAGNOSIS — J4520 Mild intermittent asthma, uncomplicated: Secondary | ICD-10-CM | POA: Diagnosis not present

## 2020-05-29 DIAGNOSIS — E1165 Type 2 diabetes mellitus with hyperglycemia: Secondary | ICD-10-CM

## 2020-05-29 DIAGNOSIS — Z23 Encounter for immunization: Secondary | ICD-10-CM

## 2020-05-29 DIAGNOSIS — G5601 Carpal tunnel syndrome, right upper limb: Secondary | ICD-10-CM | POA: Diagnosis not present

## 2020-05-29 MED ORDER — MONTELUKAST SODIUM 10 MG PO TABS: ORAL_TABLET | ORAL | 3 refills | Status: DC

## 2020-05-29 NOTE — Patient Instructions (Signed)
Preventive Care 40-47 Years Old, Female °Preventive care refers to visits with your health care provider and lifestyle choices that can promote health and wellness. This includes: °· A yearly physical exam. This may also be called an annual well check. °· Regular dental visits and eye exams. °· Immunizations. °· Screening for certain conditions. °· Healthy lifestyle choices, such as eating a healthy diet, getting regular exercise, not using drugs or products that contain nicotine and tobacco, and limiting alcohol use. °What can I expect for my preventive care visit? °Physical exam °Your health care provider will check your: °· Height and weight. This may be used to calculate body mass index (BMI), which tells if you are at a healthy weight. °· Heart rate and blood pressure. °· Skin for abnormal spots. °Counseling °Your health care provider may ask you questions about your: °· Alcohol, tobacco, and drug use. °· Emotional well-being. °· Home and relationship well-being. °· Sexual activity. °· Eating habits. °· Work and work environment. °· Method of birth control. °· Menstrual cycle. °· Pregnancy history. °What immunizations do I need? ° °Influenza (flu) vaccine °· This is recommended every year. °Tetanus, diphtheria, and pertussis (Tdap) vaccine °· You may need a Td booster every 10 years. °Varicella (chickenpox) vaccine °· You may need this if you have not been vaccinated. °Zoster (shingles) vaccine °· You may need this after age 60. °Measles, mumps, and rubella (MMR) vaccine °· You may need at least one dose of MMR if you were born in 1957 or later. You may also need a second dose. °Pneumococcal conjugate (PCV13) vaccine °· You may need this if you have certain conditions and were not previously vaccinated. °Pneumococcal polysaccharide (PPSV23) vaccine °· You may need one or two doses if you smoke cigarettes or if you have certain conditions. °Meningococcal conjugate (MenACWY) vaccine °· You may need this if you  have certain conditions. °Hepatitis A vaccine °· You may need this if you have certain conditions or if you travel or work in places where you may be exposed to hepatitis A. °Hepatitis B vaccine °· You may need this if you have certain conditions or if you travel or work in places where you may be exposed to hepatitis B. °Haemophilus influenzae type b (Hib) vaccine °· You may need this if you have certain conditions. °Human papillomavirus (HPV) vaccine °· If recommended by your health care provider, you may need three doses over 6 months. °You may receive vaccines as individual doses or as more than one vaccine together in one shot (combination vaccines). Talk with your health care provider about the risks and benefits of combination vaccines. °What tests do I need? °Blood tests °· Lipid and cholesterol levels. These may be checked every 5 years, or more frequently if you are over 50 years old. °· Hepatitis C test. °· Hepatitis B test. °Screening °· Lung cancer screening. You may have this screening every year starting at age 55 if you have a 30-pack-year history of smoking and currently smoke or have quit within the past 15 years. °· Colorectal cancer screening. All adults should have this screening starting at age 50 and continuing until age 75. Your health care provider may recommend screening at age 45 if you are at increased risk. You will have tests every 1-10 years, depending on your results and the type of screening test. °· Diabetes screening. This is done by checking your blood sugar (glucose) after you have not eaten for a while (fasting). You may have this   done every 1-3 years.  Mammogram. This may be done every 1-2 years. Talk with your health care provider about when you should start having regular mammograms. This may depend on whether you have a family history of breast cancer.  BRCA-related cancer screening. This may be done if you have a family history of breast, ovarian, tubal, or peritoneal  cancers.  Pelvic exam and Pap test. This may be done every 3 years starting at age 76. Starting at age 89, this may be done every 5 years if you have a Pap test in combination with an HPV test. Other tests  Sexually transmitted disease (STD) testing.  Bone density scan. This is done to screen for osteoporosis. You may have this scan if you are at high risk for osteoporosis. Follow these instructions at home: Eating and drinking  Eat a diet that includes fresh fruits and vegetables, whole grains, lean protein, and low-fat dairy.  Take vitamin and mineral supplements as recommended by your health care provider.  Do not drink alcohol if: ? Your health care provider tells you not to drink. ? You are pregnant, may be pregnant, or are planning to become pregnant.  If you drink alcohol: ? Limit how much you have to 0-1 drink a day. ? Be aware of how much alcohol is in your drink. In the [place] one drink equals one 12 oz bottle of beer (355 mL), one 5 oz glass of wine (148 mL), or one 1 oz glass of hard liquor (44 mL). Lifestyle  Take daily care of your teeth and gums.  Stay active. Exercise for at least 30 minutes on 5 or more days each week.  Do not use any products that contain nicotine or tobacco, such as cigarettes, e-cigarettes, and chewing tobacco. If you need help quitting, ask your health care provider.  If you are sexually active, practice safe sex. Use a condom or other form of birth control (contraception) in order to prevent pregnancy and STIs (sexually transmitted infections).  If told by your health care provider, take low-dose aspirin daily starting at age 37. What's next?  Visit your health care provider once a year for a well check visit.  Ask your health care provider how often you should have your eyes and teeth checked.  Stay up to date on all vaccines. This information is not intended to replace advice given to you by your health care provider. Make sure you  discuss any questions you have with your health care provider. Document Revised: 03/11/2018 Document Reviewed: 03/11/2018 Elsevier Patient Education  2020 [employer].

## 2020-05-29 NOTE — Progress Notes (Signed)
Subjective:     Jasmine Hinton is a 49 y.o. female and is here for a comprehensive physical exam. The patient reports chronic problem with cts and shoulder pain-- she would like pt   HYPERTENSION   Blood pressure range-not checking   Chest pain- no      Dyspnea- no Lightheadedness- no   Edema- no  Other side effects - no   Medication compliance: good Low salt diet- yes    DIABETES    Blood Sugar ranges-not checking   Polyuria- no New Visual problems- no  Hypoglycemic symptoms- no  Other side effects-no Medication compliance - good Last eye exam- due Foot exam- today   HYPERLIPIDEMIA  Medication compliance- good RUQ pain- no  Muscle aches- no Other side effects-no     Social History   Socioeconomic History  . Marital status: Divorced    Spouse name: Not on file  . Number of children: Not on file  . Years of education: Not on file  . Highest education level: Not on file  Occupational History  . 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 Smoking status: Never Smoker  . Smokeless tobacco: Never Used  Vaping Use  . Vaping Use: Never used  Substance and Sexual Activity  . Alcohol use: Yes    Comment: rare  . Drug use: No  . Sexual activity: Yes    Partners: Male    Birth control/protection: I.U.D.    Comment: one partner currently  Other Topics Concern  . Not on file  Social History Narrative  . Not on file   Social Determinants of Health   Financial Resource Strain:   . Difficulty of Paying Living Expenses: Not on file  Food Insecurity:   . Worried About Charity fundraiser in the Last Year: Not on file  . Ran Out of Food in the Last Year: Not on file  Transportation Needs:   . Lack of Transportation (Medical): Not on file  . Lack of Transportation (Non-Medical): Not on file  Physical Activity:   . Days of Exercise per Week: Not on file  . Minutes of Exercise per Session: Not on file  Stress:   . Feeling of Stress : Not on file  Social  Connections:   . Frequency of Communication with Friends and Family: Not on file  . Frequency of Social Gatherings with Friends and Family: Not on file  . Attends Religious Services: Not on file  . Active Member of Clubs or Organizations: Not on file  . Attends Archivist Meetings: Not on file  . Marital Status: Not on file  Intimate Partner Violence:   . Fear of Current or Ex-Partner: Not on file  . Emotionally Abused: Not on file  . Physically Abused: Not on file  . Sexually Abused: Not on file   Health Maintenance  Topic Date Due  . Hepatitis C Screening  Never done  . COVID-19 Vaccine (1) Never done  . TETANUS/TDAP  09/08/2018  . OPHTHALMOLOGY EXAM  11/06/2018  . FOOT EXAM  06/16/2019  . HEMOGLOBIN A1C  08/05/2019  . PAP SMEAR-Modifier  07/14/2022  . INFLUENZA VACCINE  Completed  . PNEUMOCOCCAL POLYSACCHARIDE VACCINE AGE 61-64 HIGH RISK  Completed  . HIV Screening  Completed    The following portions of the patient's history were reviewed and updated as appropriate:  She  has a past medical history of Anxiety, Depression, Diabetes mellitus, and Hypertension. She does not have any pertinent problems on file.  She  has a past surgical history that includes Tonsillectomy. Her family history includes Colon cancer (age of onset: 44) in her maternal grandfather; Depression in her unknown relative; Diabetes in her unknown relative; Hypertension in her unknown relative. She  reports that she has never smoked. She has never used smokeless tobacco. She reports current alcohol use. She reports that she does not use drugs. She has a current medication list which includes the following prescription(s): albuterol, atorvastatin, atorvastatin, citalopram, clonazepam, fluticasone, glucose blood, insulin pen needle, levonorgestrel, lisinopril-hydrochlorothiazide, montelukast, onetouch delica lancets fine, and victoza. Current Outpatient Medications on File Prior to Visit  Medication Sig  Dispense Refill  . albuterol (PROAIR HFA) 108 (90 Base) MCG/ACT inhaler Inhale 2 puffs into the lungs every 6 (six) hours as needed for wheezing or shortness of breath. 1 Inhaler 5  . atorvastatin (LIPITOR) 20 MG tablet TAKE 1 TABLET BY MOUTH EVERY DAY 90 tablet 0  . atorvastatin (LIPITOR) 20 MG tablet TAKE 1 TABLET BY MOUTH EVERY DAY 90 tablet 0  . citalopram (CELEXA) 20 MG tablet TAKE 1 TABLET BY MOUTH EVERY DAY 90 tablet 0  . clonazePAM (KLONOPIN) 1 MG tablet 1 po tid prn 30 tablet 1  . fluticasone (FLONASE) 50 MCG/ACT nasal spray Place 2 sprays into both nostrils daily. 16 g 5  . glucose blood (ONETOUCH VERIO) test strip Use as instructed twice a day.  E11.9 100 each 1  . Insulin Pen Needle (BD PEN NEEDLE NANO 2ND GEN) 32G X 4 MM MISC Use as directed with Victoza once a day 100 each 3  . levonorgestrel (MIRENA) 20 MCG/24HR IUD 1 each by Intrauterine route once.      Marland Kitchen lisinopril-hydrochlorothiazide (ZESTORETIC) 10-12.5 MG tablet TAKE 1 TABLET BY MOUTH DAILY. NEEDS OV BEFORE ANYMORE REFILLS 90 tablet 1  . ONETOUCH DELICA LANCETS FINE MISC Use as instructed twice a day.  E11.9 100 each 1  . VICTOZA 18 MG/3ML SOPN INJECT 1.8 MG UNDER THE SKIN ONCE DAILY 3 mL 1   No current facility-administered medications on file prior to visit.   She has No Known Allergies..  Review of Systems Review of Systems  Constitutional: Negative for activity change, appetite change and fatigue.  HENT: Negative for hearing loss, congestion, tinnitus and ear discharge.  dentist q32m Eyes: Negative for visual disturbance (see optho q1y -- vision corrected to 20/20 with glasses).  Respiratory: Negative for cough, chest tightness and shortness of breath.   Cardiovascular: Negative for chest pain, palpitations and leg swelling.  Gastrointestinal: Negative for abdominal pain, diarrhea, constipation and abdominal distention.  Genitourinary: Negative for urgency, frequency, decreased urine volume and difficulty urinating.   Musculoskeletal: Negative for back pain, arthralgias and gait problem.  Skin: Negative for color change, pallor and rash.  Neurological: Negative for dizziness, light-headedness, numbness and headaches.  Hematological: Negative for adenopathy. Does not bruise/bleed easily.  Psychiatric/Behavioral: Negative for suicidal ideas, confusion, sleep disturbance, self-injury, dysphoric mood, decreased concentration and agitation.       Objective:    BP 120/60 (BP Location: Right Arm, Patient Position: Sitting, Cuff Size: Large)   Pulse 100   Temp 98.9 F (37.2 C) (Oral)   Resp 18   Ht 5\' 5"  (1.651 m)   Wt 222 lb 3.2 oz (100.8 kg)   SpO2 97%   BMI 36.98 kg/m  General appearance: alert, cooperative, appears stated age and no distress Head: Normocephalic, without obvious abnormality, atraumatic Eyes: negative findings: lids and lashes normal, conjunctivae and sclerae normal and pupils  equal, round, reactive to light and accomodation Ears: normal TM's and external ear canals both ears Neck: no adenopathy, no carotid bruit, no JVD, supple, symmetrical, trachea midline and thyroid not enlarged, symmetric, no tenderness/mass/nodules Back: symmetric, no curvature. ROM normal. No CVA tenderness. Lungs: clear to auscultation bilaterally Breasts: normal appearance, no masses or tenderness Heart: regular rate and rhythm, S1, S2 normal, no murmur, click, rub or gallop Abdomen: soft, non-tender; bowel sounds normal; no masses,  no organomegaly Pelvic: deferred Extremities: extremities normal, atraumatic, no cyanosis or edema Pulses: 2+ and symmetric Skin: Skin color, texture, turgor normal. No rashes or lesions Lymph nodes: Cervical, supraclavicular, and axillary nodes normal. Neurologic: Alert and oriented X 3, normal strength and tone. Normal symmetric reflexes. Normal coordination and gait    Assessment:    Healthy female exam.      Plan:    ghm utd Check labs See After Visit Summary  for Counseling Recommendations    1. Mild intermittent asthma without complication Stab;e - montelukast (SINGULAIR) 10 MG tablet; TAKE 1 TABLET AT BEDTIME. PT NEEDS OV FOR FURTHER REFILLS  Dispense: 90 tablet; Refill: 3  2. Preventative health care See above  - Lipid panel - Hemoglobin A1c - Comprehensive metabolic panel - Microalbumin / creatinine urine ratio - TSH - CBC with Differential/Platelet - Hepatitis C antibody  3. Uncontrolled type 2 diabetes mellitus with hyperglycemia (HCC) Check labs  - Hemoglobin A1c - Comprehensive metabolic panel - Microalbumin / creatinine urine ratio  4. Hyperlipidemia associated with type 2 diabetes mellitus ([place]) Encouraged heart healthy diet, increase exercise, avoid trans fats, consider a krill oil cap daily - Lipid panel - Comprehensive metabolic panel - Microalbumin / creatinine urine ratio  5. Primary hypertension Well controlled, no changes to meds. Encouraged heart healthy diet such as the DASH diet and exercise as tolerated.  - Comprehensive metabolic panel - Microalbumin / creatinine urine ratio - CBC with Differential/Platelet  6. Need for hepatitis C screening test   - Hepatitis C antibody  7. Need for pneumococcal vaccination   - Pneumococcal polysaccharide vaccine 23-valent greater than or equal to 2yo subcutaneous/IM  8. Chronic right shoulder pain Refer to pt   9. Carpal tunnel syndrome of right wrist Pt would like PT referral  - Ambulatory referral to Physical Therapy

## 2020-05-29 NOTE — Progress Notes (Signed)
° °  Covid-19 Vaccination Clinic  Name:  BLINDA TUREK    MRN: [id number] DOB: [birth year]  05/29/2020  Ms. Zarazua was observed post Covid-19 immunization for 15 minutes without incident. She was provided with Vaccine Information Sheet and instruction to access the V-Safe system.   Ms. Orengo was instructed to call 911 with any severe reactions post vaccine:  Difficulty breathing   Swelling of face and throat   A fast heartbeat   A bad rash all over body   Dizziness and weakness   Immunizations Administered    No immunizations on file.

## 2020-05-30 LAB — CBC WITH DIFFERENTIAL/PLATELET
Basophils Absolute: 0 10*3/uL (ref 0.0–0.1)
Basophils Relative: 1 % (ref 0.0–3.0)
Eosinophils Absolute: 0.1 10*3/uL (ref 0.0–0.7)
Eosinophils Relative: 2.3 % (ref 0.0–5.0)
HCT: 42 % (ref 36.0–46.0)
Hemoglobin: 14 g/dL (ref 12.0–15.0)
Lymphocytes Relative: 48.3 % — ABNORMAL HIGH (ref 12.0–46.0)
Lymphs Abs: 1.9 10*3/uL (ref 0.7–4.0)
MCHC: 33.4 g/dL (ref 30.0–36.0)
MCV: 87.3 fl (ref 78.0–100.0)
Monocytes Absolute: 0.3 10*3/uL (ref 0.1–1.0)
Monocytes Relative: 8.5 % (ref 3.0–12.0)
Neutro Abs: 1.6 10*3/uL (ref 1.4–7.7)
Neutrophils Relative %: 39.9 % — ABNORMAL LOW (ref 43.0–77.0)
Platelets: 302 10*3/uL (ref 150.0–400.0)
RBC: 4.81 Mil/uL (ref 3.87–5.11)
RDW: 14.1 % (ref 11.5–15.5)
WBC: 4 10*3/uL (ref 4.0–10.5)

## 2020-05-30 LAB — MICROALBUMIN / CREATININE URINE RATIO
Creatinine,U: 66.1 mg/dL
Microalb Creat Ratio: 1.1 mg/g (ref 0.0–30.0)
Microalb, Ur: <0.7 mg/dL (ref 0.0–1.9)

## 2020-05-30 LAB — COMPREHENSIVE METABOLIC PANEL
ALT: 25 U/L (ref 0–35)
AST: 14 U/L (ref 0–37)
Albumin: 4.3 g/dL (ref 3.5–5.2)
Alkaline Phosphatase: 76 U/L (ref 39–117)
BUN: 11 mg/dL (ref 6–23)
CO2: 29 mEq/L (ref 19–32)
Calcium: 9.5 mg/dL (ref 8.4–10.5)
Chloride: 99 mEq/L (ref 96–112)
Creatinine, Ser: 0.74 mg/dL (ref 0.40–1.20)
GFR: 96.68 mL/min (ref >60.00)
Glucose, Bld: 208 mg/dL — ABNORMAL HIGH (ref 70–99)
Potassium: 3.9 mEq/L (ref 3.5–5.1)
Sodium: 136 mEq/L (ref 135–145)
Total Bilirubin: 0.4 mg/dL (ref 0.2–1.2)
Total Protein: 7.2 g/dL (ref 6.0–8.3)

## 2020-05-30 LAB — LIPID PANEL
Cholesterol: 134 mg/dL (ref 0–200)
HDL: 44.5 mg/dL (ref >39.00)
LDL Cholesterol: 72 mg/dL (ref 0–99)
NonHDL: 89.79
Total CHOL/HDL Ratio: 3
Triglycerides: 87 mg/dL (ref 0.0–149.0)
VLDL: 17.4 mg/dL (ref 0.0–40.0)

## 2020-05-30 LAB — TSH: TSH: 1.34 u[IU]/mL (ref 0.35–4.50)

## 2020-05-30 LAB — HEMOGLOBIN A1C: Hgb A1c MFr Bld: 9.7 % — ABNORMAL HIGH (ref 4.6–6.5)

## 2020-05-30 LAB — HEPATITIS C ANTIBODY
Hepatitis C Ab: NONREACTIVE
SIGNAL TO CUT-OFF: 0.01 (ref <1.00)

## 2020-06-04 ENCOUNTER — Other Ambulatory Visit: Payer: Self-pay | Admitting: Family Medicine

## 2020-06-04 DIAGNOSIS — E1165 Type 2 diabetes mellitus with hyperglycemia: Secondary | ICD-10-CM

## 2020-06-04 DIAGNOSIS — I10 Essential (primary) hypertension: Secondary | ICD-10-CM

## 2020-06-05 ENCOUNTER — Other Ambulatory Visit: Payer: Self-pay

## 2020-06-05 MED ORDER — METFORMIN HCL 1000 MG PO TABS: 1000.0000 mg | ORAL_TABLET | Freq: Two times a day (BID) | ORAL | 2 refills | Status: DC

## 2020-06-13 ENCOUNTER — Other Ambulatory Visit: Payer: Self-pay | Admitting: Family Medicine

## 2020-06-13 DIAGNOSIS — E1165 Type 2 diabetes mellitus with hyperglycemia: Secondary | ICD-10-CM

## 2020-06-14 ENCOUNTER — Ambulatory Visit (INDEPENDENT_AMBULATORY_CARE_PROVIDER_SITE_OTHER): Payer: Commercial Managed Care - PPO | Admitting: Psychology

## 2020-06-14 DIAGNOSIS — F4322 Adjustment disorder with anxiety: Secondary | ICD-10-CM | POA: Diagnosis not present

## 2020-07-14 ENCOUNTER — Other Ambulatory Visit: Payer: Self-pay | Admitting: Family Medicine

## 2020-07-14 DIAGNOSIS — F418 Other specified anxiety disorders: Secondary | ICD-10-CM

## 2020-07-19 ENCOUNTER — Ambulatory Visit (INDEPENDENT_AMBULATORY_CARE_PROVIDER_SITE_OTHER): Payer: Commercial Managed Care - PPO | Admitting: Psychology

## 2020-07-19 DIAGNOSIS — F4322 Adjustment disorder with anxiety: Secondary | ICD-10-CM

## 2020-08-16 ENCOUNTER — Ambulatory Visit: Payer: Commercial Managed Care - PPO | Admitting: Psychology

## 2020-09-13 ENCOUNTER — Ambulatory Visit (INDEPENDENT_AMBULATORY_CARE_PROVIDER_SITE_OTHER): Payer: Commercial Managed Care - PPO | Admitting: Psychology

## 2020-09-13 DIAGNOSIS — F4322 Adjustment disorder with anxiety: Secondary | ICD-10-CM

## 2020-10-05 ENCOUNTER — Other Ambulatory Visit: Payer: Self-pay | Admitting: Family Medicine

## 2020-10-08 ENCOUNTER — Other Ambulatory Visit: Payer: Self-pay | Admitting: Family Medicine

## 2020-10-11 ENCOUNTER — Ambulatory Visit: Payer: Commercial Managed Care - PPO | Admitting: Psychology

## 2020-11-29 ENCOUNTER — Encounter: Payer: Commercial Managed Care - PPO | Admitting: Family Medicine

## 2020-12-08 ENCOUNTER — Other Ambulatory Visit: Payer: Self-pay | Admitting: Family Medicine

## 2021-01-31 ENCOUNTER — Other Ambulatory Visit: Payer: Self-pay | Admitting: Family Medicine

## 2021-02-11 ENCOUNTER — Telehealth: Payer: Self-pay

## 2021-02-11 NOTE — Telephone Encounter (Signed)
Jasmine Hinton (Key: Jasmine Hinton) Rx #: [id number] Victoza '18MG'$ Fayne Mediate pen-injectors   Form [employer] Electronic PA Form [phone] NCPDP)

## 2021-02-11 NOTE — Telephone Encounter (Signed)
PA was denied, UMR requires pt to have tried and failed 3 of the preferred agents:  BYETTA INJECT/PEN 5MCG . BYETTA INJECT/PEN 10MCG . TRULICITY SD PEN [id number] 4'S 0. 0.'75MG'$  TRULICITY SD PEN [id number] 4'S 1. 1.'5MG'$  BYDUREON BCISE AUTOINJECTR 0. '2MG'$  OZEMPIC 0.25 OR 0.5 MG/DOSE P '2MG'$ [id number] TRULICITY SD PEN [id number] 4'S '3MG'$  TRULICITY SD PEN [id number] 4'S 4.'5MG'$ 

## 2021-02-12 NOTE — Telephone Encounter (Signed)
Pt says she will call UMR and "push back" a little since she is established on the Victoza and likes it. She will give [place] a call when to move forward with the Rx for Ozempic.

## 2021-03-14 ENCOUNTER — Other Ambulatory Visit: Payer: Self-pay | Admitting: Family Medicine

## 2021-03-14 DIAGNOSIS — F418 Other specified anxiety disorders: Secondary | ICD-10-CM

## 2021-03-23 ENCOUNTER — Other Ambulatory Visit: Payer: Self-pay | Admitting: Family Medicine

## 2021-05-29 ENCOUNTER — Other Ambulatory Visit: Payer: Self-pay | Admitting: Family Medicine

## 2021-05-29 DIAGNOSIS — J4520 Mild intermittent asthma, uncomplicated: Secondary | ICD-10-CM

## 2021-05-30 ENCOUNTER — Other Ambulatory Visit: Payer: Self-pay

## 2021-05-31 ENCOUNTER — Ambulatory Visit (INDEPENDENT_AMBULATORY_CARE_PROVIDER_SITE_OTHER): Payer: 59 | Admitting: Family Medicine

## 2021-05-31 ENCOUNTER — Telehealth: Payer: Self-pay | Admitting: Family Medicine

## 2021-05-31 ENCOUNTER — Encounter: Payer: Self-pay | Admitting: Family Medicine

## 2021-05-31 ENCOUNTER — Ambulatory Visit: Payer: 59 | Attending: Internal Medicine

## 2021-05-31 VITALS — BP 108/88 | HR 91 | Temp 99.2°F | Resp 18 | Ht 65.0 in | Wt 211.8 lb

## 2021-05-31 DIAGNOSIS — Z1211 Encounter for screening for malignant neoplasm of colon: Secondary | ICD-10-CM | POA: Diagnosis not present

## 2021-05-31 DIAGNOSIS — E1169 Type 2 diabetes mellitus with other specified complication: Secondary | ICD-10-CM

## 2021-05-31 DIAGNOSIS — I10 Essential (primary) hypertension: Secondary | ICD-10-CM | POA: Diagnosis not present

## 2021-05-31 DIAGNOSIS — Z23 Encounter for immunization: Secondary | ICD-10-CM

## 2021-05-31 DIAGNOSIS — E785 Hyperlipidemia, unspecified: Secondary | ICD-10-CM

## 2021-05-31 DIAGNOSIS — E1165 Type 2 diabetes mellitus with hyperglycemia: Secondary | ICD-10-CM

## 2021-05-31 MED ORDER — OZEMPIC (0.25 OR 0.5 MG/DOSE) 2 MG/1.5ML ~~LOC~~ SOPN: PEN_INJECTOR | SUBCUTANEOUS | 3 refills | Status: DC

## 2021-05-31 NOTE — Telephone Encounter (Signed)
Patient would like to know if there is a coupon for her ozempic since the price for it is $800. She states she found a coupon that brought it down around 500 but it still is too expensive. Please advice.

## 2021-05-31 NOTE — Assessment & Plan Note (Signed)
Well controlled, no changes to meds. Encouraged heart healthy diet such as the DASH diet and exercise as tolerated.  °

## 2021-05-31 NOTE — Assessment & Plan Note (Signed)
Ins will no longer cover victoza-- switch to ozempic

## 2021-05-31 NOTE — Progress Notes (Signed)
Subjective:   By signing my name below, I, Jasmine Hinton, attest that this documentation has been prepared under the direction and in the presence of Jasmine Held, DO. 05/31/2021    Patient ID: Jasmine Hinton, female    DOB: 1970/12/20, 50 y.o.   MRN: [id number]  Chief Complaint  Patient presents with   Annual Exam    Pt states not fasting     HPI Patient is in today for a comprehensive physical exam.  She would like a referral for a colonoscopy.   She had the flu 2 weeks ago and still has a persistent cough. She had fatigue and minimal body aches. Tested negative for Covid-19.  She mentions that since she left her old job, she is less stressed and the numbness from her fingers have almost disappeared.   She would like to switch her diabetes and weight loss medication from Victoza to ozempic because her insurance refuses to pay for it. She has lost some weight since she started but has not taken the medication for a while because it is too expensive.  Wt Readings from Last 3 Encounters:  05/31/21 211 lb 12.8 oz (96.1 kg)  05/29/20 222 lb 3.2 oz (100.8 kg)  06/15/18 225 lb 6.4 oz (102.2 kg)     She denies fever, hearing loss, ear pain,congestion, sinus pain, sore throat, eye pain, chest pain, palpitations, shortness of breath, wheezing, nausea. vomiting, diarrhea, constipation, blood in stool, dysuria,frequency, hematuria and headaches.   She will receive the tetanus, Covid-19 booster and flu vaccines today.  She has an upcoming eye appointment in December 2022.  She recently got married. She works at [employer].   Past Medical History:  Diagnosis Date   Anxiety    Depression    Diabetes mellitus    Hypertension     Past Surgical History:  Procedure Laterality Date   TONSILLECTOMY      Family History  Problem Relation Age of Onset   Hypertension Mother    Thyroid disease Mother    Diabetes Mother    Diabetes Father    COPD Father    Colon  cancer Maternal Grandfather 72   Diabetes Other    Hypertension Other    Depression Other     Social History   Socioeconomic History   Marital status: Married    Spouse name: Jasmine Hinton   Number of children: Not on file   Years of education: Not on file   Highest education level: Not on file  Occupational History   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    Comment: one partner currently  Other Topics Concern   Not on file  Social History Narrative   Not on file   Social Determinants of Health   Financial Resource Strain: Not on file  Food Insecurity: Not on file  Transportation Needs: Not on file  Physical Activity: Not on file  Stress: Not on file  Social Connections: Not on file  Intimate Partner Violence: Not on file    Outpatient Medications Prior to Visit  Medication Sig Dispense Refill   albuterol (PROAIR HFA) 108 (90 Base) MCG/ACT inhaler Inhale 2 puffs  into the lungs every 6 (six) hours as needed for wheezing or shortness of breath. 1 Inhaler 5   atorvastatin (LIPITOR) 20 MG tablet Take 1 tablet by mouth once daily 90 tablet 0   citalopram (CELEXA) 20 MG tablet Take 1 tablet by mouth once daily 90 tablet 0   clonazePAM (KLONOPIN) 1 MG tablet 1 po tid prn 30 tablet 1   fluticasone (FLONASE) 50 MCG/ACT nasal spray Place 2 sprays into both nostrils daily. 16 g 5   glucose blood (ONETOUCH VERIO) test strip Use as instructed twice a day.  E11.9 100 each 1   Insulin Pen Needle (BD PEN NEEDLE NANO 2ND GEN) 32G X 4 MM MISC Use as directed with Victoza once a day 100 each 3   levonorgestrel (MIRENA) 20 MCG/24HR IUD 1 each by Intrauterine route once.       lisinopril-hydrochlorothiazide (ZESTORETIC) 10-12.5 MG  tablet Take 1 tablet by mouth once daily 90 tablet 0   metFORMIN (GLUCOPHAGE) 1000 MG tablet TAKE 1 TABLET BY MOUTH TWICE DAILY WITH MEALS 166 tablet 0   montelukast (SINGULAIR) 10 MG tablet TAKE 1 TABLET BY MOUTH AT BEDTIME **Please Keep appointment** 90 tablet 0   ONETOUCH DELICA LANCETS FINE MISC Use as instructed twice a day.  E11.9 100 each 1   liraglutide (VICTOZA) 18 MG/3ML SOPN Inject 1.8 mg into the skin daily. 9 mL 3   No facility-administered medications prior to visit.    No Known Allergies  Review of Systems  Constitutional:  Negative for fever and malaise/fatigue.  HENT:  Negative for congestion, ear pain, hearing loss, sinus pain and sore throat.   Eyes:  Negative for blurred vision and pain.  Respiratory:  Positive for cough. Negative for sputum production, shortness of breath and wheezing.   Cardiovascular:  Negative for chest pain, palpitations and leg swelling.  Gastrointestinal:  Negative for blood in stool, constipation, diarrhea, nausea and vomiting.  Genitourinary:  Negative for dysuria, frequency, hematuria and urgency.  Musculoskeletal:  Negative for back pain, falls and myalgias.  Skin:  Negative for rash.  Neurological:  Negative for dizziness, sensory change, loss of consciousness, weakness and headaches.  Endo/Heme/Allergies:  Negative for environmental allergies. Does not bruise/bleed easily.  Psychiatric/Behavioral:  Negative for depression and suicidal ideas. The patient is not nervous/anxious and does not have insomnia.       Objective:    Physical Exam Vitals and nursing note reviewed.  Constitutional:      General: She is not in acute distress.    Appearance: Normal appearance. She is not ill-appearing.  HENT:     Head: Normocephalic and atraumatic.     Right Ear: Tympanic membrane, ear canal and external ear normal.     Left Ear: Tympanic membrane, ear canal and external ear normal.  Eyes:     Extraocular Movements: Extraocular movements  intact.     Pupils: Pupils are equal, round, and reactive to light.  Cardiovascular:     Rate and Rhythm: Normal rate and regular rhythm.     Pulses: Normal pulses.     Heart sounds: Normal heart sounds. No murmur heard.   No gallop.  Pulmonary:     Effort: Pulmonary effort is normal. No respiratory distress.     Breath sounds: Normal breath sounds. No wheezing, rhonchi or rales.  Abdominal:     General: Bowel sounds are normal. There is no distension.     Palpations: Abdomen is soft. There is no mass.  Tenderness: There is no abdominal tenderness. There is no guarding or rebound.     Hernia: No hernia is present.  Musculoskeletal:     Cervical back: Normal range of motion and neck supple.  Lymphadenopathy:     Cervical: No cervical adenopathy.  Skin:    General: Skin is warm and dry.  Neurological:     Mental Status: She is alert and oriented to person, place, and time.  Psychiatric:        Behavior: Behavior normal.    BP 108/88 (BP Location: Left Arm, Patient Position: Sitting, Cuff Size: Normal)   Pulse 91   Temp 99.2 F (37.3 C) (Oral)   Resp 18   Ht 5\' 5"  (1.651 m)   Wt 211 lb 12.8 oz (96.1 kg)   SpO2 99%   BMI 35.25 kg/m  Wt Readings from Last 3 Encounters:  05/31/21 211 lb 12.8 oz (96.1 kg)  05/29/20 222 lb 3.2 oz (100.8 kg)  06/15/18 225 lb 6.4 oz (102.2 kg)    Diabetic Foot Exam - Simple   Simple Foot Form Diabetic Foot exam was performed with the following findings: Yes 05/31/2021 11:00 AM  Visual Inspection No deformities, no ulcerations, no other skin breakdown bilaterally: Yes Sensation Testing Intact to touch and monofilament testing bilaterally: Yes Pulse Check Posterior Tibialis and Dorsalis pulse intact bilaterally: Yes Comments    Lab Results  Component Value Date   WBC 4.0 05/29/2020   HGB 14.0 05/29/2020   HCT 42.0 05/29/2020   PLT 302.0 05/29/2020   GLUCOSE 208 (H) 05/29/2020   CHOL 134 05/29/2020   TRIG 87.0 05/29/2020   HDL  44.50 05/29/2020   LDLCALC 72 05/29/2020   ALT 25 05/29/2020   AST 14 05/29/2020   NA 136 05/29/2020   K 3.9 05/29/2020   CL 99 05/29/2020   CREATININE 0.74 05/29/2020   BUN 11 05/29/2020   CO2 29 05/29/2020   TSH 1.34 05/29/2020   HGBA1C 9.7 (H) 05/29/2020   MICROALBUR <0.7 05/29/2020    Lab Results  Component Value Date   TSH 1.34 05/29/2020   Lab Results  Component Value Date   WBC 4.0 05/29/2020   HGB 14.0 05/29/2020   HCT 42.0 05/29/2020   MCV 87.3 05/29/2020   PLT 302.0 05/29/2020   Lab Results  Component Value Date   NA 136 05/29/2020   K 3.9 05/29/2020   CO2 29 05/29/2020   GLUCOSE 208 (H) 05/29/2020   BUN 11 05/29/2020   CREATININE 0.74 05/29/2020   BILITOT 0.4 05/29/2020   ALKPHOS 76 05/29/2020   AST 14 05/29/2020   ALT 25 05/29/2020   PROT 7.2 05/29/2020   ALBUMIN 4.3 05/29/2020   CALCIUM 9.5 05/29/2020   GFR 96.68 05/29/2020   Lab Results  Component Value Date   CHOL 134 05/29/2020   Lab Results  Component Value Date   HDL 44.50 05/29/2020   Lab Results  Component Value Date   LDLCALC 72 05/29/2020   Lab Results  Component Value Date   TRIG 87.0 05/29/2020   Lab Results  Component Value Date   CHOLHDL 3 05/29/2020   Lab Results  Component Value Date   HGBA1C 9.7 (H) 05/29/2020       Mammogram: Last checked on 08/30/2014. There was possible asymmetry in the right breast which warrants further evaluation. DUE Pap Smear: Last checked on 07/15/2019. Results were normal. Repeat in 3 years.   Assessment & Plan:   Problem List Items Addressed This  Visit       Unprioritized   Diabetes mellitus, type II ([place])    Ins will no longer cover victoza-- switch to ozempic      Relevant Medications   Semaglutide,0.25 or 0.5MG /DOS, (OZEMPIC, 0.25 OR 0.5 MG/DOSE,) 2 MG/1.5ML SOPN   Other Relevant Orders   Hemoglobin A1c   Microalbumin / creatinine urine ratio   Essential hypertension    Well controlled, no changes to meds. Encouraged  heart healthy diet such as the DASH diet and exercise as tolerated.       Hyperlipidemia LDL goal <70    Encourage heart healthy diet such as MIND or DASH diet, increase exercise, avoid trans fats, simple carbohydrates and processed foods, consider a krill or fish or flaxseed oil cap daily.       Other Visit Diagnoses     Primary hypertension    -  Primary   Relevant Orders   CBC with Differential/Platelet   Comprehensive metabolic panel   Hemoglobin A1c   Lipid panel   Microalbumin / creatinine urine ratio   Need for influenza vaccination       Relevant Orders   Flu Vaccine QUAD 68mo+IM (Fluarix, Fluzone & Alfiuria Quad PF) (Completed)   Need for Tdap vaccination       Relevant Orders   Tdap vaccine greater than or equal to 7yo IM (Completed)   Hyperlipidemia associated with type 2 diabetes mellitus ([place])       Relevant Medications   Semaglutide,0.25 or 0.5MG /DOS, (OZEMPIC, 0.25 OR 0.5 MG/DOSE,) 2 MG/1.5ML SOPN   Other Relevant Orders   CBC with Differential/Platelet   Comprehensive metabolic panel   Lipid panel   Colon cancer screening       Relevant Orders   Ambulatory referral to Gastroenterology       Meds ordered this encounter  Medications   Semaglutide,0.25 or 0.5MG /DOS, (OZEMPIC, 0.25 OR 0.5 MG/DOSE,) 2 MG/1.5ML SOPN    Sig: 0.25 mg weekly x4 then increase to 0.5 mg    Dispense:  1.5 mL    Refill:  3    I,Yuvin Bussiere R Lowne Chase,acting as a scribe for [employer], DO.,have documented all relevant documentation on the behalf of Jasmine Held, DO,as directed by  Jasmine Held, DO while in the presence of Jasmine Held, DO.   I, Jasmine Held, DO., personally preformed the services described in this documentation.  All medical record entries made by the scribe were at my direction and in my presence.  I have reviewed the chart and discharge instructions (if applicable) and agree that the record reflects my personal performance and  is accurate and complete. 05/31/2021

## 2021-05-31 NOTE — Patient Instructions (Signed)
Preventive Care 48-48 Years Old, Female Preventive care refers to lifestyle choices and visits with your health care provider that can promote health and wellness. Preventive care visits are also called wellness exams. What can I expect for my preventive care visit? Counseling Your health care provider may ask you questions about your: Medical history, including: Past medical problems. Family medical history. Pregnancy history. Current health, including: Menstrual cycle. Method of birth control. Emotional well-being. Home life and relationship well-being. Sexual activity and sexual health. Lifestyle, including: Alcohol, nicotine or tobacco, and drug use. Access to firearms. Diet, exercise, and sleep habits. Work and work Statistician. Sunscreen use. Safety issues such as seatbelt and bike helmet use. Physical exam Your health care provider will check your: Height and weight. These may be used to calculate your BMI (body mass index). BMI is a measurement that tells if you are at a healthy weight. Waist circumference. This measures the distance around your waistline. This measurement also tells if you are at a healthy weight and may help predict your risk of certain diseases, such as type 2 diabetes and high blood pressure. Heart rate and blood pressure. Body temperature. Skin for abnormal spots. What immunizations do I need? Vaccines are usually given at various ages, according to a schedule. Your health care provider will recommend vaccines for you based on your age, medical history, and lifestyle or other factors, such as travel or where you work. What tests do I need? Screening Your health care provider may recommend screening tests for certain conditions. This may include: Lipid and cholesterol levels. Diabetes screening. This is done by checking your blood sugar (glucose) after you have not eaten for a while (fasting). Pelvic exam and Pap test. Hepatitis B test. Hepatitis C  test. HIV (human immunodeficiency virus) test. STI (sexually transmitted infection) testing, if you are at risk. Lung cancer screening. Colorectal cancer screening. Mammogram. Talk with your health care provider about when you should start having regular mammograms. This may depend on whether you have a family history of breast cancer. BRCA-related cancer screening. This may be done if you have a family history of breast, ovarian, tubal, or peritoneal cancers. Bone density scan. This is done to screen for osteoporosis. Talk with your health care provider about your test results, treatment options, and if necessary, the need for more tests. Follow these instructions at home: Eating and drinking  Eat a diet that includes fresh fruits and vegetables, whole grains, lean protein, and low-fat dairy products. Take vitamin and mineral supplements as recommended by your health care provider. Do not drink alcohol if: Your health care provider tells you not to drink. You are pregnant, may be pregnant, or are planning to become pregnant. If you drink alcohol: Limit how much you have to 0-1 drink a day. Know how much alcohol is in your drink. In the [place] one drink equals one 12 oz bottle of beer (355 mL), one 5 oz glass of wine (148 mL), or one 1 oz glass of hard liquor (44 mL). Lifestyle Brush your teeth every morning and night with fluoride toothpaste. Floss one time each day. Exercise for at least 30 minutes 5 or more days each week. Do not use any products that contain nicotine or tobacco. These products include cigarettes, chewing tobacco, and vaping devices, such as e-cigarettes. If you need help quitting, ask your health care provider. Do not use drugs. If you are sexually active, practice safe sex. Use a condom or other form of protection to prevent  STIs. If you do not wish to become pregnant, use a form of birth control. If you plan to become pregnant, see your health care provider for a  prepregnancy visit. Take aspirin only as told by your health care provider. Make sure that you understand how much to take and what form to take. Work with your health care provider to find out whether it is safe and beneficial for you to take aspirin daily. Find healthy ways to manage stress, such as: Meditation, yoga, or listening to music. Journaling. Talking to a trusted person. Spending time with friends and family. Minimize exposure to UV radiation to reduce your risk of skin cancer. Safety Always wear your seat belt while driving or riding in a vehicle. Do not drive: If you have been drinking alcohol. Do not ride with someone who has been drinking. When you are tired or distracted. While texting. If you have been using any mind-altering substances or drugs. Wear a helmet and other protective equipment during sports activities. If you have firearms in your house, make sure you follow all gun safety procedures. Seek help if you have been physically or sexually abused. What's next? Visit your health care provider once a year for an annual wellness visit. Ask your health care provider how often you should have your eyes and teeth checked. Stay up to date on all vaccines. This information is not intended to replace advice given to you by your health care provider. Make sure you discuss any questions you have with your health care provider. Document Revised: 12/26/2020 Document Reviewed: 12/26/2020 Elsevier Patient Education  [employer].

## 2021-05-31 NOTE — Assessment & Plan Note (Signed)
Encourage heart healthy diet such as MIND or DASH diet, increase exercise, avoid trans fats, simple carbohydrates and processed foods, consider a krill or fish or flaxseed oil cap daily.  °

## 2021-06-05 NOTE — Telephone Encounter (Signed)
noted 

## 2021-06-05 NOTE — Telephone Encounter (Signed)
Spoke with patient. I advised patient of the savings card available online and [employer] patient assistance. Pt states she will also call her insurance to discuss other options.

## 2021-06-10 ENCOUNTER — Other Ambulatory Visit: Payer: Self-pay | Admitting: Family Medicine

## 2021-06-10 DIAGNOSIS — F418 Other specified anxiety disorders: Secondary | ICD-10-CM

## 2021-06-10 DIAGNOSIS — J4520 Mild intermittent asthma, uncomplicated: Secondary | ICD-10-CM

## 2021-06-12 ENCOUNTER — Telehealth: Payer: Self-pay | Admitting: *Deleted

## 2021-06-12 NOTE — Telephone Encounter (Signed)
Prior auth started via cover my meds.  Awaiting determination.  Key: [id number]

## 2021-06-12 NOTE — Telephone Encounter (Signed)
Drug not covered by patients benefit.  Looks like patient knew about this back in August.  Not sure why we got another request.

## 2021-06-15 ENCOUNTER — Other Ambulatory Visit (HOSPITAL_BASED_OUTPATIENT_CLINIC_OR_DEPARTMENT_OTHER): Payer: Self-pay

## 2021-06-15 MED ORDER — MODERNA COVID-19 BIVAL BOOSTER 50 MCG/0.5ML IM SUSP
INTRAMUSCULAR | 0 refills | Status: DC
  Filled 2021-06-15: qty 0.5, 1d supply, fill #0

## 2021-06-17 ENCOUNTER — Other Ambulatory Visit (HOSPITAL_BASED_OUTPATIENT_CLINIC_OR_DEPARTMENT_OTHER): Payer: Self-pay

## 2021-08-01 ENCOUNTER — Ambulatory Visit (INDEPENDENT_AMBULATORY_CARE_PROVIDER_SITE_OTHER): Payer: 59 | Admitting: Psychology

## 2021-08-01 DIAGNOSIS — F4323 Adjustment disorder with mixed anxiety and depressed mood: Secondary | ICD-10-CM | POA: Diagnosis not present

## 2021-08-01 NOTE — Progress Notes (Signed)
[facility] Counselor/Therapist Progress Note  Patient ID: Jasmine Hinton, MRN: [id number],    Date: 08/01/2021  Time Spent: 11:00am - 11:50am   50 minutes   Treatment Type: Individual Therapy  Reported Symptoms: sadness,  stress  Mental Status Exam: Appearance:  Casual     Behavior: Appropriate  Motor: Normal  Speech/Language:  Normal Rate  Affect: Appropriate  Mood: normal  Thought process: normal  Thought content:   WNL  Sensory/Perceptual disturbances:   WNL  Orientation: oriented to person, place, time/date, and situation  Attention: Good  Concentration: Good  Memory: WNL  Fund of knowledge:  Good  Insight:   Good  Judgment:  Good  Impulse Control: Good   Risk Assessment: Danger to Self:  No Self-injurious Behavior: No Danger to Others: No Duty to Warn:no Physical Aggression / Violence:No  Access to Firearms a concern: No  Gang Involvement:No   Subjective: Pt present for face-to-face individual therapy via Webex.   Pt consented to telehealth video session due to COVID-19 pandemic.    Location of pt: work Location of therapist: home office. Pt has a job at [employer] center and has been there for a year. Pt has been married for about a year and she states that is going well.   Pt's family and friends really like her husband.   Both pt and husband are employed and doing well with their jobs.   Pt decided to come back to therapy for "maintenance" and to have a place to talk.    Pt states she is dealing with stress.  She had some depression during the holidays.  Addressed what triggered feeling down for her.  She thinks the overwhelm of the holidays made her mood low.  Pt's job has some stress as well.  Her work schedule is better than her previous job and she has most weekends off.  Pt feels like she has better quality of life but there are still some stressors.   Worked on Child psychotherapist. Pt is spending a lot of time with her parents and  grandparents.   Pt and her husband are looking at houses to buy.  The process is stressful bc the cost of realestate is high. Pt talked about her relationship with her mother.  Her mother seems to be jealous of pt's relationship with her father.  Pt feels like her mother is emotionally manipulative at times.  Pt's mother has trouble making friends in her same socioeconomic class.  Pt feels like her mother needs to have more social connections and not rely only on pt.  Helped pt process her feelings and relationship dynamics.   Pt has some anxiety about her grandparents dying.  She worries about them at times.   Provided supportive counseling.     Interventions: Cognitive Behavioral Therapy and Insight-Oriented  Diagnosis:  F43.23  Plan: Reviewed pt's treatment plan for annual update.  Pt participated in setting treatment goals.  She wants a safe place to talk about issues and improve coping skills.   Plan to meet with pt monthly.    Treatment Plan Client Abilities/Strengths  Pt is bright, engaging, and motivated for therapy.  Client Treatment Preferences  Individual therapy.  Client Statement of Needs  Improve copings skills and have a safe place to talk about issues.   Symptoms  Depressed or irritable mood. Excessive and/or unrealistic worry that is difficult to control occurring more days than not for at least 6 months about a number of events  or activities.  Problems Addressed  Unipolar Depression, Anxiety Goals 1. Alleviate depressive symptoms and return to previous level of effective functioning. 2. Appropriately grieve the loss in order to normalize mood and to return to previously adaptive level of functioning. Objective Learn and implement behavioral strategies to overcome depression. Target Date: 2022-08-01 Frequency: monthly  Progress: 30 Modality: individual  Related Interventions Assist the client in developing skills that increase the likelihood of deriving pleasure from  behavioral activation (e.g., assertiveness skills, developing an exercise plan, less internal/more external focus, increased social involvement); reinforce success. Engage the client in "behavioral activation," increasing his/her activity level and contact with sources of reward, while identifying processes that inhibit activation. use behavioral techniques such as instruction, rehearsal, role-playing, role reversal, as needed, to facilitate activity in the client's daily life; reinforce success. 3. Develop healthy interpersonal relationships that lead to the alleviation and help prevent the relapse of depression. 4. Develop healthy thinking patterns and beliefs about self, others, and the world that lead to the alleviation and help prevent the relapse of depression. 5. Enhance ability to effectively cope with the full variety of life's worries and anxieties. 6. Learn and implement coping skills that result in a reduction of anxiety and worry, and improved daily functioning. Objective Learn and implement problem-solving strategies for realistically addressing worries. Target Date: 2022-08-01 Frequency: monthly  Progress: 30 Modality: individual  Related Interventions Assign the client a homework exercise in which he/she problem-solves a current problem (see Mastery of Your Anxiety and Worry: Workbook by Adora Fridge and Eliot Ford or Generalized Anxiety Disorder by Eather Colas, and Eliot Ford); review, reinforce success, and provide corrective feedback toward improvement. Teach the client problem-solving strategies involving specifically defining a problem, generating options for addressing it, evaluating the pros and cons of each option, selecting and implementing an optional action, and reevaluating and refining the action. Objective Learn and implement calming skills to reduce overall anxiety and manage anxiety symptoms. Target Date: 2022-08-01 Frequency: monthly  Progress: 30 Modality: individual  Related  Interventions Assign the client to read about progressive muscle relaxation and other calming strategies in relevant books or treatment manuals (e.g., Progressive Relaxation Training by Leroy Kennedy; Mastery of Your Anxiety and Worry: Workbook by Beckie Busing). Assign the client homework each session in which he/she practices relaxation exercises daily, gradually applying them progressively from non-anxiety-provoking to anxiety-provoking situations; review and reinforce success while providing corrective feedback toward improvement. Teach the client calming/relaxation skills (e.g., applied relaxation, progressive muscle relaxation, cue controlled relaxation; mindful breathing; biofeedback) and how to discriminate better between relaxation and tension; teach the client how to apply these skills to his/her daily life. 7. Recognize, accept, and cope with feelings of depression. 8. Reduce overall frequency, intensity, and duration of the anxiety so that daily functioning is not impaired. 9. Resolve the core conflict that is the source of anxiety. 10. Stabilize anxiety level while increasing ability to function on a daily basis. Diagnosis Axis none F43.21 (Adjustment Disorder, With depressed mood)  Adjustment Disorder, With Depressed Mood   Axis none F43.22 (Adjustment Disorder, With anxiety) Adjustment Disorder, With Anxiety   Conditions For Discharge Achievement of treatment goals and objectives   Clint Bolder, LCSW

## 2021-08-01 NOTE — Progress Notes (Signed)
[facility] Counselor Initial Adult Exam  Name: MIREILLE LACOMBE Date: 08/01/2021 MRN: [id number] DOB: [birth year] PCP: Ann Held, DO  Time spent: 11:00am-11:50am  49 minutes  Guardian/Payee:  [name] requested: No   Reason for Visit /Presenting Problem: Subjective:  Pt present for face-to-face initial assessment update via Webex.   Pt consented to telehealth video session due to COVID-19 pandemic.    Location of pt: work Location of therapist: home office. Pt has been married for about a year and she states that is going well.   Pt's family and friends really like her husband.   Both pt and husband are employed and doing well with their jobs.   Pt decided to come back to therapy for "maintenance" and to have a place to talk.    Pt states she is dealing with stress.  She had some depression during the holidays.  Addressed what triggered feeling down for her.  She thinks the overwhelm of the holidays made her mood low.  Pt's job has some stress as well.  Her work schedule is better than her previous job and she has most weekends off.  Pt feels like she has better quality of life but there are still some stressors.   Mental Status Exam: Appearance:   Casual     Behavior:  Appropriate  Motor:  Normal  Speech/Language:   Normal Rate  Affect:  Appropriate  Mood:  normal  Thought process:  normal  Thought content:    WNL  Sensory/Perceptual disturbances:    WNL  Orientation:  oriented to person, place, time/date, and situation  Attention:  Good  Concentration:  Good  Memory:  WNL  Fund of knowledge:   Good  Insight:    Good  Judgment:   Good  Impulse Control:  Good    Reported Symptoms:  stress  Risk Assessment: Danger to Self:  No Self-injurious Behavior: No Danger to Others: No Duty to Warn:no Physical Aggression / Violence:No  Access to Firearms a concern: No  Gang Involvement:No  Patient / guardian was educated about steps to take if  suicide or homicide risk level increases between visits: no While future psychiatric events cannot be accurately predicted, the patient does not currently require acute inpatient psychiatric care and does not currently meet [facility] involuntary commitment criteria.  Substance Abuse History: Current substance abuse: No     Past Psychiatric History:   No previous psychological problems have been observed Outpatient Providers:n/a History of Psych Hospitalization: No  Psychological Testing:  n/a    Abuse History:  Victim of: No.,  none    Report needed: No. Victim of Neglect:No. Perpetrator of  n/a   Witness / Exposure to Domestic Violence: No   Protective Services Involvement: No  Witness to [employer] Violence:  No   Family History:  Family History  Problem Relation Age of Onset   Hypertension Mother    Thyroid disease Mother    Diabetes Mother    Diabetes Father    COPD Father    Colon cancer Maternal Grandfather 85   Diabetes Other    Hypertension Other    Depression Other     Living situation: the patient lives with her spouse.  Pt grew up with mother.  Her parents got divorced when pt was 51 yrs old.  Pt had a stepfather who she describes as "great to her".   Pt has a [place] half sister and an older 83.  Pt has an older half sister and a [place] half brother from her father.   Pt and stepsister are very close.   Pt did not see her father very much when she was growing up.   Family history of mental illness:  maternal grandmother had depression. Pt states her family was not affectionate.  Pt knew her mother loved her but she rarely was told that or hugged.  Pt states that her mother was a perfect mom for her sister but not a great mom for her.   Pt has worked on the relationship with her mother in adulthood.   No childhood abuse.    Sexual Orientation: Straight  Relationship Status: married  Name of spouse / other:william If a parent, number of  children / ages:n/a  Support Systems: spouse friends parents  Financial Stress:  No   Income/Employment/Disability: Employment Pt has a job at [employer] center and has been there for a year. Military Service: No   Educational History: Education: some college  Religion/Sprituality/World View: N/a  Any cultural differences that may affect / interfere with treatment:  not applicable   Recreation/Hobbies: reading  Stressors: Marital or family conflict   Occupational concerns    Strengths: Supportive Relationships, Hopefulness, Self Advocate, and Able to Communicate Effectively  Barriers:  none   Legal History: Pending legal issue / charges: The patient has no significant history of legal issues. History of legal issue / charges:  none  Medical History/Surgical History: reviewed Past Medical History:  Diagnosis Date   Anxiety    Depression    Diabetes mellitus    Hypertension     Past Surgical History:  Procedure Laterality Date   TONSILLECTOMY      Medications: Current Outpatient Medications  Medication Sig Dispense Refill   albuterol (PROAIR HFA) 108 (90 Base) MCG/ACT inhaler Inhale 2 puffs into the lungs every 6 (six) hours as needed for wheezing or shortness of breath. 1 Inhaler 5   atorvastatin (LIPITOR) 20 MG tablet Take 1 tablet by mouth once daily 90 tablet 1   citalopram (CELEXA) 20 MG tablet Take 1 tablet by mouth once daily 90 tablet 1   clonazePAM (KLONOPIN) 1 MG tablet 1 po tid prn 30 tablet 1   COVID-19 mRNA bivalent vaccine, Moderna, (MODERNA COVID-19 BIVAL BOOSTER) 50 MCG/0.5ML injection Inject into the muscle. 0.5 mL 0   fluticasone (FLONASE) 50 MCG/ACT nasal spray Place 2 sprays into both nostrils daily. 16 g 5   glucose blood (ONETOUCH VERIO) test strip Use as instructed twice a day.  E11.9 100 each 1   Insulin Pen Needle (BD PEN NEEDLE NANO 2ND GEN) 32G X 4 MM MISC Use as directed with Victoza once a day 100 each 3   levonorgestrel (MIRENA) 20  MCG/24HR IUD 1 each by Intrauterine route once.       lisinopril-hydrochlorothiazide (ZESTORETIC) 10-12.5 MG tablet Take 1 tablet by mouth once daily 90 tablet 1   metFORMIN (GLUCOPHAGE) 1000 MG tablet TAKE 1 TABLET BY MOUTH TWICE DAILY WITH MEALS 180 tablet 1   montelukast (SINGULAIR) 10 MG tablet TAKE 1 TABLET BY MOUTH AT BEDTIME **Please Keep appointment** 90 tablet 0   ONETOUCH DELICA LANCETS FINE MISC Use as instructed twice a day.  E11.9 100 each 1   Semaglutide,0.25 or 0.5MG /DOS, (OZEMPIC, 0.25 OR 0.5 MG/DOSE,) 2 MG/1.5ML SOPN 0.25 mg weekly x4 then increase to 0.5 mg 1.5 mL 3   No current facility-administered medications for this visit.    No Known  Allergies  Diagnoses:  Adjustment disorder with mixed anxiety and depressed mood  Plan of Care: Recommend ongoing therapy.   Pt participated in setting treatment goals.   Plan to meet monthly.   Treatment Plan    Client Abilities/Strengths   Pt is bright, engaging, and motivated for therapy.  Client Treatment Preferences   Individual therapy.  Client Statement of Needs   Improve copings skills and have a safe place to talk about issues.   Symptoms   Depressed or irritable mood. Excessive and/or unrealistic worry that is difficult to control occurring more days than not for at least 6 months about a number of events or activities.  Problems Addressed   Unipolar Depression, Anxiety Goals  1. Alleviate depressive symptoms and return to previous level of effective functioning.  2. Appropriately grieve the loss in order to normalize mood and to return to previously adaptive level of functioning.  Objective  Learn and implement behavioral strategies to overcome depression. Target Date: 2022-08-01 Frequency: monthly  Progress: 30 Modality: individual  Related Interventions  Assist the client in developing skills that increase the likelihood of deriving pleasure from behavioral activation (e.g., assertiveness skills,  developing an exercise plan, less internal/more external focus, increased social involvement); reinforce success. Engage the client in "behavioral activation," increasing his/her activity level and contact with sources of reward, while identifying processes that inhibit activation. use behavioral techniques such as instruction, rehearsal, role-playing, role reversal, as needed, to facilitate activity in the client's daily life; reinforce success. 3. Develop healthy interpersonal relationships that lead to the alleviation and help prevent the relapse of depression.  4. Develop healthy thinking patterns and beliefs about self, others, and the world that lead to the alleviation and help prevent the relapse of depression.  5. Enhance ability to effectively cope with the full variety of life's worries and anxieties.  6. Learn and implement coping skills that result in a reduction of anxiety and worry, and improved daily functioning.  Objective  Learn and implement problem-solving strategies for realistically addressing worries. Target Date: 2022-08-01 Frequency: monthly  Progress: 30 Modality: individual  Related Interventions  Assign the client a homework exercise in which he/she problem-solves a current problem (see Mastery of Your Anxiety and Worry: Workbook by Adora Fridge and Eliot Ford or Generalized Anxiety Disorder by Eather Colas, and Eliot Ford); review, reinforce success, and provide corrective feedback toward improvement. Teach the client problem-solving strategies involving specifically defining a problem, generating options for addressing it, evaluating the pros and cons of each option, selecting and implementing an optional action, and reevaluating and refining the action. Objective  Learn and implement calming skills to reduce overall anxiety and manage anxiety symptoms. Target Date: 2022-08-01 Frequency: monthly  Progress: 30 Modality: individual  Related Interventions  Assign the client to  read about progressive muscle relaxation and other calming strategies in relevant books or treatment manuals (e.g., Progressive Relaxation Training by Leroy Kennedy; Mastery of Your Anxiety and Worry: Workbook by Beckie Busing). Assign the client homework each session in which he/she practices relaxation exercises daily, gradually applying them progressively from non-anxiety-provoking to anxiety-provoking situations; review and reinforce success while providing corrective feedback toward improvement. Teach the client calming/relaxation skills (e.g., applied relaxation, progressive muscle relaxation, cue controlled relaxation; mindful breathing; biofeedback) and how to discriminate better between relaxation and tension; teach the client how to apply these skills to his/her daily life. 7. Recognize, accept, and cope with feelings of depression.  8. Reduce overall frequency, intensity, and duration of the anxiety so  that daily functioning is not impaired.  9. Resolve the core conflict that is the source of anxiety.  10. Stabilize anxiety level while increasing ability to function on a daily basis.  Diagnosis  Axis none F43.21 (Adjustment Disorder, With depressed mood)  Adjustment Disorder, With Depressed Mood   Axis none F43.22 (Adjustment Disorder, With anxiety) Adjustment Disorder, With Anxiety   Conditions For Discharge  Achievement of treatment goals and objectives    Hermelinda Diegel, LCSW    Norina Cowper [place], LCSW

## 2021-08-26 ENCOUNTER — Telehealth: Payer: Self-pay | Admitting: Family Medicine

## 2021-08-26 NOTE — Telephone Encounter (Signed)
Pt stated she has been unable to get ozempic because it was $1200 and out of stock. She is hoping she can get something cheaper prescribed or a prior authorization. She will reschedule follow up when she is able to get medication. She stated her insurance can be reached at [phone]. Please advise.    [place], [address] [facility] [place], [place] [address]  Phone:  [phone]  Fax:  [phone]

## 2021-08-27 NOTE — Telephone Encounter (Signed)
Spoke with [facility] and this was not the same patient. Lawson Fiscal did this PA in back in November

## 2021-08-29 ENCOUNTER — Ambulatory Visit (INDEPENDENT_AMBULATORY_CARE_PROVIDER_SITE_OTHER): Payer: 59 | Admitting: Psychology

## 2021-08-29 DIAGNOSIS — F4323 Adjustment disorder with mixed anxiety and depressed mood: Secondary | ICD-10-CM

## 2021-08-29 NOTE — Progress Notes (Signed)
[facility] Counselor/Therapist Progress Note  Patient ID: Jasmine Hinton, MRN: [id number],    Date: 08/29/2021  Time Spent: 11:00am - 11:50am   50 minutes   Treatment Type: Individual Therapy  Reported Symptoms: frustration  Mental Status Exam: Appearance:  Casual     Behavior: Appropriate  Motor: Normal  Speech/Language:  Normal Rate  Affect: Appropriate  Mood: normal  Thought process: normal  Thought content:   WNL  Sensory/Perceptual disturbances:   WNL  Orientation: oriented to person, place, time/date, and situation  Attention: Good  Concentration: Good  Memory: WNL  Fund of knowledge:  Good  Insight:   Good  Judgment:  Good  Impulse Control: Good   Risk Assessment: Danger to Self:  No Self-injurious Behavior: No Danger to Others: No Duty to Warn:no Physical Aggression / Violence:No  Access to Firearms a concern: No  Gang Involvement:No   Subjective: Pt present for face-to-face initial assessment update via Webex.   Pt consented to telehealth video session due to COVID-19 pandemic.    Location of pt: work Location of therapist: home office. Pt states she is not feelings well physically.  She feels she has a virus of some sort.  She still went to work and is pushing through not feeling well.   Pt talked about feeling frustrated about her health insurance with her new job.  There is a huge deductable for her medications.  Pt is working with her PCP to try to get a medication that is less expensive.  Addressed pt's frustrations and concerns.   Pt's relationship with her husband Gwyndolyn Saxon has been going well overall.   Pt would like more romance in the marriage than she is getting.  Addressed how pt can communicate her needs to her husband.   She states he is very affectionate and thoughtful and kind.   He is not much of a planner so pt ends up doing that.  He cooks and does most of the cleaning.   Pt takes care of the bills and does the planning.    Addressed what the strengths are in each of them and what things she may want to communicate about desired changes and what things she can work on accepting.   Pt talked about her anxiety.  Pt states she has never been able to dig down to the source of her anxiety.  She has been anxious since a child.  Pt states that her anxiety is like a low level hum that is always in the background.   Worked with pt on how she can do things to down regulate her nervous system.  Pt practices meditation and does deep breathing exercises.   Worked on calming strategies.   Provided supportive therapy.       Interventions: Cognitive Behavioral Therapy and Insight-Oriented  Diagnosis:Adjustment disorder with mixed anxiety and depressed mood F43.23    Plan of Care: Recommend ongoing therapy.   Pt participated in setting treatment goals.   Plan to meet monthly.    Treatment Plan  (Treatment Plan Target Date 08/01/2022)  Client Abilities/Strengths   Pt is bright, engaging, and motivated for therapy.  Client Treatment Preferences   Individual therapy.  Client Statement of Needs   Improve copings skills and have a safe place to talk about issues.   Symptoms   Depressed or irritable mood. Excessive and/or unrealistic worry that is difficult to control occurring more days than not for at least 6 months about a number of events or  activities.  Problems Addressed   Unipolar Depression, Anxiety Goals  1. Alleviate depressive symptoms and return to previous level of effective functioning.  2. Appropriately grieve the loss in order to normalize mood and to return to previously adaptive level of functioning.  Objective  Learn and implement behavioral strategies to overcome depression. Target Date: 2022-08-01 Frequency: monthly  Progress: 30 Modality: individual  Related Interventions  Assist the client in developing skills that increase the likelihood of deriving pleasure from behavioral activation (e.g.,  assertiveness skills, developing an exercise plan, less internal/more external focus, increased social involvement); reinforce success. Engage the client in "behavioral activation," increasing his/her activity level and contact with sources of reward, while identifying processes that inhibit activation. use behavioral techniques such as instruction, rehearsal, role-playing, role reversal, as needed, to facilitate activity in the client's daily life; reinforce success. 3. Develop healthy interpersonal relationships that lead to the alleviation and help prevent the relapse of depression.  4. Develop healthy thinking patterns and beliefs about self, others, and the world that lead to the alleviation and help prevent the relapse of depression.  5. Enhance ability to effectively cope with the full variety of life's worries and anxieties.  6. Learn and implement coping skills that result in a reduction of anxiety and worry, and improved daily functioning.  Objective  Learn and implement problem-solving strategies for realistically addressing worries. Target Date: 2022-08-01 Frequency: monthly  Progress: 30 Modality: individual  Related Interventions  Assign the client a homework exercise in which he/she problem-solves a current problem (see Mastery of Your Anxiety and Worry: Workbook by Adora Fridge and Eliot Ford or Generalized Anxiety Disorder by Eather Colas, and Eliot Ford); review, reinforce success, and provide corrective feedback toward improvement. Teach the client problem-solving strategies involving specifically defining a problem, generating options for addressing it, evaluating the pros and cons of each option, selecting and implementing an optional action, and reevaluating and refining the action. Objective  Learn and implement calming skills to reduce overall anxiety and manage anxiety symptoms. Target Date: 2022-08-01 Frequency: monthly  Progress: 30 Modality: individual  Related  Interventions  Assign the client to read about progressive muscle relaxation and other calming strategies in relevant books or treatment manuals (e.g., Progressive Relaxation Training by Leroy Kennedy; Mastery of Your Anxiety and Worry: Workbook by Beckie Busing). Assign the client homework each session in which he/she practices relaxation exercises daily, gradually applying them progressively from non-anxiety-provoking to anxiety-provoking situations; review and reinforce success while providing corrective feedback toward improvement. Teach the client calming/relaxation skills (e.g., applied relaxation, progressive muscle relaxation, cue controlled relaxation; mindful breathing; biofeedback) and how to discriminate better between relaxation and tension; teach the client how to apply these skills to his/her daily life. 7. Recognize, accept, and cope with feelings of depression.  8. Reduce overall frequency, intensity, and duration of the anxiety so that daily functioning is not impaired.  9. Resolve the core conflict that is the source of anxiety.  10. Stabilize anxiety level while increasing ability to function on a daily basis.  Diagnosis  Axis none F43.21 (Adjustment Disorder, With depressed mood)  Adjustment Disorder, With Depressed Mood   Axis none F43.22 (Adjustment Disorder, With anxiety) Adjustment Disorder, With Anxiety   Conditions For Discharge  Achievement of treatment goals and objectives   Clint Bolder, LCSW

## 2021-08-30 ENCOUNTER — Telehealth (INDEPENDENT_AMBULATORY_CARE_PROVIDER_SITE_OTHER): Payer: 59 | Admitting: Family Medicine

## 2021-08-30 ENCOUNTER — Encounter: Payer: Self-pay | Admitting: Family Medicine

## 2021-08-30 VITALS — Temp 101.0°F

## 2021-08-30 DIAGNOSIS — U071 COVID-19: Secondary | ICD-10-CM | POA: Diagnosis not present

## 2021-08-30 MED ORDER — NIRMATRELVIR/RITONAVIR (PAXLOVID)TABLET: ORAL_TABLET | ORAL | 0 refills | Status: DC

## 2021-08-30 MED ORDER — BENZONATATE 200 MG PO CAPS: 200.0000 mg | ORAL_CAPSULE | Freq: Two times a day (BID) | ORAL | 0 refills | Status: DC | PRN

## 2021-08-30 MED ORDER — GUAIFENESIN ER 600 MG PO TB12: 1200.0000 mg | ORAL_TABLET | Freq: Two times a day (BID) | ORAL | 2 refills | Status: DC

## 2021-08-30 NOTE — Progress Notes (Signed)
Virtual Video Visit via MyChart Note  I connected with  Jasmine Hinton on 08/30/21 at 10:20 AM EST by the video enabled telemedicine application for MyChart, and verified that I am speaking with the correct person using two identifiers.   I introduced myself as a Designer, jewellery with the practice. We discussed the limitations of evaluation and management by telemedicine and the availability of in person appointments. The patient expressed understanding and agreed to proceed.  Participating parties in this visit include: The patient and the nurse practitioner listed.  The patient is: At home I am: at home  Subjective:    CC:  Chief Complaint  Patient presents with   Covid Positive    Tested positive this morning Fever, fatigue, scratchy thorat, cough     HPI: Jasmine Hinton is a 51 y.o. year old female presenting today via MyChart today for + COVID test .  Patient reports she started feeling poorly 2 days ago and symptoms have gradually progressed. She initially tested negative for COVID, but repeat testing today was positive. She has been experiencing fatigue, fevers, body aches, sore throat, cough, rhinorrhea. She denies any chest pain, dyspnea, nausea, vomiting, diarrhea, loss of taste/smell.      Past medical history, Surgical history, Family history not pertinant except as noted below, Social history, Allergies, and medications have been entered into the medical record, reviewed, and corrections made.   Review of Systems:  All review of systems negative except what is listed in the HPI   Objective:    General:  Speaking clearly in complete sentences. Absent shortness of breath noted.   Alert and oriented x3.   Normal judgment.  Absent acute distress.   Impression and Recommendations:    1. Positive self-administered antigen test for COVID-19 2. 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 Continue supportive  measures including rest, hydration, humidifier use, steam showers, warm compresses to sinuses, warm liquids with lemon and honey, and over-the-counter cough, cold, and analgesics as needed.  Patient aware of signs/symptoms requiring further/urgent evaluation.    - nirmatrelvir/ritonavir EUA (PAXLOVID) 20 x 150 MG & 10 x 100MG  TABS; 1 dose p.o. twice daily for 5 days, may use any available formulation at pharmacy with the same total dosage.  Dispense: 30 tablet; Refill: 0 - benzonatate (TESSALON) 200 MG capsule; Take 1 capsule (200 mg total) by mouth 2 (two) times daily as needed for cough.  Dispense: 20 capsule; Refill: 0 - guaiFENesin (MUCINEX) 600 MG 12 hr tablet; Take 2 tablets (1,200 mg total) by mouth 2 (two) times daily.  Dispense: 30 tablet; Refill: 2      Follow-up if symptoms worsen or fail to improve.    I discussed the assessment and treatment plan with the patient. The patient was provided an opportunity to ask questions and all were answered. The patient agreed with the plan and demonstrated an understanding of the instructions.   The patient was advised to call back or seek an in-person evaluation if the symptoms worsen or if the condition fails to improve as anticipated.  I spent 20 minutes dedicated to the care of this patient on the date of this encounter to include pre-visit chart review of prior notes and results, face-to-face time with the patient, and post-visit ordering of testing as indicated.   Terrilyn Saver, NP

## 2021-08-30 NOTE — Progress Notes (Signed)
Called at 9:55, no answer

## 2021-08-30 NOTE — Patient Instructions (Signed)
[email or web address]  ** Paxlovid may decrease the concentration of birth control, so please use another form of prevention for the next 2 weeks or so. **  Over the counter medications that may be helpful for symptoms:  Guaifenesin 1200 mg extended release tabs twice daily, with plenty of water For cough and congestion Brand name: Mucinex   Pseudoephedrine 30 mg, one or two tabs every 4 to 6 hours For sinus congestion Brand name: Sudafed You must get this from the pharmacy counter.  Oxymetazoline nasal spray each morning, one spray in each nostril, for NO MORE THAN 3 days  For nasal and sinus congestion Brand name: Afrin Saline nasal spray or Saline Nasal Irrigation 3-5 times a day For nasal and sinus congestion Brand names: Ocean or AYR Fluticasone nasal spray, one spray in each nostril, each morning (after oxymetazoline and saline, if used) For nasal and sinus congestion Brand name: Flonase Warm salt water gargles  For sore throat Every few hours as needed Alternate ibuprofen 400-600 mg and acetaminophen 1000 mg every 4-6 hours For fever, body aches, headache Brand names: Motrin or Advil and Tylenol Dextromethorphan 12-hour cough version 30 mg every 12 hours  For cough Brand name: Delsym Stop all other cold medications for now (Nyquil, Dayquil, Tylenol Cold, Theraflu, etc) and other non-prescription cough/cold preparations. Many of these have the same ingredients listed above and could cause an overdose of medication.   Herbal treatments that have been shown to be helpful in some patients include: Vitamin C 1000mg  per day Vitamin D 4000iU per day Zinc 100mg  per day Quercetin 25-500mg  twice a day Melatonin 5-10mg  at bedtime  General Instructions Allow your body to rest Drink PLENTY of fluids Isolate yourself from everyone, even family, until test results have returned  If your COVID-19 test is positive Then you ARE INFECTED and you can pass the  virus to others You must quarantine from others for a minimum of  5 days since symptoms started AND You are fever free for 24 hours WITHOUT any medication to reduce fever AND Your symptoms are improving Wear mask for the next 5 days If not improved by day 5, quarantine for the full 10 days. Do not go to the store or other public areas Do not go around household members who are not known to be infected with COVID-19 If you MUST leave your area of quarantine (example: go to a bathroom you share with others in your home), you must Wear a mask Wash your hands thoroughly Wipe down any surfaces you touch Do not share food, drinks, towels, or other items with other persons Dispose of your own tissues, food containers, etc  Once you have recovered, please continue good preventive care measures, including:  wearing a mask when in public wash your hands frequently avoid touching your face/nose/eyes cover coughs/sneezes with the inside of your elbow stay out of crowds keep a 6 foot distance from others  If you develop severe shortness of breath, uncontrolled fevers, coughing up blood, confusion, chest pain, or signs of dehydration (such as significantly decreased urine amounts or dizziness with standing) please go to the ER.

## 2021-09-02 ENCOUNTER — Ambulatory Visit: Payer: 59 | Admitting: Family Medicine

## 2021-09-08 ENCOUNTER — Other Ambulatory Visit: Payer: Self-pay | Admitting: Family Medicine

## 2021-09-08 DIAGNOSIS — J4520 Mild intermittent asthma, uncomplicated: Secondary | ICD-10-CM

## 2021-09-25 ENCOUNTER — Ambulatory Visit: Payer: 59 | Admitting: Psychology

## 2021-10-29 ENCOUNTER — Ambulatory Visit (INDEPENDENT_AMBULATORY_CARE_PROVIDER_SITE_OTHER): Payer: 59 | Admitting: Psychology

## 2021-10-29 DIAGNOSIS — F4323 Adjustment disorder with mixed anxiety and depressed mood: Secondary | ICD-10-CM

## 2021-10-29 NOTE — Progress Notes (Signed)
[facility] Counselor/Therapist Progress Note ? ?Patient ID: Jasmine Hinton, MRN: [id number],   ? ?Date: 10/29/2021 ? ?Time Spent:12:00pm-12:55pm   55 minutes  ? ?Treatment Type: Individual Therapy ? ?Reported Symptoms: frustration ? ?Mental Status Exam: ?Appearance:  Casual     ?Behavior: Appropriate  ?Motor: Normal  ?Speech/Language:  Normal Rate  ?Affect: Appropriate  ?Mood: normal  ?Thought process: normal  ?Thought content:   WNL  ?Sensory/Perceptual disturbances:   WNL  ?Orientation: oriented to person, place, time/date, and situation  ?Attention: Good  ?Concentration: Good  ?Memory: WNL  ?Fund of knowledge:  Good  ?Insight:   Good  ?Judgment:  Good  ?Impulse Control: Good  ? ?Risk Assessment: ?Danger to Self:  No ?Self-injurious Behavior: No ?Danger to Others: No ?Duty to Warn:no ?Physical Aggression / Violence:No  ?Access to Firearms a concern: No  ?Gang Involvement:No  ? ?Subjective: Pt present for face-to-face initial assessment update via Webex.   Pt consented to telehealth video session due to COVID-19 pandemic.    ?Location of pt: work ?Location of therapist: home office. ?Pt talked about having a family vacation last week and she had a good time with family.  Pt states it was also "kind of triggering".  She states her family loves her husband but pt feels like they accept her more now that she is married.   Pt had had some jealousy toward her sister at times bc her sister is so attractive.   Pt's mother complains a lot about caring for her parents.  Pt gets frustrated bc her mother does more for her parents than they need.  Helped pt process her feelings and family dynamics.   ?Pt's relationship with her husband Gwyndolyn Saxon.   They are planning to buy a house and this is very stressful for pt.  Pt does not like to move so she is anxious about the situation.   They have been preapproved for a loan.  Pt did their taxes and found out her husband has a $20,000.00 tax bill.  She was very upset bc  he did not take care of his back taxes before they got married.  Addressed pt's feelings and marriage dynamics.    ?Provided supportive therapy.   ? ?  ? ?Interventions: Cognitive Behavioral Therapy and Insight-Oriented ? ?Diagnosis: F43.23 ? ? ? ?Plan of Care: Recommend ongoing therapy.   Pt participated in setting treatment goals.   Plan to meet monthly.   ? ?Treatment Plan  (Treatment Plan Target Date 08/01/2022) ? ?Client Abilities/Strengths  ? ?Pt is bright, engaging, and motivated for therapy.  ?Client Treatment Preferences  ? ?Individual therapy.  ?Client Statement of Needs  ? ?Improve copings skills and have a safe place to talk about issues.   ?Symptoms  ? ?Depressed or irritable mood. Excessive and/or unrealistic worry that is difficult to control occurring more days than not for at least 6 months about a number of events or activities.  ?Problems Addressed  ? ?Unipolar Depression, Anxiety ?Goals ? ?1. Alleviate depressive symptoms and return to previous level of effective functioning. ? ?2. Appropriately grieve the loss in order to normalize mood and to return to previously adaptive level of functioning. ? ?Objective ? ?Learn and implement behavioral strategies to overcome depression. ?Target Date: 2022-08-01 Frequency: monthly  ?Progress: 30 Modality: individual  ?Related Interventions ? ?Assist the client in developing skills that increase the likelihood of deriving pleasure from behavioral activation (e.g., assertiveness skills, developing an exercise plan, less internal/more external focus, increased  social involvement); reinforce success. ?Engage the client in "behavioral activation," increasing his/her activity level and contact with sources of reward, while identifying processes that inhibit activation. use behavioral techniques such as instruction, rehearsal, role-playing, role reversal, as needed, to facilitate activity in the client's daily life; reinforce success. ?3. Develop healthy  interpersonal relationships that lead to the alleviation and help prevent the relapse of depression. ? ?4. Develop healthy thinking patterns and beliefs about self, others, and the world that lead to the alleviation and help prevent the relapse of depression. ? ?5. Enhance ability to effectively cope with the full variety of life's worries and anxieties. ? ?6. Learn and implement coping skills that result in a reduction of anxiety and worry, and improved daily functioning. ? ?Objective ? ?Learn and implement problem-solving strategies for realistically addressing worries. ?Target Date: 2022-08-01 Frequency: monthly  ?Progress: 30 Modality: individual  ?Related Interventions ? ?Assign the client a homework exercise in which he/she problem-solves a current problem (see Mastery of Your Anxiety and Worry: Workbook by Adora Fridge and Eliot Ford or Generalized Anxiety Disorder by Eather Colas, and Eliot Ford); review, reinforce success, and provide corrective feedback toward improvement. ?Teach the client problem-solving strategies involving specifically defining a problem, generating options for addressing it, evaluating the pros and cons of each option, selecting and implementing an optional action, and reevaluating and refining the action. ?Objective ? ?Learn and implement calming skills to reduce overall anxiety and manage anxiety symptoms. ?Target Date: 2022-08-01 Frequency: monthly  ?Progress: 30 Modality: individual  ?Related Interventions ? ?Assign the client to read about progressive muscle relaxation and other calming strategies in relevant books or treatment manuals (e.g., Progressive Relaxation Training by Gwynneth Aliment and Dani Gobble; Mastery of Your Anxiety and Worry: Workbook by Beckie Busing). ?Assign the client homework each session in which he/she practices relaxation exercises daily, gradually applying them progressively from non-anxiety-provoking to anxiety-provoking situations; review and reinforce success while  providing corrective feedback toward improvement. ?Teach the client calming/relaxation skills (e.g., applied relaxation, progressive muscle relaxation, cue controlled relaxation; mindful breathing; biofeedback) and how to discriminate better between relaxation and tension; teach the client how to apply these skills to his/her daily life. ?7. Recognize, accept, and cope with feelings of depression. ? ?8. Reduce overall frequency, intensity, and duration of the anxiety so that daily functioning is not impaired. ? ?9. Resolve the core conflict that is the source of anxiety. ? ?10. Stabilize anxiety level while increasing ability to function on a daily basis. ? ?Diagnosis   F43.23 ? ?Conditions For Discharge ? ?Achievement of treatment goals and objectives  ? ?Ronin Rehfeldt, LCSW ? ? ?

## 2021-12-03 ENCOUNTER — Other Ambulatory Visit: Payer: Self-pay | Admitting: Family Medicine

## 2021-12-03 ENCOUNTER — Ambulatory Visit (INDEPENDENT_AMBULATORY_CARE_PROVIDER_SITE_OTHER): Payer: 59 | Admitting: Psychology

## 2021-12-03 DIAGNOSIS — F4323 Adjustment disorder with mixed anxiety and depressed mood: Secondary | ICD-10-CM

## 2021-12-03 DIAGNOSIS — F418 Other specified anxiety disorders: Secondary | ICD-10-CM

## 2021-12-03 DIAGNOSIS — J4520 Mild intermittent asthma, uncomplicated: Secondary | ICD-10-CM

## 2021-12-03 NOTE — Progress Notes (Signed)
[facility] Counselor/Therapist Progress Note  Patient ID: MING KUNKA, MRN: [id number],    Date: 12/03/2021  Time Spent: 4:00pm-4:50pm   50 minutes   Treatment Type: Individual Therapy  Reported Symptoms: frustration  Mental Status Exam: Appearance:  Casual     Behavior: Appropriate  Motor: Normal  Speech/Language:  Normal Rate  Affect: Appropriate  Mood: normal  Thought process: normal  Thought content:   WNL  Sensory/Perceptual disturbances:   WNL  Orientation: oriented to person, place, time/date, and situation  Attention: Good  Concentration: Good  Memory: WNL  Fund of knowledge:  Good  Insight:   Good  Judgment:  Good  Impulse Control: Good   Risk Assessment: Danger to Self:  No Self-injurious Behavior: No Danger to Others: No Duty to Warn:no Physical Aggression / Violence:No  Access to Firearms a concern: No  Gang Involvement:No   Subjective: Pt present for face-to-face initial assessment update via Webex.   Pt consented to telehealth video session due to COVID-19 pandemic.    Location of pt: work Location of therapist: home office. Pt talked about having her mother's surprise birthday party last week.  It all went well.   Pt has other family events this month.   Her father's birthday is next week and he wants both pt and her half sister to spend time with him.  Pt is not very close to half step sister.  She did not know about her older half sister until pt was in college.   Pt was not raised by her father.  She was raised by her mother and step father.  Pt is concerned it will be awkward being with her half sister.    Pt talked about seeing her grandparents.   She found out she is not their "favorite grandchild".   This hurt her feelings.    Helped pt process her feelings and family dynamics.  Pt talked about "feeling exhausted by life".   She is an introvert so social contact depleats her.   Worked on self care strategies.   Provided  supportive therapy.      Interventions: Cognitive Behavioral Therapy and Insight-Oriented  Diagnosis: F43.23    Plan of Care: Recommend ongoing therapy.   Pt participated in setting treatment goals.   Plan to meet monthly.    Treatment Plan  (Treatment Plan Target Date 08/01/2022)  Client Abilities/Strengths   Pt is bright, engaging, and motivated for therapy.  Client Treatment Preferences   Individual therapy.  Client Statement of Needs   Improve copings skills and have a safe place to talk about issues.   Symptoms   Depressed or irritable mood. Excessive and/or unrealistic worry that is difficult to control occurring more days than not for at least 6 months about a number of events or activities.  Problems Addressed   Unipolar Depression, Anxiety Goals  1. Alleviate depressive symptoms and return to previous level of effective functioning.  2. Appropriately grieve the loss in order to normalize mood and to return to previously adaptive level of functioning.  Objective  Learn and implement behavioral strategies to overcome depression. Target Date: 2022-08-01 Frequency: monthly  Progress: 30 Modality: individual  Related Interventions  Assist the client in developing skills that increase the likelihood of deriving pleasure from behavioral activation (e.g., assertiveness skills, developing an exercise plan, less internal/more external focus, increased social involvement); reinforce success. Engage the client in "behavioral activation," increasing his/her activity level and contact with sources of reward, while identifying processes  that inhibit activation. use behavioral techniques such as instruction, rehearsal, role-playing, role reversal, as needed, to facilitate activity in the client's daily life; reinforce success. 3. Develop healthy interpersonal relationships that lead to the alleviation and help prevent the relapse of depression.  4. Develop healthy thinking  patterns and beliefs about self, others, and the world that lead to the alleviation and help prevent the relapse of depression.  5. Enhance ability to effectively cope with the full variety of life's worries and anxieties.  6. Learn and implement coping skills that result in a reduction of anxiety and worry, and improved daily functioning.  Objective  Learn and implement problem-solving strategies for realistically addressing worries. Target Date: 2022-08-01 Frequency: monthly  Progress: 30 Modality: individual  Related Interventions  Assign the client a homework exercise in which he/she problem-solves a current problem (see Mastery of Your Anxiety and Worry: Workbook by Adora Fridge and Eliot Ford or Generalized Anxiety Disorder by Eather Colas, and Eliot Ford); review, reinforce success, and provide corrective feedback toward improvement. Teach the client problem-solving strategies involving specifically defining a problem, generating options for addressing it, evaluating the pros and cons of each option, selecting and implementing an optional action, and reevaluating and refining the action. Objective  Learn and implement calming skills to reduce overall anxiety and manage anxiety symptoms. Target Date: 2022-08-01 Frequency: monthly  Progress: 30 Modality: individual  Related Interventions  Assign the client to read about progressive muscle relaxation and other calming strategies in relevant books or treatment manuals (e.g., Progressive Relaxation Training by Leroy Kennedy; Mastery of Your Anxiety and Worry: Workbook by Beckie Busing). Assign the client homework each session in which he/she practices relaxation exercises daily, gradually applying them progressively from non-anxiety-provoking to anxiety-provoking situations; review and reinforce success while providing corrective feedback toward improvement. Teach the client calming/relaxation skills (e.g., applied relaxation, progressive  muscle relaxation, cue controlled relaxation; mindful breathing; biofeedback) and how to discriminate better between relaxation and tension; teach the client how to apply these skills to his/her daily life. 7. Recognize, accept, and cope with feelings of depression.  8. Reduce overall frequency, intensity, and duration of the anxiety so that daily functioning is not impaired.  9. Resolve the core conflict that is the source of anxiety.  10. Stabilize anxiety level while increasing ability to function on a daily basis.  Diagnosis   F43.23  Conditions For Discharge  Achievement of treatment goals and objectives   Clint Bolder, LCSW

## 2022-01-08 ENCOUNTER — Ambulatory Visit (INDEPENDENT_AMBULATORY_CARE_PROVIDER_SITE_OTHER): Payer: 59 | Admitting: Psychology

## 2022-01-08 DIAGNOSIS — F4323 Adjustment disorder with mixed anxiety and depressed mood: Secondary | ICD-10-CM | POA: Diagnosis not present

## 2022-01-08 NOTE — Progress Notes (Signed)
[facility] Counselor/Therapist Progress Note  Patient ID: Jasmine Hinton, MRN: [id number],    Date: 01/08/2022  Time Spent: 12:00pm -12:55pm    55 minutes   Treatment Type: Individual Therapy  Reported Symptoms: frustration  Mental Status Exam: Appearance:  Casual     Behavior: Appropriate  Motor: Normal  Speech/Language:  Normal Rate  Affect: Appropriate  Mood: normal  Thought process: normal  Thought content:   WNL  Sensory/Perceptual disturbances:   WNL  Orientation: oriented to person, place, time/date, and situation  Attention: Good  Concentration: Good  Memory: WNL  Fund of knowledge:  Good  Insight:   Good  Judgment:  Good  Impulse Control: Good   Risk Assessment: Danger to Self:  No Self-injurious Behavior: No Danger to Others: No Duty to Warn:no Physical Aggression / Violence:No  Access to Firearms a concern: No  Gang Involvement:No   Subjective: Pt present for face-to-face initial assessment update via Webex.   Pt consented to telehealth video session due to COVID-19 pandemic.    Location of pt: work Location of therapist: home office. Pt talked about being very busy with family celebrations such as birthdays and mother's day and father's day.  Pt states it was a "taxing time" with her family bc there is a big focus on gifts which is stressful.   Addressed the family interactions that are stressful for pt.   Helped pt process her feelings and family dynamics.  Worked with pt on how she can detach from trying to fix the family dynamics between her mother and grandparents.   Pt talked about "feeling exhausted by life".   She is an introvert so social contact depleats her.   Worked on self care strategies.   Provided supportive therapy.      Interventions: Cognitive Behavioral Therapy and Insight-Oriented  Diagnosis: F43.23    Plan of Care: Recommend ongoing therapy.   Pt participated in setting treatment goals.   Plan to meet monthly.     Treatment Plan  (Treatment Plan Target Date 08/01/2022)  Client Abilities/Strengths   Pt is bright, engaging, and motivated for therapy.  Client Treatment Preferences   Individual therapy.  Client Statement of Needs   Improve copings skills and have a safe place to talk about issues.   Symptoms   Depressed or irritable mood. Excessive and/or unrealistic worry that is difficult to control occurring more days than not for at least 6 months about a number of events or activities.  Problems Addressed   Unipolar Depression, Anxiety Goals  1. Alleviate depressive symptoms and return to previous level of effective functioning.  2. Appropriately grieve the loss in order to normalize mood and to return to previously adaptive level of functioning.  Objective  Learn and implement behavioral strategies to overcome depression. Target Date: 2022-08-01 Frequency: monthly  Progress: 30 Modality: individual  Related Interventions  Assist the client in developing skills that increase the likelihood of deriving pleasure from behavioral activation (e.g., assertiveness skills, developing an exercise plan, less internal/more external focus, increased social involvement); reinforce success. Engage the client in "behavioral activation," increasing his/her activity level and contact with sources of reward, while identifying processes that inhibit activation. use behavioral techniques such as instruction, rehearsal, role-playing, role reversal, as needed, to facilitate activity in the client's daily life; reinforce success. 3. Develop healthy interpersonal relationships that lead to the alleviation and help prevent the relapse of depression.  4. Develop healthy thinking patterns and beliefs about self, others, and the world  that lead to the alleviation and help prevent the relapse of depression.  5. Enhance ability to effectively cope with the full variety of life's worries and anxieties.  6. Learn and  implement coping skills that result in a reduction of anxiety and worry, and improved daily functioning.  Objective  Learn and implement problem-solving strategies for realistically addressing worries. Target Date: 2022-08-01 Frequency: monthly  Progress: 30 Modality: individual  Related Interventions  Assign the client a homework exercise in which he/she problem-solves a current problem (see Mastery of Your Anxiety and Worry: Workbook by Adora Fridge and Eliot Ford or Generalized Anxiety Disorder by Eather Colas, and Eliot Ford); review, reinforce success, and provide corrective feedback toward improvement. Teach the client problem-solving strategies involving specifically defining a problem, generating options for addressing it, evaluating the pros and cons of each option, selecting and implementing an optional action, and reevaluating and refining the action. Objective  Learn and implement calming skills to reduce overall anxiety and manage anxiety symptoms. Target Date: 2022-08-01 Frequency: monthly  Progress: 30 Modality: individual  Related Interventions  Assign the client to read about progressive muscle relaxation and other calming strategies in relevant books or treatment manuals (e.g., Progressive Relaxation Training by Leroy Kennedy; Mastery of Your Anxiety and Worry: Workbook by Beckie Busing). Assign the client homework each session in which he/she practices relaxation exercises daily, gradually applying them progressively from non-anxiety-provoking to anxiety-provoking situations; review and reinforce success while providing corrective feedback toward improvement. Teach the client calming/relaxation skills (e.g., applied relaxation, progressive muscle relaxation, cue controlled relaxation; mindful breathing; biofeedback) and how to discriminate better between relaxation and tension; teach the client how to apply these skills to his/her daily life. 7. Recognize, accept, and cope with  feelings of depression.  8. Reduce overall frequency, intensity, and duration of the anxiety so that daily functioning is not impaired.  9. Resolve the core conflict that is the source of anxiety.  10. Stabilize anxiety level while increasing ability to function on a daily basis.  Diagnosis   F43.23  Conditions For Discharge  Achievement of treatment goals and objectives   Clint Bolder, LCSW

## 2022-02-17 ENCOUNTER — Ambulatory Visit (INDEPENDENT_AMBULATORY_CARE_PROVIDER_SITE_OTHER): Payer: 59 | Admitting: Psychology

## 2022-02-17 DIAGNOSIS — F4323 Adjustment disorder with mixed anxiety and depressed mood: Secondary | ICD-10-CM

## 2022-02-17 NOTE — Progress Notes (Signed)
[facility] Counselor/Therapist Progress Note  Patient ID: Jasmine Hinton, MRN: [id number],    Date: 02/17/2022  Time Spent: 2:00pm -2:50pm    50 minutes   Treatment Type: Individual Therapy  Reported Symptoms: frustration  Mental Status Exam: Appearance:  Casual     Behavior: Appropriate  Motor: Normal  Speech/Language:  Normal Rate  Affect: Appropriate  Mood: normal  Thought process: normal  Thought content:   WNL  Sensory/Perceptual disturbances:   WNL  Orientation: oriented to person, place, time/date, and situation  Attention: Good  Concentration: Good  Memory: WNL  Fund of knowledge:  Good  Insight:   Good  Judgment:  Good  Impulse Control: Good   Risk Assessment: Danger to Self:  No Self-injurious Behavior: No Danger to Others: No Duty to Warn:no Physical Aggression / Violence:No  Access to Firearms a concern: No  Gang Involvement:No   Subjective: Pt present for face-to-face initial assessment update via Webex.   Pt consented to telehealth video session due to COVID-19 pandemic.    Location of pt: work Location of therapist: home office. Pt talked about her relationship with her mother.   They had a celebration for pt's grandfather's 83th birthday.   Pt states her mother was challenging to deal with and it was exhausting for pt.  Pt's mother complains about pt's grandparents and feels responsible for their care.  Pt's mother feels like she has to do what pt's grandmother says to do.  Helped pt process her feelings and relationship dynamics.  Pt has a hard time expressing her feelings with others bc she is afraid they will use it against her.  Helped pt process these issues.   Worked on self care strategies.   Provided supportive therapy.      Interventions: Cognitive Behavioral Therapy and Insight-Oriented  Diagnosis: F43.23    Plan of Care: Recommend ongoing therapy.   Pt participated in setting treatment goals.   Plan to meet monthly.     Treatment Plan  (Treatment Plan Target Date 08/01/2022)  Client Abilities/Strengths   Pt is bright, engaging, and motivated for therapy.  Client Treatment Preferences   Individual therapy.  Client Statement of Needs   Improve copings skills and have a safe place to talk about issues.   Symptoms   Depressed or irritable mood. Excessive and/or unrealistic worry that is difficult to control occurring more days than not for at least 6 months about a number of events or activities.  Problems Addressed   Unipolar Depression, Anxiety Goals  1. Alleviate depressive symptoms and return to previous level of effective functioning.  2. Appropriately grieve the loss in order to normalize mood and to return to previously adaptive level of functioning.  Objective  Learn and implement behavioral strategies to overcome depression. Target Date: 2022-08-01 Frequency: monthly  Progress: 30 Modality: individual  Related Interventions  Assist the client in developing skills that increase the likelihood of deriving pleasure from behavioral activation (e.g., assertiveness skills, developing an exercise plan, less internal/more external focus, increased social involvement); reinforce success. Engage the client in "behavioral activation," increasing his/her activity level and contact with sources of reward, while identifying processes that inhibit activation. use behavioral techniques such as instruction, rehearsal, role-playing, role reversal, as needed, to facilitate activity in the client's daily life; reinforce success. 3. Develop healthy interpersonal relationships that lead to the alleviation and help prevent the relapse of depression.  4. Develop healthy thinking patterns and beliefs about self, others, and the world that lead  to the alleviation and help prevent the relapse of depression.  5. Enhance ability to effectively cope with the full variety of life's worries and anxieties.  6. Learn and  implement coping skills that result in a reduction of anxiety and worry, and improved daily functioning.  Objective  Learn and implement problem-solving strategies for realistically addressing worries. Target Date: 2022-08-01 Frequency: monthly  Progress: 30 Modality: individual  Related Interventions  Assign the client a homework exercise in which he/she problem-solves a current problem (see Mastery of Your Anxiety and Worry: Workbook by Adora Fridge and Eliot Ford or Generalized Anxiety Disorder by Eather Colas, and Eliot Ford); review, reinforce success, and provide corrective feedback toward improvement. Teach the client problem-solving strategies involving specifically defining a problem, generating options for addressing it, evaluating the pros and cons of each option, selecting and implementing an optional action, and reevaluating and refining the action. Objective  Learn and implement calming skills to reduce overall anxiety and manage anxiety symptoms. Target Date: 2022-08-01 Frequency: monthly  Progress: 30 Modality: individual  Related Interventions  Assign the client to read about progressive muscle relaxation and other calming strategies in relevant books or treatment manuals (e.g., Progressive Relaxation Training by Leroy Kennedy; Mastery of Your Anxiety and Worry: Workbook by Beckie Busing). Assign the client homework each session in which he/she practices relaxation exercises daily, gradually applying them progressively from non-anxiety-provoking to anxiety-provoking situations; review and reinforce success while providing corrective feedback toward improvement. Teach the client calming/relaxation skills (e.g., applied relaxation, progressive muscle relaxation, cue controlled relaxation; mindful breathing; biofeedback) and how to discriminate better between relaxation and tension; teach the client how to apply these skills to his/her daily life. 7. Recognize, accept, and cope with  feelings of depression.  8. Reduce overall frequency, intensity, and duration of the anxiety so that daily functioning is not impaired.  9. Resolve the core conflict that is the source of anxiety.  10. Stabilize anxiety level while increasing ability to function on a daily basis.  Diagnosis   F43.23  Conditions For Discharge  Achievement of treatment goals and objectives   Clint Bolder, LCSW

## 2022-03-10 ENCOUNTER — Other Ambulatory Visit: Payer: Self-pay | Admitting: Family Medicine

## 2022-03-10 DIAGNOSIS — J4520 Mild intermittent asthma, uncomplicated: Secondary | ICD-10-CM

## 2022-03-10 DIAGNOSIS — F418 Other specified anxiety disorders: Secondary | ICD-10-CM

## 2022-03-21 ENCOUNTER — Ambulatory Visit: Payer: 59 | Admitting: Psychology

## 2022-04-14 ENCOUNTER — Other Ambulatory Visit: Payer: Self-pay | Admitting: Family Medicine

## 2022-04-14 DIAGNOSIS — F418 Other specified anxiety disorders: Secondary | ICD-10-CM

## 2022-04-14 DIAGNOSIS — J4520 Mild intermittent asthma, uncomplicated: Secondary | ICD-10-CM

## 2022-04-17 ENCOUNTER — Telehealth: Payer: Self-pay | Admitting: Family Medicine

## 2022-04-17 DIAGNOSIS — J4520 Mild intermittent asthma, uncomplicated: Secondary | ICD-10-CM

## 2022-04-17 DIAGNOSIS — F418 Other specified anxiety disorders: Secondary | ICD-10-CM

## 2022-04-17 MED ORDER — MONTELUKAST SODIUM 10 MG PO TABS: 10.0000 mg | ORAL_TABLET | Freq: Every day | ORAL | 0 refills | Status: DC

## 2022-04-17 MED ORDER — CITALOPRAM HYDROBROMIDE 20 MG PO TABS: 20.0000 mg | ORAL_TABLET | Freq: Every day | ORAL | 0 refills | Status: DC

## 2022-04-17 NOTE — Telephone Encounter (Signed)
Refill sent.

## 2022-04-17 NOTE — Telephone Encounter (Signed)
Patient would like a partial refill of her two medications to hold her off until her OV on 10/16. Patient had to change appointment due to [facility] not in office, which would make her run out. Please advise.    citalopram (CELEXA) 20 MG tablet   montelukast (SINGULAIR) 10 MG tablet

## 2022-04-18 ENCOUNTER — Ambulatory Visit (INDEPENDENT_AMBULATORY_CARE_PROVIDER_SITE_OTHER): Payer: 59 | Admitting: Psychology

## 2022-04-18 DIAGNOSIS — F4323 Adjustment disorder with mixed anxiety and depressed mood: Secondary | ICD-10-CM
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
## 2022-04-21 ENCOUNTER — Ambulatory Visit: Payer: 59 | Admitting: Family Medicine

## 2022-04-28 ENCOUNTER — Encounter: Payer: Self-pay | Admitting: Family Medicine

## 2022-04-28 ENCOUNTER — Ambulatory Visit (INDEPENDENT_AMBULATORY_CARE_PROVIDER_SITE_OTHER): Payer: 59 | Admitting: Family Medicine

## 2022-04-28 ENCOUNTER — Telehealth: Payer: Self-pay | Admitting: Family Medicine

## 2022-04-28 VITALS — BP 118/78 | HR 102 | Temp 98.8°F | Resp 18 | Ht 65.0 in | Wt 220.0 lb

## 2022-04-28 DIAGNOSIS — J4520 Mild intermittent asthma, uncomplicated: Secondary | ICD-10-CM

## 2022-04-28 DIAGNOSIS — E1165 Type 2 diabetes mellitus with hyperglycemia: Secondary | ICD-10-CM

## 2022-04-28 DIAGNOSIS — Z23 Encounter for immunization: Secondary | ICD-10-CM | POA: Diagnosis not present

## 2022-04-28 DIAGNOSIS — Z1211 Encounter for screening for malignant neoplasm of colon: Secondary | ICD-10-CM

## 2022-04-28 DIAGNOSIS — E1169 Type 2 diabetes mellitus with other specified complication: Secondary | ICD-10-CM | POA: Diagnosis not present

## 2022-04-28 DIAGNOSIS — J0110 Acute frontal sinusitis, unspecified: Secondary | ICD-10-CM

## 2022-04-28 DIAGNOSIS — F418 Other specified anxiety disorders: Secondary | ICD-10-CM | POA: Diagnosis not present

## 2022-04-28 DIAGNOSIS — I10 Essential (primary) hypertension: Secondary | ICD-10-CM | POA: Diagnosis not present

## 2022-04-28 DIAGNOSIS — E785 Hyperlipidemia, unspecified: Secondary | ICD-10-CM | POA: Diagnosis not present

## 2022-04-28 LAB — COMPREHENSIVE METABOLIC PANEL
ALT: 27 U/L (ref 0–35)
AST: 16 U/L (ref 0–37)
Albumin: 4.2 g/dL (ref 3.5–5.2)
Alkaline Phosphatase: 69 U/L (ref 39–117)
BUN: 8 mg/dL (ref 6–23)
CO2: 27 mEq/L (ref 19–32)
Calcium: 9 mg/dL (ref 8.4–10.5)
Chloride: 100 mEq/L (ref 96–112)
Creatinine, Ser: 0.56 mg/dL (ref 0.40–1.20)
GFR: 107.6 mL/min (ref >60.00)
Glucose, Bld: 277 mg/dL — ABNORMAL HIGH (ref 70–99)
Potassium: 3.8 mEq/L (ref 3.5–5.1)
Sodium: 136 mEq/L (ref 135–145)
Total Bilirubin: 0.5 mg/dL (ref 0.2–1.2)
Total Protein: 6.8 g/dL (ref 6.0–8.3)

## 2022-04-28 LAB — CBC WITH DIFFERENTIAL/PLATELET
Basophils Absolute: 0 10*3/uL (ref 0.0–0.1)
Basophils Relative: 0.9 % (ref 0.0–3.0)
Eosinophils Absolute: 0.1 10*3/uL (ref 0.0–0.7)
Eosinophils Relative: 1.9 % (ref 0.0–5.0)
HCT: 39.5 % (ref 36.0–46.0)
Hemoglobin: 13 g/dL (ref 12.0–15.0)
Lymphocytes Relative: 48.4 % — ABNORMAL HIGH (ref 12.0–46.0)
Lymphs Abs: 2.1 10*3/uL (ref 0.7–4.0)
MCHC: 32.9 g/dL (ref 30.0–36.0)
MCV: 88.5 fl (ref 78.0–100.0)
Monocytes Absolute: 0.4 10*3/uL (ref 0.1–1.0)
Monocytes Relative: 8.7 % (ref 3.0–12.0)
Neutro Abs: 1.8 10*3/uL (ref 1.4–7.7)
Neutrophils Relative %: 40.1 % — ABNORMAL LOW (ref 43.0–77.0)
Platelets: 267 10*3/uL (ref 150.0–400.0)
RBC: 4.47 Mil/uL (ref 3.87–5.11)
RDW: 13.8 % (ref 11.5–15.5)
WBC: 4.4 10*3/uL (ref 4.0–10.5)

## 2022-04-28 LAB — LIPID PANEL
Cholesterol: 154 mg/dL (ref 0–200)
HDL: 44.1 mg/dL (ref >39.00)
LDL Cholesterol: 96 mg/dL (ref 0–99)
NonHDL: 109.91
Total CHOL/HDL Ratio: 3
Triglycerides: 72 mg/dL (ref 0.0–149.0)
VLDL: 14.4 mg/dL (ref 0.0–40.0)

## 2022-04-28 LAB — HEMOGLOBIN A1C: Hgb A1c MFr Bld: 11.2 % — ABNORMAL HIGH (ref 4.6–6.5)

## 2022-04-28 MED ORDER — MONTELUKAST SODIUM 10 MG PO TABS: 10.0000 mg | ORAL_TABLET | Freq: Every day | ORAL | 3 refills | Status: DC

## 2022-04-28 MED ORDER — FLUTICASONE PROPIONATE 50 MCG/ACT NA SUSP: 2.0000 | Freq: Every day | NASAL | 5 refills | Status: DC

## 2022-04-28 MED ORDER — LISINOPRIL-HYDROCHLOROTHIAZIDE 10-12.5 MG PO TABS: 1.0000 | ORAL_TABLET | Freq: Every day | ORAL | 3 refills | Status: DC

## 2022-04-28 MED ORDER — ALBUTEROL SULFATE HFA 108 (90 BASE) MCG/ACT IN AERS: 2.0000 | INHALATION_SPRAY | Freq: Four times a day (QID) | RESPIRATORY_TRACT | 5 refills | Status: AC | PRN

## 2022-04-28 MED ORDER — ATORVASTATIN CALCIUM 20 MG PO TABS: 20.0000 mg | ORAL_TABLET | Freq: Every day | ORAL | 0 refills | Status: DC

## 2022-04-28 MED ORDER — CITALOPRAM HYDROBROMIDE 20 MG PO TABS: 20.0000 mg | ORAL_TABLET | Freq: Every day | ORAL | 3 refills | Status: DC

## 2022-04-28 MED ORDER — METFORMIN HCL 1000 MG PO TABS: 1000.0000 mg | ORAL_TABLET | Freq: Two times a day (BID) | ORAL | 1 refills | Status: DC

## 2022-04-28 MED ORDER — CLONAZEPAM 1 MG PO TABS: ORAL_TABLET | ORAL | 1 refills | Status: DC

## 2022-04-28 NOTE — Assessment & Plan Note (Signed)
hgba1c to be checked, minimize simple carbs. Increase exercise as tolerated. Continue current meds  

## 2022-04-28 NOTE — Assessment & Plan Note (Signed)
Tolerating statin, encouraged heart healthy diet, avoid trans fats, minimize simple carbs and saturated fats. Increase exercise as tolerated 

## 2022-04-28 NOTE — Telephone Encounter (Signed)
Pt stated she was supposed to get ozempic called in, and would like to know what the status of that is. \\\     [place], [address] [place], [place] [address] Phone: [phone]  Fax: [phone]

## 2022-04-28 NOTE — Progress Notes (Signed)
Subjective:   By signing my name below, I, Jasmine Hinton, attest that this documentation has been prepared under the direction and in the presence of Jasmine Held DO 04/28/2022   Patient ID: Jasmine Hinton, female    DOB: [birth year], 51 y.o.   MRN: [id number]  Chief Complaint  Patient presents with   Hypertension   Hyperlipidemia   Diabetes   Follow-up    HPI Patient is in today for an office visit  She is requesting a refill of all of her medications.   She reports that she became sick w/ symptoms about three weeks ago. She had a fever around the time that her symptoms started. Since then, her fever is resolved but has a dry cough that is persistent.   She reports that her 10 Mg of Singulair alleviates her allergies.   She is requesting cheaper alternatives to receive an Ozempic medication. She reports that she spoke to her Insurance to receive the medication for her diabetes but was denied the full/most of the coverage.   She reports that she was not contacted to schedule a colonoscopy appointment  She is interested in receiving an influenza vaccine during today's visit.   She is not UTD on vision exams   Past Medical History:  Diagnosis Date   Anxiety    Depression    Diabetes mellitus    Hypertension     Past Surgical History:  Procedure Laterality Date   TONSILLECTOMY      Family History  Problem Relation Age of Onset   Hypertension Mother    Thyroid disease Mother    Diabetes Mother    Diabetes Father    COPD Father    Colon cancer Maternal Grandfather 86   Diabetes Other    Hypertension Other    Depression Other     Social History   Socioeconomic History   Marital status: Married    Spouse name: Jasmine Hinton   Number of children: Not on file   Years of education: Not on file   Highest education level: Not on file  Occupational History   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    Comment: one partner currently  Other Topics Concern   Not on file  Social History Narrative   Not on file   Social Determinants of Health   Financial Resource Strain: Not on file  Food Insecurity: Not on file  Transportation Needs: Not on file  Physical Activity: Not on file  Stress: Not on file  Social Connections: Not on file  Intimate Partner Violence: Not on file    Outpatient Medications Prior to Visit  Medication Sig Dispense Refill   glucose blood (ONETOUCH VERIO) test strip Use as instructed twice a day.  E11.9 100 each 1   guaiFENesin (MUCINEX) 600 MG 12 hr tablet Take 2 tablets (1,200 mg total) by mouth 2 (two) times daily. 30 tablet 2   Insulin Pen Needle (BD PEN NEEDLE NANO 2ND GEN) 32G X 4 MM MISC Use as directed with Victoza once a day 100 each 3   levonorgestrel (MIRENA) 20 MCG/24HR IUD 1 each by Intrauterine route once.  ONETOUCH DELICA LANCETS FINE MISC Use as instructed twice a day.  E11.9 100 each 1   Semaglutide,0.25 or 0.'5MG'$ /DOS, (OZEMPIC, 0.25 OR 0.5 MG/DOSE,) 2 MG/1.5ML SOPN 0.25 mg weekly x4 then increase to 0.5 mg 1.5 mL 3   albuterol (PROAIR HFA) 108 (90 Base) MCG/ACT inhaler Inhale 2 puffs into the lungs every 6 (six) hours as needed for wheezing or shortness of breath. 1 Inhaler 5   atorvastatin (LIPITOR) 20 MG tablet Take 1 tablet (20 mg total) by mouth daily. 30 tablet 0   citalopram (CELEXA) 20 MG tablet Take 1 tablet (20 mg total) by mouth daily. 30 tablet 0   clonazePAM (KLONOPIN) 1 MG tablet 1 po tid prn 30 tablet 1   fluticasone (FLONASE) 50 MCG/ACT nasal spray Place 2 sprays into both nostrils daily. 16 g 5   lisinopril-hydrochlorothiazide (ZESTORETIC) 10-12.5 MG tablet Take 1 tablet by mouth daily. 30 tablet 0   metFORMIN  (GLUCOPHAGE) 1000 MG tablet TAKE 1 TABLET BY MOUTH TWICE DAILY WITH MEALS 180 tablet 1   montelukast (SINGULAIR) 10 MG tablet Take 1 tablet (10 mg total) by mouth at bedtime. 30 tablet 0   benzonatate (TESSALON) 200 MG capsule Take 1 capsule (200 mg total) by mouth 2 (two) times daily as needed for cough. 20 capsule 0   COVID-19 mRNA bivalent vaccine, Moderna, (MODERNA COVID-19 BIVAL BOOSTER) 50 MCG/0.5ML injection Inject into the muscle. (Patient not taking: Reported on 04/28/2022) 0.5 mL 0   nirmatrelvir/ritonavir EUA (PAXLOVID) 20 x 150 MG & 10 x '100MG'$  TABS 1 dose p.o. twice daily for 5 days, may use any available formulation at pharmacy with the same total dosage. (Patient not taking: Reported on 04/28/2022) 30 tablet 0   No facility-administered medications prior to visit.    No Known Allergies  Review of Systems  Constitutional:  Negative for fever and malaise/fatigue.  HENT:  Negative for congestion.   Eyes:  Negative for blurred vision.  Respiratory:  Negative for shortness of breath.   Cardiovascular:  Negative for chest pain, palpitations and leg swelling.  Gastrointestinal:  Negative for abdominal pain, blood in stool and nausea.  Genitourinary:  Negative for dysuria and frequency.  Musculoskeletal:  Negative for falls.  Skin:  Negative for rash.  Neurological:  Negative for dizziness, loss of consciousness and headaches.  Endo/Heme/Allergies:  Negative for environmental allergies.  Psychiatric/Behavioral:  Negative for depression. The patient is not nervous/anxious.        Objective:    Physical Exam Vitals and nursing note reviewed.  Constitutional:      General: She is not in acute distress.    Appearance: Normal appearance. She is well-developed. She is not ill-appearing or diaphoretic.  HENT:     Head: Normocephalic and atraumatic.     Right Ear: External ear normal.     Left Ear: External ear normal.     Nose: Nose normal.  Eyes:     General:        Right  eye: No discharge.        Left eye: No discharge.     Extraocular Movements: Extraocular movements intact.     Conjunctiva/sclera: Conjunctivae normal.     Pupils: Pupils are equal, round, and reactive to light.  Neck:     Thyroid: No thyromegaly.     Vascular: No JVD.  Cardiovascular:     Rate and Rhythm: Normal rate and regular rhythm.     Heart sounds: Normal heart sounds. No murmur heard.  No gallop.  Pulmonary:     Effort: Pulmonary effort is normal. No respiratory distress.     Breath sounds: Normal breath sounds. No wheezing or rales.  Chest:     Chest wall: No tenderness.  Abdominal:     General: Bowel sounds are normal. There is no distension.     Palpations: Abdomen is soft. There is no mass.     Tenderness: There is no abdominal tenderness. There is no guarding or rebound.  Musculoskeletal:        General: No tenderness. Normal range of motion.     Cervical back: Normal range of motion and neck supple.  Lymphadenopathy:     Cervical: No cervical adenopathy.  Skin:    General: Skin is warm and dry.     Findings: No erythema or rash.  Neurological:     Mental Status: She is alert and oriented to person, place, and time.     Cranial Nerves: No cranial nerve deficit.     Deep Tendon Reflexes: Reflexes are normal and symmetric.  Psychiatric:        Behavior: Behavior normal.        Thought Content: Thought content normal.        Judgment: Judgment normal.     BP 118/78 (BP Location: Left Arm, Patient Position: Sitting, Cuff Size: Large)   Pulse (!) 102   Temp 98.8 F (37.1 C) (Oral)   Resp 18   Ht '5\' 5"'$  (1.651 m)   Wt 220 lb (99.8 kg)   SpO2 100%   BMI 36.61 kg/m  Wt Readings from Last 3 Encounters:  04/28/22 220 lb (99.8 kg)  05/31/21 211 lb 12.8 oz (96.1 kg)  05/29/20 222 lb 3.2 oz (100.8 kg)    Diabetic Foot Exam - Simple   Simple Foot Form Diabetic Foot exam was performed with the following findings: Yes 04/28/2022  9:54 AM  Visual Inspection No  deformities, no ulcerations, no other skin breakdown bilaterally: Yes Sensation Testing Intact to touch and monofilament testing bilaterally: Yes Pulse Check Posterior Tibialis and Dorsalis pulse intact bilaterally: Yes Comments    Lab Results  Component Value Date   WBC 4.0 05/29/2020   HGB 14.0 05/29/2020   HCT 42.0 05/29/2020   PLT 302.0 05/29/2020   GLUCOSE 208 (H) 05/29/2020   CHOL 134 05/29/2020   TRIG 87.0 05/29/2020   HDL 44.50 05/29/2020   LDLCALC 72 05/29/2020   ALT 25 05/29/2020   AST 14 05/29/2020   NA 136 05/29/2020   K 3.9 05/29/2020   CL 99 05/29/2020   CREATININE 0.74 05/29/2020   BUN 11 05/29/2020   CO2 29 05/29/2020   TSH 1.34 05/29/2020   HGBA1C 9.7 (H) 05/29/2020   MICROALBUR <0.7 05/29/2020    Lab Results  Component Value Date   TSH 1.34 05/29/2020   Lab Results  Component Value Date   WBC 4.0 05/29/2020   HGB 14.0 05/29/2020   HCT 42.0 05/29/2020   MCV 87.3 05/29/2020   PLT 302.0 05/29/2020   Lab Results  Component Value Date   NA 136 05/29/2020   K 3.9 05/29/2020   CO2 29 05/29/2020   GLUCOSE 208 (H) 05/29/2020   BUN 11 05/29/2020   CREATININE 0.74 05/29/2020   BILITOT 0.4 05/29/2020   ALKPHOS 76 05/29/2020   AST 14 05/29/2020   ALT 25 05/29/2020   PROT 7.2 05/29/2020   ALBUMIN 4.3 05/29/2020   CALCIUM 9.5 05/29/2020   GFR 96.68 05/29/2020  Lab Results  Component Value Date   CHOL 134 05/29/2020   Lab Results  Component Value Date   HDL 44.50 05/29/2020   Lab Results  Component Value Date   LDLCALC 72 05/29/2020   Lab Results  Component Value Date   TRIG 87.0 05/29/2020   Lab Results  Component Value Date   CHOLHDL 3 05/29/2020   Lab Results  Component Value Date   HGBA1C 9.7 (H) 05/29/2020       Assessment & Plan:   Problem List Items Addressed This Visit       Unprioritized   Acute sinusitis   Relevant Medications   fluticasone (FLONASE) 50 MCG/ACT nasal spray   Hyperlipidemia LDL goal <70     Tolerating statin, encouraged heart healthy diet, avoid trans fats, minimize simple carbs and saturated fats. Increase exercise as tolerated      Relevant Medications   atorvastatin (LIPITOR) 20 MG tablet   lisinopril-hydrochlorothiazide (ZESTORETIC) 10-12.5 MG tablet   Essential hypertension    Well controlled, no changes to meds. Encouraged heart healthy diet such as the DASH diet and exercise as tolerated.       Relevant Medications   atorvastatin (LIPITOR) 20 MG tablet   clonazePAM (KLONOPIN) 1 MG tablet   lisinopril-hydrochlorothiazide (ZESTORETIC) 10-12.5 MG tablet   Diabetes mellitus, type II ([place]) - Primary    hgba1c to be checked , minimize simple carbs. Increase exercise as tolerated. Continue current meds       Relevant Medications   atorvastatin (LIPITOR) 20 MG tablet   lisinopril-hydrochlorothiazide (ZESTORETIC) 10-12.5 MG tablet   metFORMIN (GLUCOPHAGE) 1000 MG tablet   Other Relevant Orders   CBC with Differential/Platelet   Comprehensive metabolic panel   Hemoglobin A1c   Lipid panel   Other Visit Diagnoses     Need for influenza vaccination       Relevant Orders   Flu Vaccine QUAD 40moIM (Fluarix, Fluzone & Alfiuria Quad PF) (Completed)   Depression with anxiety       Relevant Medications   citalopram (CELEXA) 20 MG tablet   clonazePAM (KLONOPIN) 1 MG tablet   Mild intermittent asthma without complication       Relevant Medications   albuterol (PROAIR HFA) 108 (90 Base) MCG/ACT inhaler   montelukast (SINGULAIR) 10 MG tablet   Hyperlipidemia associated with type 2 diabetes mellitus (HCC)       Relevant Medications   atorvastatin (LIPITOR) 20 MG tablet   lisinopril-hydrochlorothiazide (ZESTORETIC) 10-12.5 MG tablet   metFORMIN (GLUCOPHAGE) 1000 MG tablet   Other Relevant Orders   CBC with Differential/Platelet   Comprehensive metabolic panel   Hemoglobin A1c   Lipid panel   Primary hypertension       Relevant Medications   atorvastatin (LIPITOR) 20  MG tablet   lisinopril-hydrochlorothiazide (ZESTORETIC) 10-12.5 MG tablet   Other Relevant Orders   CBC with Differential/Platelet   Comprehensive metabolic panel   Hemoglobin A1c   Lipid panel   Colon cancer screening       Relevant Orders   Ambulatory referral to Gastroenterology      Meds ordered this encounter  Medications   albuterol (PROAIR HFA) 108 (90 Base) MCG/ACT inhaler    Sig: Inhale 2 puffs into the lungs every 6 (six) hours as needed for wheezing or shortness of breath.    Dispense:  1 each    Refill:  5   atorvastatin (LIPITOR) 20 MG tablet    Sig: Take 1 tablet (20 mg total) by mouth  daily.    Dispense:  30 tablet    Refill:  0    Requested drug refills are authorized, however, the patient needs further evaluation and/or laboratory testing before further refills are given. Ask her to make an appointment for this.   citalopram (CELEXA) 20 MG tablet    Sig: Take 1 tablet (20 mg total) by mouth daily.    Dispense:  90 tablet    Refill:  3    Requested drug refills are authorized, however, the patient needs further evaluation and/or laboratory testing before further refills are given. Ask her to make an appointment for this.   clonazePAM (KLONOPIN) 1 MG tablet    Sig: 1 po tid prn    Dispense:  90 tablet    Refill:  1   fluticasone (FLONASE) 50 MCG/ACT nasal spray    Sig: Place 2 sprays into both nostrils daily.    Dispense:  16 g    Refill:  5   lisinopril-hydrochlorothiazide (ZESTORETIC) 10-12.5 MG tablet    Sig: Take 1 tablet by mouth daily.    Dispense:  90 tablet    Refill:  3    Requested drug refills are authorized, however, the patient needs further evaluation and/or laboratory testing before further refills are given. Ask her to make an appointment for this.   metFORMIN (GLUCOPHAGE) 1000 MG tablet    Sig: Take 1 tablet (1,000 mg total) by mouth 2 (two) times daily with a meal.    Dispense:  180 tablet    Refill:  1   montelukast (SINGULAIR) 10 MG  tablet    Sig: Take 1 tablet (10 mg total) by mouth at bedtime.    Dispense:  90 tablet    Refill:  3    Requested drug refills are authorized, however, the patient needs further evaluation and/or laboratory testing before further refills are given. Ask her to make an appointment for this.    IAnn Held, DO, personally preformed the services described in this documentation.  All medical record entries made by the scribe were at my direction and in my presence.  I have reviewed the chart and discharge instructions (if applicable) and agree that the record reflects my personal performance and is accurate and complete. 04/28/2022   I,Amber Collins,acting as a scribe for Jasmine Held, DO.,have documented all relevant documentation on the behalf of Jasmine Held, DO,as directed by  Jasmine Held, DO while in the presence of Jasmine Held, DO.    Jasmine Held, DO

## 2022-04-28 NOTE — Patient Instructions (Signed)

## 2022-04-28 NOTE — Assessment & Plan Note (Signed)
Well controlled, no changes to meds. Encouraged heart healthy diet such as the DASH diet and exercise as tolerated.  °

## 2022-04-29 ENCOUNTER — Telehealth: Payer: Self-pay | Admitting: Family Medicine

## 2022-04-29 ENCOUNTER — Telehealth: Payer: Self-pay | Admitting: *Deleted

## 2022-04-29 DIAGNOSIS — E1165 Type 2 diabetes mellitus with hyperglycemia: Secondary | ICD-10-CM

## 2022-04-29 NOTE — Telephone Encounter (Signed)
Tried submitting prior Josem Kaufmann but got the message that it was approved.  This request has been approved using information available on the patient's profile. [id number];Review Type:Prior Auth;Coverage Start Date:03/30/2022;Coverage End Date:04/29/2023;

## 2022-04-29 NOTE — Telephone Encounter (Signed)
Pt called stating there is a shortage on Ozempic and she is not able to get it currently.  She wasnts to know if there is another injectable she can be prescribed to jumpstart getting her A1C down. She does not want a pill form, only injectable.  Pharmacy cannot advise when Ozempic will be available.  Please advise.

## 2022-04-30 MED ORDER — OZEMPIC (0.25 OR 0.5 MG/DOSE) 2 MG/1.5ML ~~LOC~~ SOPN: PEN_INJECTOR | SUBCUTANEOUS | 3 refills | Status: DC

## 2022-04-30 NOTE — Telephone Encounter (Signed)
Pt does not want a paper script and would like it sent to [facility].   [facility] [address] [place], [place] [address]

## 2022-04-30 NOTE — Telephone Encounter (Signed)
Rx sent 

## 2022-05-01 NOTE — Telephone Encounter (Signed)
Pt stated that ozempic is $700 OOP. She would like to know if ins has alternatives or what else can be done so she can start rx. Please advise.

## 2022-05-02 ENCOUNTER — Other Ambulatory Visit: Payer: Self-pay | Admitting: Family Medicine

## 2022-05-05 ENCOUNTER — Encounter: Payer: Self-pay | Admitting: Gastroenterology

## 2022-05-07 ENCOUNTER — Other Ambulatory Visit: Payer: Self-pay | Admitting: Family Medicine

## 2022-05-07 DIAGNOSIS — I10 Essential (primary) hypertension: Secondary | ICD-10-CM

## 2022-05-07 DIAGNOSIS — E785 Hyperlipidemia, unspecified: Secondary | ICD-10-CM

## 2022-05-07 DIAGNOSIS — E1165 Type 2 diabetes mellitus with hyperglycemia: Secondary | ICD-10-CM

## 2022-05-07 NOTE — Telephone Encounter (Signed)
Patient advised of note below.  She will try and call insurance again to see what they cover and prices.  Also advised to do and application for patient assistance for norvo nordisk and write a letter of how much it costs and that she would like to stay on this medication.

## 2022-05-14 ENCOUNTER — Ambulatory Visit (AMBULATORY_SURGERY_CENTER): Payer: Self-pay

## 2022-05-14 VITALS — Ht 64.0 in | Wt 210.0 lb

## 2022-05-14 DIAGNOSIS — Z1211 Encounter for screening for malignant neoplasm of colon: Secondary | ICD-10-CM

## 2022-05-14 MED ORDER — NA SULFATE-K SULFATE-MG SULF 17.5-3.13-1.6 GM/177ML PO SOLN: 1.0000 | Freq: Once | ORAL | 0 refills | Status: AC

## 2022-05-14 NOTE — Progress Notes (Signed)
No egg or soy allergy known to patient  No issues known to pt with past sedation with any surgeries or procedures Patient denies ever being told they had issues or difficulty with intubation  No FH of Malignant Hyperthermia Pt is not on diet pills Pt is not on  home 02  Pt is not on blood thinners  Pt denies issues with constipation  No A fib or A flutter Have any cardiac testing pending--no Pt instructed to use Singlecare.com or GoodRx for a price reduction on prep   Patient's chart was not reviewed by Osvaldo Angst CNRA prior to previsit but is  patient appropriate for the [facility].  Previsit completed and red dot placed by patient's name on their procedure day (on provider's schedule).

## 2022-05-16 ENCOUNTER — Ambulatory Visit (INDEPENDENT_AMBULATORY_CARE_PROVIDER_SITE_OTHER): Payer: 59 | Admitting: Psychology

## 2022-05-16 DIAGNOSIS — F4323 Adjustment disorder with mixed anxiety and depressed mood: Secondary | ICD-10-CM

## 2022-05-16 NOTE — Progress Notes (Signed)
[facility] Counselor/Therapist Progress Note  Patient ID: Jasmine Hinton, MRN: [id number],    Date: 05/16/2022  Time Spent: 12:00pm -12:55pm    55 minutes   Treatment Type: Individual Therapy  Reported Symptoms: frustration  Mental Status Exam: Appearance:  Casual     Behavior: Appropriate  Motor: Normal  Speech/Language:  Normal Rate  Affect: Appropriate  Mood: normal  Thought process: normal  Thought content:   WNL  Sensory/Perceptual disturbances:   WNL  Orientation: oriented to person, place, time/date, and situation  Attention: Good  Concentration: Good  Memory: WNL  Fund of knowledge:  Good  Insight:   Good  Judgment:  Good  Impulse Control: Good   Risk Assessment: Danger to Self:  No Self-injurious Behavior: No Danger to Others: No Duty to Warn:no Physical Aggression / Violence:No  Access to Firearms a concern: No  Gang Involvement:No   Subjective: Pt present for face-to-face initial assessment update via Webex.   Pt consented to telehealth video session due to COVID-19 pandemic.    Location of pt: work Location of therapist: home office. Pt talked about having a conversation with her sister about her mother's ex boyfriend.   Pt's sister talked about what a terrible person her mother's ex boyfriend was.   Pt and sister connected on the shared experience of that negative relationship. Pt talked about her health.  She is having trouble controlling her blood sugar.   She is trying to improve her eating habits.   Pt also has to have her first colonoscopy which she is anxious about.   Pt talked about her relationships with her mother and grandparents.   Addressed the recent interactions and how pt responded.   Helped pt process her feelings and relationship dynamics.   Worked on self care strategies.   Provided supportive therapy.      Interventions: Cognitive Behavioral Therapy and Insight-Oriented  Diagnosis: F43.23    Plan of Care:  Recommend ongoing therapy.   Pt participated in setting treatment goals.   Plan to meet monthly.    Treatment Plan  (Treatment Plan Target Date 08/01/2022)  Client Abilities/Strengths   Pt is bright, engaging, and motivated for therapy.  Client Treatment Preferences   Individual therapy.  Client Statement of Needs   Improve copings skills and have a safe place to talk about issues.   Symptoms   Depressed or irritable mood. Excessive and/or unrealistic worry that is difficult to control occurring more days than not for at least 6 months about a number of events or activities.  Problems Addressed   Unipolar Depression, Anxiety Goals  1. Alleviate depressive symptoms and return to previous level of effective functioning.  2. Appropriately grieve the loss in order to normalize mood and to return to previously adaptive level of functioning.  Objective  Learn and implement behavioral strategies to overcome depression. Target Date: 2022-08-01 Frequency: monthly  Progress: 30 Modality: individual  Related Interventions  Assist the client in developing skills that increase the likelihood of deriving pleasure from behavioral activation (e.g., assertiveness skills, developing an exercise plan, less internal/more external focus, increased social involvement); reinforce success. Engage the client in "behavioral activation," increasing his/her activity level and contact with sources of reward, while identifying processes that inhibit activation. use behavioral techniques such as instruction, rehearsal, role-playing, role reversal, as needed, to facilitate activity in the client's daily life; reinforce success. 3. Develop healthy interpersonal relationships that lead to the alleviation and help prevent the relapse of depression.  4.  Develop healthy thinking patterns and beliefs about self, others, and the world that lead to the alleviation and help prevent the relapse of depression.  5. Enhance  ability to effectively cope with the full variety of life's worries and anxieties.  6. Learn and implement coping skills that result in a reduction of anxiety and worry, and improved daily functioning.  Objective  Learn and implement problem-solving strategies for realistically addressing worries. Target Date: 2022-08-01 Frequency: monthly  Progress: 30 Modality: individual  Related Interventions  Assign the client a homework exercise in which he/she problem-solves a current problem (see Mastery of Your Anxiety and Worry: Workbook by Adora Fridge and Eliot Ford or Generalized Anxiety Disorder by Eather Colas, and Eliot Ford); review, reinforce success, and provide corrective feedback toward improvement. Teach the client problem-solving strategies involving specifically defining a problem, generating options for addressing it, evaluating the pros and cons of each option, selecting and implementing an optional action, and reevaluating and refining the action. Objective  Learn and implement calming skills to reduce overall anxiety and manage anxiety symptoms. Target Date: 2022-08-01 Frequency: monthly  Progress: 30 Modality: individual  Related Interventions  Assign the client to read about progressive muscle relaxation and other calming strategies in relevant books or treatment manuals (e.g., Progressive Relaxation Training by Leroy Kennedy; Mastery of Your Anxiety and Worry: Workbook by Beckie Busing). Assign the client homework each session in which he/she practices relaxation exercises daily, gradually applying them progressively from non-anxiety-provoking to anxiety-provoking situations; review and reinforce success while providing corrective feedback toward improvement. Teach the client calming/relaxation skills (e.g., applied relaxation, progressive muscle relaxation, cue controlled relaxation; mindful breathing; biofeedback) and how to discriminate better between relaxation and tension;  teach the client how to apply these skills to his/her daily life. 7. Recognize, accept, and cope with feelings of depression.  8. Reduce overall frequency, intensity, and duration of the anxiety so that daily functioning is not impaired.  9. Resolve the core conflict that is the source of anxiety.  10. Stabilize anxiety level while increasing ability to function on a daily basis.  Diagnosis   F43.23  Conditions For Discharge  Achievement of treatment goals and objectives   Clint Bolder, LCSW

## 2022-05-20 ENCOUNTER — Encounter: Payer: Self-pay | Admitting: Gastroenterology

## 2022-05-24 ENCOUNTER — Other Ambulatory Visit: Payer: Self-pay | Admitting: Family Medicine

## 2022-05-24 DIAGNOSIS — E1169 Type 2 diabetes mellitus with other specified complication: Secondary | ICD-10-CM

## 2022-05-29 ENCOUNTER — Ambulatory Visit (AMBULATORY_SURGERY_CENTER): Payer: 59 | Admitting: Gastroenterology

## 2022-05-29 ENCOUNTER — Encounter: Payer: Self-pay | Admitting: Gastroenterology

## 2022-05-29 VITALS — BP 105/55 | HR 76 | Temp 97.5°F | Resp 10 | Ht 65.0 in | Wt 210.0 lb

## 2022-05-29 DIAGNOSIS — D122 Benign neoplasm of ascending colon: Secondary | ICD-10-CM

## 2022-05-29 DIAGNOSIS — Z1211 Encounter for screening for malignant neoplasm of colon: Secondary | ICD-10-CM | POA: Diagnosis present

## 2022-05-29 MED ORDER — SODIUM CHLORIDE 0.9 % IV SOLN: 500.0000 mL | Freq: Once | INTRAVENOUS | Status: DC

## 2022-05-29 NOTE — Progress Notes (Signed)
Called to room to assist during endoscopic procedure.  Patient ID and intended procedure confirmed with present staff. Received instructions for my participation in the procedure from the performing physician.Called to room to assist during endoscopic procedure.  Patient ID and intended procedure confirmed with present staff. Received instructions for my participation in the procedure from the performing physician. 

## 2022-05-29 NOTE — Patient Instructions (Signed)
Information on polyps given to you today.  Await pathology results.  Resume previous diet and medications.    YOU HAD AN ENDOSCOPIC PROCEDURE TODAY AT THE  ENDOSCOPY CENTER:   Refer to the procedure report that was given to you for any specific questions about what was found during the examination.  If the procedure report does not answer your questions, please call your gastroenterologist to clarify.  If you requested that your care partner not be given the details of your procedure findings, then the procedure report has been included in a sealed envelope for you to review at your convenience later.  YOU SHOULD EXPECT: Some feelings of bloating in the abdomen. Passage of more gas than usual.  Walking can help get rid of the air that was put into your GI tract during the procedure and reduce the bloating. If you had a lower endoscopy (such as a colonoscopy or flexible sigmoidoscopy) you may notice spotting of blood in your stool or on the toilet paper. If you underwent a bowel prep for your procedure, you may not have a normal bowel movement for a few days.  Please Note:  You might notice some irritation and congestion in your nose or some drainage.  This is from the oxygen used during your procedure.  There is no need for concern and it should clear up in a day or so.  SYMPTOMS TO REPORT IMMEDIATELY:  Following lower endoscopy (colonoscopy or flexible sigmoidoscopy):  Excessive amounts of blood in the stool  Significant tenderness or worsening of abdominal pains  Swelling of the abdomen that is new, acute  Fever of 100F or higher  For urgent or emergent issues, a gastroenterologist can be reached at any hour by calling [phone]. Do not use MyChart messaging for urgent concerns.    DIET:  We do recommend a small meal at first, but then you may proceed to your regular diet.  Drink plenty of fluids but you should avoid alcoholic beverages for 24 hours.  ACTIVITY:  You should  plan to take it easy for the rest of today and you should NOT DRIVE or use heavy machinery until tomorrow (because of the sedation medicines used during the test).    FOLLOW UP: Our staff will call the number listed on your records the next business day following your procedure.  We will call around 7:15- 8:00 am to check on you and address any questions or concerns that you may have regarding the information given to you following your procedure. If we do not reach you, we will leave a message.     If any biopsies were taken you will be contacted by phone or by letter within the next 1-3 weeks.  Please call us at [phone] if you have not heard about the biopsies in 3 weeks.    SIGNATURES/CONFIDENTIALITY: You and/or your care partner have signed paperwork which will be entered into your electronic medical record.  These signatures attest to the fact that that the information above on your After Visit Summary has been reviewed and is understood.  Full responsibility of the confidentiality of this discharge information lies with you and/or your care-partner.  

## 2022-05-29 NOTE — Progress Notes (Signed)
[facility] History and Physical   Primary Care Physician:  Carollee Herter, Alferd Apa, DO   Reason for Procedure:   Colon cancer screening  Plan:    Screening colonoscopy     HPI: Jasmine Hinton is a 51 y.o. female undergoing initial average risk screening colonoscopy.  She has no family history of colon cancer (other than grandfather, 98s) and no chronic GI symptoms.    Past Medical History:  Diagnosis Date   Anxiety    Asthma    Depression    Diabetes mellitus    Hypertension     Past Surgical History:  Procedure Laterality Date   TONSILLECTOMY      Prior to Admission medications   Medication Sig Start Date End Date Taking? Authorizing Provider  atorvastatin (LIPITOR) 20 MG tablet Take 1 tablet by mouth once daily 05/26/22  Yes Lowne Chase, Kendrick Fries R, DO  citalopram (CELEXA) 20 MG tablet Take 1 tablet (20 mg total) by mouth daily. 04/28/22  Yes Roma Schanz R, DO  glucose blood (ONETOUCH VERIO) test strip Use as instructed twice a day.  E11.9 09/30/16  Yes Roma Schanz R, DO  Insulin Pen Needle (BD PEN NEEDLE NANO 2ND GEN) 32G X 4 MM MISC Use as directed with Victoza once a day 12/02/18  Yes Lowne Lyndal Pulley R, DO  lisinopril-hydrochlorothiazide (ZESTORETIC) 10-12.5 MG tablet Take 1 tablet by mouth daily. 04/28/22  Yes Roma Schanz R, DO  metFORMIN (GLUCOPHAGE) 1000 MG tablet Take 1 tablet (1,000 mg total) by mouth 2 (two) times daily with a meal. 04/28/22  Yes Lowne Chase, Yvonne R, DO  montelukast (SINGULAIR) 10 MG tablet Take 1 tablet (10 mg total) by mouth at bedtime. 04/28/22  Yes Roma Schanz R, DO  albuterol (PROAIR HFA) 108 (90 Base) MCG/ACT inhaler Inhale 2 puffs into the lungs every 6 (six) hours as needed for wheezing or shortness of breath. 04/28/22   Ann Held, DO  clonazePAM (KLONOPIN) 1 MG tablet 1 po tid prn Patient not taking: Reported on 05/14/2022 04/28/22   Carollee Herter, Alferd Apa, DO  fluticasone (FLONASE)  50 MCG/ACT nasal spray Place 2 sprays into both nostrils daily. Patient not taking: Reported on 05/14/2022 04/28/22   Carollee Herter, Alferd Apa, DO  ONETOUCH DELICA LANCETS FINE MISC Use as instructed twice a day.  E11.9 Patient not taking: Reported on 05/14/2022 09/30/16   Ann Held, DO  Semaglutide,0.25 or 0.'5MG'$ /DOS, (OZEMPIC, 0.25 OR 0.5 MG/DOSE,) 2 MG/1.5ML SOPN 0.25 mg weekly x4 then increase to 0.5 mg Patient not taking: Reported on 05/14/2022 04/30/22   Ann Held, DO    Current Outpatient Medications  Medication Sig Dispense Refill   atorvastatin (LIPITOR) 20 MG tablet Take 1 tablet by mouth once daily 30 tablet 0   citalopram (CELEXA) 20 MG tablet Take 1 tablet (20 mg total) by mouth daily. 90 tablet 3   glucose blood (ONETOUCH VERIO) test strip Use as instructed twice a day.  E11.9 100 each 1   Insulin Pen Needle (BD PEN NEEDLE NANO 2ND GEN) 32G X 4 MM MISC Use as directed with Victoza once a day 100 each 3   lisinopril-hydrochlorothiazide (ZESTORETIC) 10-12.5 MG tablet Take 1 tablet by mouth daily. 90 tablet 3   metFORMIN (GLUCOPHAGE) 1000 MG tablet Take 1 tablet (1,000 mg total) by mouth 2 (two) times daily with a meal. 180 tablet 1   montelukast (SINGULAIR) 10 MG tablet Take 1 tablet (10  mg total) by mouth at bedtime. 90 tablet 3   albuterol (PROAIR HFA) 108 (90 Base) MCG/ACT inhaler Inhale 2 puffs into the lungs every 6 (six) hours as needed for wheezing or shortness of breath. 1 each 5   clonazePAM (KLONOPIN) 1 MG tablet 1 po tid prn (Patient not taking: Reported on 05/14/2022) 90 tablet 1   fluticasone (FLONASE) 50 MCG/ACT nasal spray Place 2 sprays into both nostrils daily. (Patient not taking: Reported on 05/14/2022) 16 g 5   ONETOUCH DELICA LANCETS FINE MISC Use as instructed twice a day.  E11.9 (Patient not taking: Reported on 05/14/2022) 100 each 1   Semaglutide,0.25 or 0.'5MG'$ /DOS, (OZEMPIC, 0.25 OR 0.5 MG/DOSE,) 2 MG/1.5ML SOPN 0.25 mg weekly x4 then increase to  0.5 mg (Patient not taking: Reported on 05/14/2022) 1.5 mL 3   Current Facility-Administered Medications  Medication Dose Route Frequency Provider Last Rate Last Admin   0.9 %  sodium chloride infusion  500 mL Intravenous Once Daryel November, MD        Allergies as of 05/29/2022   (No Known Allergies)    Family History  Problem Relation Age of Onset   Hypertension Mother    Thyroid disease Mother    Diabetes Mother    Diabetes Father    COPD Father    Colon polyps Maternal Grandfather    Colon cancer Maternal Grandfather 85   Diabetes Other    Hypertension Other    Depression Other    Esophageal cancer Neg Hx    Rectal cancer Neg Hx    Stomach cancer Neg Hx     Social History   Socioeconomic History   Marital status: Married    Spouse name: william   Number of children: Not on file   Years of education: Not on file   Highest education level: Not on file  Occupational History   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    Comment: one partner currently  Other Topics Concern   Not on file  Social History Narrative   Not on file   Social Determinants of Health   Financial Resource Strain: Not on file  Food Insecurity: Not on file  Transportation Needs: Not on file  Physical Activity: Not on file  Stress: Not on file  Social Connections: Not on file  Intimate Partner Violence: Not on file    Review of Systems:  All other review of systems negative except as mentioned in the HPI.  Physical Exam: Vital signs BP 131/69   Pulse 81   Temp (!) 97.5 F (36.4 C)   Ht '5\' 5"'$  (1.651 m)   Wt 210 lb (95.3 kg)   LMP 05/27/2022   SpO2 100%   BMI 34.95 kg/m   General:   Alert,  Well-developed,  well-nourished, pleasant and cooperative in NAD Airway:  Mallampati 1 Lungs:  Clear throughout to auscultation.   Heart:  Regular rate and rhythm; no murmurs, clicks, rubs,  or gallops. Abdomen:  Soft, nontender and nondistended. Normal bowel sounds.   Neuro/Psych:  Normal mood and affect. A and O x 3   Jasmine Alkhatib E. Candis Schatz, MD [facility]

## 2022-05-29 NOTE — Progress Notes (Signed)
Pt resting comfortably. VSS. Airway intact. SBAR complete to RN. All questions answered.   

## 2022-05-29 NOTE — Op Note (Signed)
[facility] Patient Name: Jasmine Hinton Procedure Date: 05/29/2022 10:19 AM MRN: [id number] Endoscopist: Nicki Reaper E. Candis Schatz , MD, [id number] Age: 51 Referring MD:  Date of Birth: Oct 26, 1970 Gender: Female Account #: [id number] Procedure:                Colonoscopy Indications:              Screening for colorectal malignant neoplasm, This                            is the patient's first colonoscopy Medicines:                Monitored Anesthesia Care Procedure:                Pre-Anesthesia Assessment:                           - Prior to the procedure, a History and Physical                            was performed, and patient medications and                            allergies were reviewed. The patient's tolerance of                            previous anesthesia was also reviewed. The risks                            and benefits of the procedure and the sedation                            options and risks were discussed with the patient.                            All questions were answered, and informed consent                            was obtained. Prior Anticoagulants: The patient has                            taken no anticoagulant or antiplatelet agents. ASA                            Grade Assessment: III - A patient with severe                            systemic disease. After reviewing the risks and                            benefits, the patient was deemed in satisfactory                            condition to undergo the procedure.  After obtaining informed consent, the colonoscope                            was passed under direct vision. Throughout the                            procedure, the patient's blood pressure, pulse, and                            oxygen saturations were monitored continuously. The                            CF [id number] #[id number] was introduced through the anus                            and  advanced to the the cecum, identified by                            appendiceal orifice and ileocecal valve. The                            colonoscopy was performed without difficulty. The                            patient tolerated the procedure well. The quality                            of the bowel preparation was good. The ileocecal                            valve, appendiceal orifice, and rectum were                            photographed. The bowel preparation used was SUPREP                            via split dose instruction. Scope In: 10:27:32 AM Scope Out: 10:42:30 AM Scope Withdrawal Time: 0 hours 10 minutes 59 seconds  Total Procedure Duration: 0 hours 14 minutes 58 seconds  Findings:                 The perianal and digital rectal examinations were                            normal. Pertinent negatives include normal                            sphincter tone and no palpable rectal lesions.                           A 3 mm polyp was found in the ascending colon. The                            polyp was sessile. The polyp was removed  with a                            cold snare. Resection and retrieval were complete.                            Estimated blood loss was minimal.                           The exam was otherwise normal throughout the                            examined colon.                           The retroflexed view of the distal rectum and anal                            verge was normal and showed no anal or rectal                            abnormalities. Complications:            No immediate complications. Estimated Blood Loss:     Estimated blood loss was minimal. Impression:               - One 3 mm polyp in the ascending colon, removed                            with a cold snare. Resected and retrieved.                           - The distal rectum and anal verge are normal on                            retroflexion view. Recommendation:            - Patient has a contact number available for                            emergencies. The signs and symptoms of potential                            delayed complications were discussed with the                            patient. Return to normal activities tomorrow.                            Written discharge instructions were provided to the                            patient.                           - Resume previous diet.                           -  Continue present medications.                           - Await pathology results.                           - Repeat colonoscopy (date not yet determined) for                            surveillance based on pathology results. Nicolai Labonte E. Candis Schatz, MD 05/29/2022 10:47:06 AM This report has been signed electronically.

## 2022-05-30 ENCOUNTER — Telehealth: Payer: Self-pay

## 2022-05-30 NOTE — Telephone Encounter (Signed)
  Follow up Call-     05/29/2022    9:00 AM  Call back number  Post procedure Call Back phone  # [phone]  Permission to leave phone message Yes     Patient questions:  Do you have a fever, pain , or abdominal swelling? No. Pain Score  0 *  Have you tolerated food without any problems? Yes.    Have you been able to return to your normal activities? Yes.    Do you have any questions about your discharge instructions: Diet   No. Medications  No. Follow up visit  No.  Do you have questions or concerns about your Care? No.  Actions: * If pain score is 4 or above: No action needed, pain <4.

## 2022-06-20 ENCOUNTER — Other Ambulatory Visit: Payer: Self-pay | Admitting: Family Medicine

## 2022-06-20 DIAGNOSIS — E785 Hyperlipidemia, unspecified: Secondary | ICD-10-CM

## 2022-07-30 ENCOUNTER — Telehealth: Payer: 59 | Admitting: Nurse Practitioner

## 2022-07-30 DIAGNOSIS — J0140 Acute pansinusitis, unspecified: Secondary | ICD-10-CM | POA: Diagnosis not present

## 2022-07-30 MED ORDER — AMOXICILLIN-POT CLAVULANATE 875-125 MG PO TABS: 1.0000 | ORAL_TABLET | Freq: Two times a day (BID) | ORAL | 0 refills | Status: AC

## 2022-07-30 MED ORDER — FLUTICASONE PROPIONATE 50 MCG/ACT NA SUSP: 2.0000 | Freq: Every day | NASAL | 6 refills | Status: DC

## 2022-07-30 NOTE — Progress Notes (Signed)
E-Visit for Sinus Problems  We are sorry that you are not feeling well.  Here is how we plan to help!  Based on what you have shared with me it looks like you have sinusitis.  Sinusitis is inflammation and infection in the sinus cavities of the head.  Based on your presentation I believe you most likely have Acute Bacterial Sinusitis.  This is an infection caused by bacteria and is treated with antibiotics. I have prescribed Augmentin '875mg'$ /'125mg'$  one tablet twice daily with food, for 7 days. You may use an oral decongestant such as Mucinex D or if you have glaucoma or high blood pressure use plain Mucinex. Saline nasal spray help and can safely be used as often as needed for congestion.  If you develop worsening sinus pain, fever or notice severe headache and vision changes, or if symptoms are not better after completion of antibiotic, please schedule an appointment with a health care provider.    We will also send in a prescription for Flonase to help with your symptoms as well  Sinus infections are not as easily transmitted as other respiratory infection, however we still recommend that you avoid close contact with loved ones, especially the very young and elderly.  Remember to wash your hands thoroughly throughout the day as this is the number one way to prevent the spread of infection!  Home Care: Only take medications as instructed by your medical team. Complete the entire course of an antibiotic. Do not take these medications with alcohol. A steam or ultrasonic humidifier can help congestion.  You can place a towel over your head and breathe in the steam from hot water coming from a faucet. Avoid close contacts especially the very young and the elderly. Cover your mouth when you cough or sneeze. Always remember to wash your hands.  Get Help Right Away If: You develop worsening fever or sinus pain. You develop a severe head ache or visual changes. Your symptoms persist after you have  completed your treatment plan.  Make sure you Understand these instructions. Will watch your condition. Will get help right away if you are not doing well or get worse.  Thank you for choosing an e-visit.  Your e-visit answers were reviewed by a board certified advanced clinical practitioner to complete your personal care plan. Depending upon the condition, your plan could have included both over the counter or prescription medications.  Please review your pharmacy choice. Make sure the pharmacy is open so you can pick up prescription now. If there is a problem, you may contact your provider through [employer] and have the prescription routed to another pharmacy.  Your safety is important to [place]. If you have drug allergies check your prescription carefully.   For the next 24 hours you can use MyChart to ask questions about today's visit, request a non-urgent call back, or ask for a work or school excuse. You will get an email in the next two days asking about your experience. I hope that your e-visit has been valuable and will speed your recovery.   Meds ordered this encounter  Medications   amoxicillin-clavulanate (AUGMENTIN) 875-125 MG tablet    Sig: Take 1 tablet by mouth 2 (two) times daily for 7 days. Take with food    Dispense:  14 tablet    Refill:  0   fluticasone (FLONASE) 50 MCG/ACT nasal spray    Sig: Place 2 sprays into both nostrils daily.    Dispense:  16 g  Refill:  6     I spent approximately 5 minutes reviewing the patient's history, current symptoms and coordinating their care today.

## 2022-08-04 ENCOUNTER — Ambulatory Visit (INDEPENDENT_AMBULATORY_CARE_PROVIDER_SITE_OTHER): Payer: 59 | Admitting: Psychology

## 2022-08-04 DIAGNOSIS — F4323 Adjustment disorder with mixed anxiety and depressed mood: Secondary | ICD-10-CM | POA: Diagnosis not present

## 2022-08-04 NOTE — Progress Notes (Signed)
[facility] Counselor Initial Adult Exam  Name: Jasmine Hinton Date: 08/04/2022 MRN: [id number] DOB: 19-Nov-1970 PCP: Ann Held, DO  Time spent: 12:00pm-12:50pm  50 minutes  Guardian/Payee:  [name] requested: No   Reason for Visit /Presenting Problem: Subjective:  Pt present for face-to-face initial assessment update via Webex.   Pt consented to telehealth video session due to COVID-19 pandemic.    Location of pt: work Location of therapist: home office. Pt wants to continue therapy for "maintenance" and to have a place to talk.    Pt states she is dealing with stress.  Pt's job has some stress and pt has family stress as well.  Addressed the family dynamics.  Reviewed pt's treatment plan for annual update.   Updated pt's treatment plan and IA.   Pt participated in setting treatment goals.   Plan to continue to meet monthly.    Mental Status Exam: Appearance:   Casual     Behavior:  Appropriate  Motor:  Normal  Speech/Language:   Normal Rate  Affect:  Appropriate  Mood:  normal  Thought process:  normal  Thought content:    WNL  Sensory/Perceptual disturbances:    WNL  Orientation:  oriented to person, place, time/date, and situation  Attention:  Good  Concentration:  Good  Memory:  WNL  Fund of knowledge:   Good  Insight:    Good  Judgment:   Good  Impulse Control:  Good    Reported Symptoms:  stress  Risk Assessment: Danger to Self:  No Self-injurious Behavior: No Danger to Others: No Duty to Warn:no Physical Aggression / Violence:No  Access to Firearms a concern: No  Gang Involvement:No  Patient / guardian was educated about steps to take if suicide or homicide risk level increases between visits: no While future psychiatric events cannot be accurately predicted, the patient does not currently require acute inpatient psychiatric care and does not currently meet [facility] involuntary commitment criteria.  Substance  Abuse History: Current substance abuse: No     Past Psychiatric History:   No previous psychological problems have been observed Outpatient Providers:n/a History of Psych Hospitalization: No  Psychological Testing:  n/a    Abuse History:  Victim of: No.,  none    Report needed: No. Victim of Neglect:No. Perpetrator of  n/a   Witness / Exposure to Domestic Violence: No   Protective Services Involvement: No  Witness to [employer] Violence:  No   Family History:  Family History  Problem Relation Age of Onset   Hypertension Mother    Thyroid disease Mother    Diabetes Mother    Diabetes Father    COPD Father    Colon polyps Maternal Grandfather    Colon cancer Maternal Grandfather 85   Diabetes Other    Hypertension Other    Depression Other    Esophageal cancer Neg Hx    Rectal cancer Neg Hx    Stomach cancer Neg Hx     Living situation: the patient lives with her spouse.  Pt grew up with mother.  Her parents got divorced when pt was 20 yrs old.  Pt had a stepfather who she describes as "great to her".   Pt has a Hollen[place]amp half sister and an older 52.    Pt has an older half sister and a Smits half brother from her father.   Pt and stepsister are very close.   Pt did not see her father  very much when she was growing up.   Family history of mental illness:  maternal grandmother had depression. Pt states her family was not affectionate.  Pt knew her mother loved her but she rarely was told that or hugged.  Pt states that her mother was a perfect mom for her sister but not a great mom for her.   Pt has worked on the relationship with her mother in adulthood.   No childhood abuse.    Sexual Orientation: Straight  Relationship Status: married  Name of spouse / other:Jasmine Hinton If a parent, number of children / ages:n/a  Support Systems: spouse friends parents  Financial Stress:  No   Income/Employment/Disability: Employment Pt has a job at [employer] center and  has been there for 2 years. Military Service: No   Educational History: Education: some college  Religion/Sprituality/World View: N/a  Any cultural differences that may affect / interfere with treatment:  not applicable   Recreation/Hobbies: reading  Stressors: Marital or family conflict   Occupational concerns    Strengths: Supportive Relationships, Hopefulness, Self Advocate, and Able to Communicate Effectively  Barriers:  none   Legal History: Pending legal issue / charges: The patient has no significant history of legal issues. History of legal issue / charges:  none  Medical History/Surgical History: reviewed Past Medical History:  Diagnosis Date   Anxiety    Asthma    Depression    Diabetes mellitus    Hypertension     Past Surgical History:  Procedure Laterality Date   TONSILLECTOMY      Medications: Current Outpatient Medications  Medication Sig Dispense Refill   albuterol (PROAIR HFA) 108 (90 Base) MCG/ACT inhaler Inhale 2 puffs into the lungs every 6 (six) hours as needed for wheezing or shortness of breath. 1 each 5   amoxicillin-clavulanate (AUGMENTIN) 875-125 MG tablet Take 1 tablet by mouth 2 (two) times daily for 7 days. Take with food 14 tablet 0   atorvastatin (LIPITOR) 20 MG tablet Take 1 tablet by mouth once daily 90 tablet 1   citalopram (CELEXA) 20 MG tablet Take 1 tablet (20 mg total) by mouth daily. 90 tablet 3   clonazePAM (KLONOPIN) 1 MG tablet 1 po tid prn (Patient not taking: Reported on 05/14/2022) 90 tablet 1   fluticasone (FLONASE) 50 MCG/ACT nasal spray Place 2 sprays into both nostrils daily. (Patient not taking: Reported on 05/14/2022) 16 g 5   fluticasone (FLONASE) 50 MCG/ACT nasal spray Place 2 sprays into both nostrils daily. 16 g 6   glucose blood (ONETOUCH VERIO) test strip Use as instructed twice a day.  E11.9 100 each 1   Insulin Pen Needle (BD PEN NEEDLE NANO 2ND GEN) 32G X 4 MM MISC Use as directed with Victoza once a day 100  each 3   lisinopril-hydrochlorothiazide (ZESTORETIC) 10-12.5 MG tablet Take 1 tablet by mouth daily. 90 tablet 3   metFORMIN (GLUCOPHAGE) 1000 MG tablet Take 1 tablet (1,000 mg total) by mouth 2 (two) times daily with a meal. 180 tablet 1   montelukast (SINGULAIR) 10 MG tablet Take 1 tablet (10 mg total) by mouth at bedtime. 90 tablet 3   ONETOUCH DELICA LANCETS FINE MISC Use as instructed twice a day.  E11.9 (Patient not taking: Reported on 05/14/2022) 100 each 1   Semaglutide,0.25 or 0.'5MG'$ /DOS, (OZEMPIC, 0.25 OR 0.5 MG/DOSE,) 2 MG/1.5ML SOPN 0.25 mg weekly x4 then increase to 0.5 mg (Patient not taking: Reported on 05/14/2022) 1.5 mL 3   No current  facility-administered medications for this visit.    No Known Allergies  Diagnoses:  F43.23  Plan of Care: Recommend ongoing therapy.   Pt participated in setting treatment goals.   Plan to meet monthly.   Treatment Plan    Client Abilities/Strengths   Pt is bright, engaging, and motivated for therapy.  Client Treatment Preferences   Individual therapy.  Client Statement of Needs   Improve copings skills and have a safe place to talk about issues.   Symptoms   Depressed or irritable mood. Excessive and/or unrealistic worry that is difficult to control occurring more days than not for at least 6 months about a number of events or activities.  Problems Addressed   Unipolar Depression, Anxiety Goals  1. Alleviate depressive symptoms and return to previous level of effective functioning.  2. Appropriately grieve the loss in order to normalize mood and to return to previously adaptive level of functioning.  Objective  Learn and implement behavioral strategies to overcome depression. Target Date: 2023-08-05 Frequency: monthly  Progress: 50 Modality: individual  Related Interventions  Assist the client in developing skills that increase the likelihood of deriving pleasure from behavioral activation (e.g., assertiveness skills,  developing an exercise plan, less internal/more external focus, increased social involvement); reinforce success. Engage the client in "behavioral activation," increasing his/her activity level and contact with sources of reward, while identifying processes that inhibit activation. use behavioral techniques such as instruction, rehearsal, role-playing, role reversal, as needed, to facilitate activity in the client's daily life; reinforce success. 3. Develop healthy interpersonal relationships that lead to the alleviation and help prevent the relapse of depression.  4. Develop healthy thinking patterns and beliefs about self, others, and the world that lead to the alleviation and help prevent the relapse of depression.  5. Enhance ability to effectively cope with the full variety of life's worries and anxieties.  6. Learn and implement coping skills that result in a reduction of anxiety and worry, and improved daily functioning.  Objective  Learn and implement problem-solving strategies for realistically addressing worries. Target Date: 2023-08-05 Frequency: monthly  Progress: 50 Modality: individual  Related Interventions  Assign the client a homework exercise in which he/she problem-solves a current problem (see Mastery of Your Anxiety and Worry: Workbook by Adora Fridge and Eliot Ford or Generalized Anxiety Disorder by Eather Colas, and Eliot Ford); review, reinforce success, and provide corrective feedback toward improvement. Teach the client problem-solving strategies involving specifically defining a problem, generating options for addressing it, evaluating the pros and cons of each option, selecting and implementing an optional action, and reevaluating and refining the action. Objective  Learn and implement calming skills to reduce overall anxiety and manage anxiety symptoms. Target Date: 2023-08-05 Frequency: monthly  Progress: 50 Modality: individual  Related Interventions  Assign the client to  read about progressive muscle relaxation and other calming strategies in relevant books or treatment manuals (e.g., Progressive Relaxation Training by Leroy Kennedy; Mastery of Your Anxiety and Worry: Workbook by Beckie Busing). Assign the client homework each session in which he/she practices relaxation exercises daily, gradually applying them progressively from non-anxiety-provoking to anxiety-provoking situations; review and reinforce success while providing corrective feedback toward improvement. Teach the client calming/relaxation skills (e.g., applied relaxation, progressive muscle relaxation, cue controlled relaxation; mindful breathing; biofeedback) and how to discriminate better between relaxation and tension; teach the client how to apply these skills to his/her daily life. 7. Recognize, accept, and cope with feelings of depression.  8. Reduce overall frequency, intensity, and duration of  the anxiety so that daily functioning is not impaired.  9. Resolve the core conflict that is the source of anxiety.  10. Stabilize anxiety level while increasing ability to function on a daily basis.  Diagnosis    F43.23   Conditions For Discharge  Achievement of treatment goals and objectives    Clint Bolder, LCSW

## 2022-09-05 ENCOUNTER — Ambulatory Visit (INDEPENDENT_AMBULATORY_CARE_PROVIDER_SITE_OTHER): Payer: 59 | Admitting: Psychology

## 2022-09-05 DIAGNOSIS — F4323 Adjustment disorder with mixed anxiety and depressed mood: Secondary | ICD-10-CM | POA: Diagnosis not present

## 2022-09-05 NOTE — Progress Notes (Signed)
[facility] Counselor/Therapist Progress Note  Patient ID: Jasmine Hinton, MRN: [id number],    Date: 09/05/2022  Time Spent: 12:00pm-12:55pm    55 minutes   Treatment Type: Individual Therapy  Reported Symptoms: stress  Mental Status Exam: Appearance:  Casual     Behavior: Appropriate  Motor: Normal  Speech/Language:  Normal Rate  Affect: Appropriate  Mood: normal  Thought process: normal  Thought content:   WNL  Sensory/Perceptual disturbances:   WNL  Orientation: oriented to person, place, time/date, and situation  Attention: Good  Concentration: Good  Memory: WNL  Fund of knowledge:  Good  Insight:   Good  Judgment:  Good  Impulse Control: Good   Risk Assessment: Danger to Self:  No Self-injurious Behavior: No Danger to Others: No Duty to Warn:no Physical Aggression / Violence:No  Access to Firearms a concern: No  Gang Involvement:No   Subjective: Pt present for face-to-face individual therapy via video Webex.  Pt consents to telehealth video session due to COVID 19 pandemic. Location of pt: home Location of therapist: home office.   Pt talked about "feeling frazzled lately".   She feels like things aren't going smoothly.   It frustrates her.  Addressed the issues and worked on Child psychotherapist.   Pt talked about her relationship with her husband Jasmine Hinton. They are often tired after work and therefore don't have sex as much as pt would like.  Addressed the issues and how she can communicate her needs to her husband.   Provided supportive therapy.   Interventions: Cognitive Behavioral Therapy and Insight-Oriented  Diagnosis: F43.23  Plan: Plan of Care: Recommend ongoing therapy.   Pt participated in setting treatment goals.   Plan to meet monthly.   Treatment Plan    Client Abilities/Strengths   Pt is bright, engaging, and motivated for therapy.  Client Treatment Preferences   Individual therapy.  Client Statement of Needs   Improve copings  skills and have a safe place to talk about issues.   Symptoms   Depressed or irritable mood. Excessive and/or unrealistic worry that is difficult to control occurring more days than not for at least 6 months about a number of events or activities.  Problems Addressed   Unipolar Depression, Anxiety Goals  1. Alleviate depressive symptoms and return to previous level of effective functioning.  2. Appropriately grieve the loss in order to normalize mood and to return to previously adaptive level of functioning.  Objective  Learn and implement behavioral strategies to overcome depression. Target Date: 2023-08-05 Frequency: monthly  Progress: 50 Modality: individual  Related Interventions  Assist the client in developing skills that increase the likelihood of deriving pleasure from behavioral activation (e.g., assertiveness skills, developing an exercise plan, less internal/more external focus, increased social involvement); reinforce success. Engage the client in "behavioral activation," increasing his/her activity level and contact with sources of reward, while identifying processes that inhibit activation. use behavioral techniques such as instruction, rehearsal, role-playing, role reversal, as needed, to facilitate activity in the client's daily life; reinforce success. 3. Develop healthy interpersonal relationships that lead to the alleviation and help prevent the relapse of depression.  4. Develop healthy thinking patterns and beliefs about self, others, and the world that lead to the alleviation and help prevent the relapse of depression.  5. Enhance ability to effectively cope with the full variety of life's worries and anxieties.  6. Learn and implement coping skills that result in a reduction of anxiety and worry, and improved daily functioning.  Objective  Learn and implement problem-solving strategies for realistically addressing worries. Target Date: 2023-08-05 Frequency:  monthly  Progress: 50 Modality: individual  Related Interventions  Assign the client a homework exercise in which he/she problem-solves a current problem (see Mastery of Your Anxiety and Worry: Workbook by Adora Fridge and Eliot Ford or Generalized Anxiety Disorder by Eather Colas, and Eliot Ford); review, reinforce success, and provide corrective feedback toward improvement. Teach the client problem-solving strategies involving specifically defining a problem, generating options for addressing it, evaluating the pros and cons of each option, selecting and implementing an optional action, and reevaluating and refining the action. Objective  Learn and implement calming skills to reduce overall anxiety and manage anxiety symptoms. Target Date: 2023-08-05 Frequency: monthly  Progress: 50 Modality: individual  Related Interventions  Assign the client to read about progressive muscle relaxation and other calming strategies in relevant books or treatment manuals (e.g., Progressive Relaxation Training by Leroy Kennedy; Mastery of Your Anxiety and Worry: Workbook by Beckie Busing). Assign the client homework each session in which he/she practices relaxation exercises daily, gradually applying them progressively from non-anxiety-provoking to anxiety-provoking situations; review and reinforce success while providing corrective feedback toward improvement. Teach the client calming/relaxation skills (e.g., applied relaxation, progressive muscle relaxation, cue controlled relaxation; mindful breathing; biofeedback) and how to discriminate better between relaxation and tension; teach the client how to apply these skills to his/her daily life. 7. Recognize, accept, and cope with feelings of depression.  8. Reduce overall frequency, intensity, and duration of the anxiety so that daily functioning is not impaired.  9. Resolve the core conflict that is the source of anxiety.  10. Stabilize anxiety level while  increasing ability to function on a daily basis.  Diagnosis    F43.23   Conditions For Discharge  Achievement of treatment goals and objectives   Clint Bolder, LCSW

## 2022-10-03 ENCOUNTER — Ambulatory Visit (INDEPENDENT_AMBULATORY_CARE_PROVIDER_SITE_OTHER): Payer: 59 | Admitting: Psychology

## 2022-10-03 DIAGNOSIS — F4323 Adjustment disorder with mixed anxiety and depressed mood: Secondary | ICD-10-CM | POA: Diagnosis not present

## 2022-10-03 NOTE — Progress Notes (Signed)
[facility] Counselor/Therapist Progress Note  Patient ID: KYIAH SACA, MRN: [id number],    Date: 10/03/2022  Time Spent: 12:00pm-12:55pm    55 minutes   Treatment Type: Individual Therapy  Reported Symptoms: stress  Mental Status Exam: Appearance:  Casual     Behavior: Appropriate  Motor: Normal  Speech/Language:  Normal Rate  Affect: Appropriate  Mood: normal  Thought process: normal  Thought content:   WNL  Sensory/Perceptual disturbances:   WNL  Orientation: oriented to person, place, time/date, and situation  Attention: Good  Concentration: Good  Memory: WNL  Fund of knowledge:  Good  Insight:   Good  Judgment:  Good  Impulse Control: Good   Risk Assessment: Danger to Self:  No Self-injurious Behavior: No Danger to Others: No Duty to Warn:no Physical Aggression / Violence:No  Access to Firearms a concern: No  Gang Involvement:No   Subjective: Pt present for face-to-face individual therapy via video Webex.  Pt consents to telehealth video session due to COVID 19 pandemic. Location of pt: home Location of therapist: home office.   Pt talked about her relationship with her sister.   There has always been a competitiveness between them.  Pt has been jealous of her sister who is [place] and very attractive.  Pt let her jealousy impact their relationship.  Pt felt like her sister was favored by their mother.   Pt had a conversation with her sister about how she felt and that helped to facilitate a heartfelt exchange that helped them grow closer.   Helped pt process her feelings and family dynamics.   Pt talked about concerns about her mother.  She is worried about how her mother will do when her mother's parents die.  Pt is worried her mother will "go downhill".  Addressed pt's worries and concerns about her mother.  Provided supportive therapy.   Interventions: Cognitive Behavioral Therapy and Insight-Oriented  Diagnosis: F43.23  Plan: Plan of  Care: Recommend ongoing therapy.   Pt participated in setting treatment goals.   Plan to meet monthly.   Treatment Plan    Client Abilities/Strengths   Pt is bright, engaging, and motivated for therapy.  Client Treatment Preferences   Individual therapy.  Client Statement of Needs   Improve copings skills and have a safe place to talk about issues.   Symptoms   Depressed or irritable mood. Excessive and/or unrealistic worry that is difficult to control occurring more days than not for at least 6 months about a number of events or activities.  Problems Addressed   Unipolar Depression, Anxiety Goals  1. Alleviate depressive symptoms and return to previous level of effective functioning.  2. Appropriately grieve the loss in order to normalize mood and to return to previously adaptive level of functioning.  Objective  Learn and implement behavioral strategies to overcome depression. Target Date: 2023-08-05 Frequency: monthly  Progress: 50 Modality: individual  Related Interventions  Assist the client in developing skills that increase the likelihood of deriving pleasure from behavioral activation (e.g., assertiveness skills, developing an exercise plan, less internal/more external focus, increased social involvement); reinforce success. Engage the client in "behavioral activation," increasing his/her activity level and contact with sources of reward, while identifying processes that inhibit activation. use behavioral techniques such as instruction, rehearsal, role-playing, role reversal, as needed, to facilitate activity in the client's daily life; reinforce success. 3. Develop healthy interpersonal relationships that lead to the alleviation and help prevent the relapse of depression.  4. Develop healthy thinking patterns  and beliefs about self, others, and the world that lead to the alleviation and help prevent the relapse of depression.  5. Enhance ability to effectively cope with  the full variety of life's worries and anxieties.  6. Learn and implement coping skills that result in a reduction of anxiety and worry, and improved daily functioning.  Objective  Learn and implement problem-solving strategies for realistically addressing worries. Target Date: 2023-08-05 Frequency: monthly  Progress: 50 Modality: individual  Related Interventions  Assign the client a homework exercise in which he/she problem-solves a current problem (see Mastery of Your Anxiety and Worry: Workbook by Adora Fridge and Eliot Ford or Generalized Anxiety Disorder by Eather Colas, and Eliot Ford); review, reinforce success, and provide corrective feedback toward improvement. Teach the client problem-solving strategies involving specifically defining a problem, generating options for addressing it, evaluating the pros and cons of each option, selecting and implementing an optional action, and reevaluating and refining the action. Objective  Learn and implement calming skills to reduce overall anxiety and manage anxiety symptoms. Target Date: 2023-08-05 Frequency: monthly  Progress: 50 Modality: individual  Related Interventions  Assign the client to read about progressive muscle relaxation and other calming strategies in relevant books or treatment manuals (e.g., Progressive Relaxation Training by Leroy Kennedy; Mastery of Your Anxiety and Worry: Workbook by Beckie Busing). Assign the client homework each session in which he/she practices relaxation exercises daily, gradually applying them progressively from non-anxiety-provoking to anxiety-provoking situations; review and reinforce success while providing corrective feedback toward improvement. Teach the client calming/relaxation skills (e.g., applied relaxation, progressive muscle relaxation, cue controlled relaxation; mindful breathing; biofeedback) and how to discriminate better between relaxation and tension; teach the client how to apply these  skills to his/her daily life. 7. Recognize, accept, and cope with feelings of depression.  8. Reduce overall frequency, intensity, and duration of the anxiety so that daily functioning is not impaired.  9. Resolve the core conflict that is the source of anxiety.  10. Stabilize anxiety level while increasing ability to function on a daily basis.  Diagnosis    F43.23   Conditions For Discharge  Achievement of treatment goals and objectives   Clint Bolder, LCSW

## 2022-10-13 ENCOUNTER — Other Ambulatory Visit: Payer: Self-pay | Admitting: Family Medicine

## 2022-10-13 DIAGNOSIS — E1165 Type 2 diabetes mellitus with hyperglycemia: Secondary | ICD-10-CM

## 2022-10-24 LAB — HM DIABETES EYE EXAM

## 2022-10-28 ENCOUNTER — Encounter: Payer: Self-pay | Admitting: Family Medicine

## 2022-10-28 ENCOUNTER — Ambulatory Visit (INDEPENDENT_AMBULATORY_CARE_PROVIDER_SITE_OTHER): Payer: 59 | Admitting: Family Medicine

## 2022-10-28 VITALS — BP 126/80 | HR 80 | Temp 99.0°F | Resp 16 | Ht 65.0 in | Wt 209.1 lb

## 2022-10-28 DIAGNOSIS — E1169 Type 2 diabetes mellitus with other specified complication: Secondary | ICD-10-CM | POA: Diagnosis not present

## 2022-10-28 DIAGNOSIS — E1165 Type 2 diabetes mellitus with hyperglycemia: Secondary | ICD-10-CM

## 2022-10-28 DIAGNOSIS — J4520 Mild intermittent asthma, uncomplicated: Secondary | ICD-10-CM

## 2022-10-28 DIAGNOSIS — E785 Hyperlipidemia, unspecified: Secondary | ICD-10-CM | POA: Diagnosis not present

## 2022-10-28 DIAGNOSIS — I10 Essential (primary) hypertension: Secondary | ICD-10-CM

## 2022-10-28 LAB — COMPREHENSIVE METABOLIC PANEL
ALT: 19 U/L (ref 0–35)
AST: 10 U/L (ref 0–37)
Albumin: 4.2 g/dL (ref 3.5–5.2)
Alkaline Phosphatase: 73 U/L (ref 39–117)
BUN: 9 mg/dL (ref 6–23)
CO2: 28 mEq/L (ref 19–32)
Calcium: 9.1 mg/dL (ref 8.4–10.5)
Chloride: 101 mEq/L (ref 96–112)
Creatinine, Ser: 0.56 mg/dL (ref 0.40–1.20)
GFR: 107.23 mL/min (ref >60.00)
Glucose, Bld: 165 mg/dL — ABNORMAL HIGH (ref 70–99)
Potassium: 3.7 mEq/L (ref 3.5–5.1)
Sodium: 136 mEq/L (ref 135–145)
Total Bilirubin: 0.5 mg/dL (ref 0.2–1.2)
Total Protein: 6.6 g/dL (ref 6.0–8.3)

## 2022-10-28 LAB — CBC WITH DIFFERENTIAL/PLATELET
Basophils Absolute: 0 10*3/uL (ref 0.0–0.1)
Basophils Relative: 0.8 % (ref 0.0–3.0)
Eosinophils Absolute: 0.1 10*3/uL (ref 0.0–0.7)
Eosinophils Relative: 1.7 % (ref 0.0–5.0)
HCT: 39 % (ref 36.0–46.0)
Hemoglobin: 13 g/dL (ref 12.0–15.0)
Lymphocytes Relative: 49.5 % — ABNORMAL HIGH (ref 12.0–46.0)
Lymphs Abs: 2.2 10*3/uL (ref 0.7–4.0)
MCHC: 33.2 g/dL (ref 30.0–36.0)
MCV: 87.8 fl (ref 78.0–100.0)
Monocytes Absolute: 0.5 10*3/uL (ref 0.1–1.0)
Monocytes Relative: 10.7 % (ref 3.0–12.0)
Neutro Abs: 1.6 10*3/uL (ref 1.4–7.7)
Neutrophils Relative %: 37.3 % — ABNORMAL LOW (ref 43.0–77.0)
Platelets: 287 10*3/uL (ref 150.0–400.0)
RBC: 4.44 Mil/uL (ref 3.87–5.11)
RDW: 14.1 % (ref 11.5–15.5)
WBC: 4.4 10*3/uL (ref 4.0–10.5)

## 2022-10-28 LAB — LIPID PANEL
Cholesterol: 119 mg/dL (ref 0–200)
HDL: 40.4 mg/dL (ref >39.00)
LDL Cholesterol: 67 mg/dL (ref 0–99)
NonHDL: 78.88
Total CHOL/HDL Ratio: 3
Triglycerides: 57 mg/dL (ref 0.0–149.0)
VLDL: 11.4 mg/dL (ref 0.0–40.0)

## 2022-10-28 LAB — HEMOGLOBIN A1C: Hgb A1c MFr Bld: 8.6 % — ABNORMAL HIGH (ref 4.6–6.5)

## 2022-10-28 LAB — MICROALBUMIN / CREATININE URINE RATIO
Creatinine,U: 176.8 mg/dL
Microalb Creat Ratio: 1.1 mg/g (ref 0.0–30.0)
Microalb, Ur: 2 mg/dL — ABNORMAL HIGH (ref 0.0–1.9)

## 2022-10-28 LAB — TSH: TSH: 1.43 u[IU]/mL (ref 0.35–5.50)

## 2022-10-28 NOTE — Assessment & Plan Note (Signed)
Well controlled, no changes to meds. Encouraged heart healthy diet such as the DASH diet and exercise as tolerated.  °

## 2022-10-28 NOTE — Assessment & Plan Note (Signed)
hgba1c to be checked, minimize simple carbs. Increase exercise as tolerated. Continue current meds  

## 2022-10-28 NOTE — Assessment & Plan Note (Signed)
Encourage heart healthy diet such as MIND or DASH diet, increase exercise, avoid trans fats, simple carbohydrates and processed foods, consider a krill or fish or flaxseed oil cap daily.  °

## 2022-10-28 NOTE — Progress Notes (Signed)
Subjective:   By signing my name below, I, Shehryar Baig, attest that this documentation has been prepared under the direction and in the presence of Donato Schultz, DO. 10/28/2022   Patient ID: Jasmine Hinton, female    DOB: 1971-01-17, 52 y.o.   MRN: [id number]  Chief Complaint  Patient presents with   Follow-up    Fasting    HPI Patient is in today for a follow up visit.   She is requesting to update her medication contract.  She is UTD on medication refills.  She has no new issues to report. She cut out carbohydrates from her diet to help her weight loss and reports doing well since.  Wt Readings from Last 3 Encounters:  10/28/22 209 lb 2 oz (94.9 kg)  05/29/22 210 lb (95.3 kg)  05/14/22 210 lb (95.3 kg)    Past Medical History:  Diagnosis Date   Anxiety    Asthma    Depression    Diabetes mellitus    Hypertension     Past Surgical History:  Procedure Laterality Date   TONSILLECTOMY      Family History  Problem Relation Age of Onset   Hypertension Mother    Thyroid disease Mother    Diabetes Mother    Diabetes Father    COPD Father    Colon polyps Maternal Grandfather    Colon cancer Maternal Grandfather 29   Diabetes Other    Hypertension Other    Depression Other    Esophageal cancer Neg Hx    Rectal cancer Neg Hx    Stomach cancer Neg Hx     Social History   Socioeconomic History   Marital status: Married    Spouse name: william   Number of children: Not on file   Years of education: Not on file   Highest education level: Bachelor's degree (e.g., BA, AB, BS)  Occupational History   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    Comment: one partner currently  Other Topics  Concern   Not on file  Social History Narrative   Not on file   Social Determinants of Health   Financial Resource Strain: Not on file  Food Insecurity: No Food Insecurity (10/27/2022)   Hunger Vital Sign    Worried About Running Out of Food in the Last Year: Never true    Ran Out of Food in the Last Year: Never true  Transportation Needs: No Transportation Needs (10/27/2022)   PRAPARE - Administrator, Civil Service (Medical): No    Lack of Transportation (Non-Medical): No  Physical Activity: Insufficiently Active (10/27/2022)   Exercise Vital Sign    Days of Exercise per Week: 1 day    Minutes of Exercise per Session: 30 min  Stress: No Stress Concern Present (10/27/2022)   [employer] of Occupational Health - Occupational Stress Questionnaire    Feeling of Stress : Only a little  Social Connections: Unknown (10/27/2022)   Social Connection and Isolation Panel [NHANES]    Frequency of Communication with Friends and Family: More than three times a week    Frequency of Social Gatherings with Friends and Family: Three  times a week    Attends Religious Services: Patient declined    Active Member of Clubs or Organizations: Patient declined    Attends Banker Meetings: Not on file    Marital Status: Married  Catering manager Violence: Not on file    Outpatient Medications Prior to Visit  Medication Sig Dispense Refill   albuterol (PROAIR HFA) 108 (90 Base) MCG/ACT inhaler Inhale 2 puffs into the lungs every 6 (six) hours as needed for wheezing or shortness of breath. 1 each 5   atorvastatin (LIPITOR) 20 MG tablet Take 1 tablet by mouth once daily 90 tablet 1   citalopram (CELEXA) 20 MG tablet Take 1 tablet (20 mg total) by mouth daily. 90 tablet 3   glucose blood (ONETOUCH VERIO) test strip Use as instructed twice a day.  E11.9 100 each 1   lisinopril-hydrochlorothiazide (ZESTORETIC) 10-12.5 MG tablet Take 1 tablet by mouth daily. 90 tablet 3   metFORMIN  (GLUCOPHAGE) 1000 MG tablet TAKE 1 TABLET BY MOUTH TWICE DAILY WITH MEALS 180 tablet 0   montelukast (SINGULAIR) 10 MG tablet Take 1 tablet (10 mg total) by mouth at bedtime. 90 tablet 3   clonazePAM (KLONOPIN) 1 MG tablet 1 po tid prn (Patient not taking: Reported on 05/14/2022) 90 tablet 1   fluticasone (FLONASE) 50 MCG/ACT nasal spray Place 2 sprays into both nostrils daily. (Patient not taking: Reported on 05/14/2022) 16 g 5   fluticasone (FLONASE) 50 MCG/ACT nasal spray Place 2 sprays into both nostrils daily. (Patient not taking: Reported on 10/28/2022) 16 g 6   Insulin Pen Needle (BD PEN NEEDLE NANO 2ND GEN) 32G X 4 MM MISC Use as directed with Victoza once a day (Patient not taking: Reported on 10/28/2022) 100 each 3   ONETOUCH DELICA LANCETS FINE MISC Use as instructed twice a day.  E11.9 (Patient not taking: Reported on 05/14/2022) 100 each 1   Semaglutide,0.25 or 0.5MG /DOS, (OZEMPIC, 0.25 OR 0.5 MG/DOSE,) 2 MG/1.5ML SOPN 0.25 mg weekly x4 then increase to 0.5 mg (Patient not taking: Reported on 05/14/2022) 1.5 mL 3   No facility-administered medications prior to visit.    No Known Allergies  Review of Systems  Constitutional:  Negative for chills, fever and malaise/fatigue.  HENT:  Negative for congestion and hearing loss.   Eyes:  Negative for blurred vision and discharge.  Respiratory:  Negative for cough, sputum production and shortness of breath.   Cardiovascular:  Negative for chest pain, palpitations and leg swelling.  Gastrointestinal:  Negative for abdominal pain, blood in stool, constipation, diarrhea, heartburn, nausea and vomiting.  Genitourinary:  Negative for dysuria, frequency, hematuria and urgency.  Musculoskeletal:  Negative for back pain, falls and myalgias.  Skin:  Negative for rash.  Neurological:  Negative for dizziness, sensory change, loss of consciousness, weakness and headaches.  Endo/Heme/Allergies:  Negative for environmental allergies. Does not bruise/bleed  easily.  Psychiatric/Behavioral:  Negative for depression and suicidal ideas. The patient is not nervous/anxious and does not have insomnia.        Objective:    Physical Exam Vitals and nursing note reviewed.  Constitutional:      General: She is not in acute distress.    Appearance: Normal appearance. She is well-developed. She is not ill-appearing.  HENT:     Head: Normocephalic and atraumatic.     Right Ear: External ear normal.     Left Ear: External ear normal.     Nose: Nose normal.  Eyes:  Extraocular Movements: Extraocular movements intact.     Pupils: Pupils are equal, round, and reactive to light.  Cardiovascular:     Rate and Rhythm: Normal rate and regular rhythm.     Heart sounds: Normal heart sounds. No murmur heard.    No gallop.  Pulmonary:     Effort: Pulmonary effort is normal. No respiratory distress.     Breath sounds: Normal breath sounds. No wheezing or rales.  Chest:     Chest wall: No tenderness.  Musculoskeletal:        General: Normal range of motion.     Cervical back: Normal range of motion and neck supple.  Skin:    General: Skin is warm and dry.  Neurological:     General: No focal deficit present.     Mental Status: She is alert and oriented to person, place, and time.  Psychiatric:        Mood and Affect: Mood normal.        Behavior: Behavior normal.        Thought Content: Thought content normal.        Judgment: Judgment normal.     BP 126/80   Pulse 80   Temp 99 F (37.2 C) (Oral)   Resp 16   Ht  (1.651 m)   Wt 209 lb 2 oz (94.9 kg)   SpO2 99%   BMI 34.80 kg/m  Wt Readings from Last 3 Encounters:  10/28/22 209 lb 2 oz (94.9 kg)  05/29/22 210 lb (95.3 kg)  05/14/22 210 lb (95.3 kg)       Assessment & Plan:  Type 2 diabetes mellitus with hyperglycemia, without long-term current use of insulin Assessment & Plan: hgba1c to be checked ,  minimize simple carbs. Increase exercise as tolerated. Continue current meds    Orders: -     CBC with Differential/Platelet -     Comprehensive metabolic panel -     Lipid panel -     TSH -     Hemoglobin A1c -     Microalbumin / creatinine urine ratio  Hyperlipidemia associated with type 2 diabetes mellitus -     Comprehensive metabolic panel -     Lipid panel  Essential hypertension Assessment & Plan: Well controlled, no changes to meds. Encouraged heart healthy diet such as the DASH diet and exercise as tolerated.    Orders: -     CBC with Differential/Platelet -     Comprehensive metabolic panel -     Lipid panel -     TSH  Mild intermittent asthma without complication  Hyperlipidemia LDL goal <70 Assessment & Plan: Encourage heart healthy diet such as MIND or DASH diet, increase exercise, avoid trans fats, simple carbohydrates and processed foods, consider a krill or fish or flaxseed oil cap daily.       IDonato Schultz, DO, personally preformed the services described in this documentation.  All medical record entries made by the scribe were at my direction and in my presence.  I have reviewed the chart and discharge instructions (if applicable) and agree that the record reflects my personal performance and is accurate and complete. 10/28/2022   I,Shehryar Baig,acting as a scribe for Donato Schultz, DO.,have documented all relevant documentation on the behalf of Donato Schultz, DO,as directed by  Donato Schultz, DO while in the presence of Donato Schultz, DO.   Donato Schultz,  DO

## 2022-11-05 ENCOUNTER — Other Ambulatory Visit: Payer: Self-pay | Admitting: Family Medicine

## 2022-11-05 DIAGNOSIS — E1165 Type 2 diabetes mellitus with hyperglycemia: Secondary | ICD-10-CM

## 2022-11-05 DIAGNOSIS — E1169 Type 2 diabetes mellitus with other specified complication: Secondary | ICD-10-CM

## 2022-11-05 DIAGNOSIS — I10 Essential (primary) hypertension: Secondary | ICD-10-CM

## 2022-11-06 ENCOUNTER — Other Ambulatory Visit: Payer: Self-pay | Admitting: Family Medicine

## 2022-11-07 ENCOUNTER — Ambulatory Visit (INDEPENDENT_AMBULATORY_CARE_PROVIDER_SITE_OTHER): Payer: 59 | Admitting: Psychology

## 2022-11-07 ENCOUNTER — Other Ambulatory Visit: Payer: Self-pay

## 2022-11-07 DIAGNOSIS — E1165 Type 2 diabetes mellitus with hyperglycemia: Secondary | ICD-10-CM

## 2022-11-07 DIAGNOSIS — F4323 Adjustment disorder with mixed anxiety and depressed mood: Secondary | ICD-10-CM | POA: Diagnosis not present

## 2022-11-07 NOTE — Progress Notes (Signed)
[facility] Counselor/Therapist Progress Note  Patient ID: AVALEE CASTRELLON, MRN: [id number],    Date: 11/07/2022  Time Spent: 12:00pm-12:55pm    55 minutes   Treatment Type: Individual Therapy  Reported Symptoms: stress  Mental Status Exam: Appearance:  Casual     Behavior: Appropriate  Motor: Normal  Speech/Language:  Normal Rate  Affect: Appropriate  Mood: normal  Thought process: normal  Thought content:   WNL  Sensory/Perceptual disturbances:   WNL  Orientation: oriented to person, place, time/date, and situation  Attention: Good  Concentration: Good  Memory: WNL  Fund of knowledge:  Good  Insight:   Good  Judgment:  Good  Impulse Control: Good   Risk Assessment: Danger to Self:  No Self-injurious Behavior: No Danger to Others: No Duty to Warn:no Physical Aggression / Violence:No  Access to Firearms a concern: No  Gang Involvement:No   Subjective: Pt present for face-to-face individual therapy via video.  Pt consents to telehealth video session due to COVID 19 pandemic. Location of pt: home Location of therapist: home office.   Pt talked about work.  She has felt some frustrations recently.   The work culture can be challenging.  At times other employees drop the ball and pt ends up having to cover and pick things up.   Helped pt process her feelings and work dynamics.  Addressed how pt can decompress from work so she is not thinking about it at home.  Pt talked about her family.  Her relationship with her mother can be challenging.  Pt has a lot of unresolved feelings from the past and wants to talk to her mother about them.  Helped pt process her feelings and how pt can communicate her feelings and needs to her mother.  Worked on increasing self care.   Provided supportive therapy.   Interventions: Cognitive Behavioral Therapy and Insight-Oriented  Diagnosis: F43.23  Plan: Plan of Care: Recommend ongoing therapy.   Pt participated in setting  treatment goals.   Plan to meet monthly.   Treatment Plan    Client Abilities/Strengths   Pt is bright, engaging, and motivated for therapy.  Client Treatment Preferences   Individual therapy.  Client Statement of Needs   Improve copings skills and have a safe place to talk about issues.   Symptoms   Depressed or irritable mood. Excessive and/or unrealistic worry that is difficult to control occurring more days than not for at least 6 months about a number of events or activities.  Problems Addressed   Unipolar Depression, Anxiety Goals  1. Alleviate depressive symptoms and return to previous level of effective functioning.  2. Appropriately grieve the loss in order to normalize mood and to return to previously adaptive level of functioning.  Objective  Learn and implement behavioral strategies to overcome depression. Target Date: 2023-08-05 Frequency: monthly  Progress: 50 Modality: individual  Related Interventions  Assist the client in developing skills that increase the likelihood of deriving pleasure from behavioral activation (e.g., assertiveness skills, developing an exercise plan, less internal/more external focus, increased social involvement); reinforce success. Engage the client in "behavioral activation," increasing his/her activity level and contact with sources of reward, while identifying processes that inhibit activation. use behavioral techniques such as instruction, rehearsal, role-playing, role reversal, as needed, to facilitate activity in the client's daily life; reinforce success. 3. Develop healthy interpersonal relationships that lead to the alleviation and help prevent the relapse of depression.  4. Develop healthy thinking patterns and beliefs about self, others,  and the world that lead to the alleviation and help prevent the relapse of depression.  5. Enhance ability to effectively cope with the full variety of life's worries and anxieties.  6. Learn  and implement coping skills that result in a reduction of anxiety and worry, and improved daily functioning.  Objective  Learn and implement problem-solving strategies for realistically addressing worries. Target Date: 2023-08-05 Frequency: monthly  Progress: 50 Modality: individual  Related Interventions  Assign the client a homework exercise in which he/she problem-solves a current problem (see Mastery of Your Anxiety and Worry: Workbook by Elenora Fender and Filbert Schilder or Generalized Anxiety Disorder by Elesa Hacker, and Filbert Schilder); review, reinforce success, and provide corrective feedback toward improvement. Teach the client problem-solving strategies involving specifically defining a problem, generating options for addressing it, evaluating the pros and cons of each option, selecting and implementing an optional action, and reevaluating and refining the action. Objective  Learn and implement calming skills to reduce overall anxiety and manage anxiety symptoms. Target Date: 2023-08-05 Frequency: monthly  Progress: 50 Modality: individual  Related Interventions  Assign the client to read about progressive muscle relaxation and other calming strategies in relevant books or treatment manuals (e.g., Progressive Relaxation Training by Twana First; Mastery of Your Anxiety and Worry: Workbook by Earlie Counts). Assign the client homework each session in which he/she practices relaxation exercises daily, gradually applying them progressively from non-anxiety-provoking to anxiety-provoking situations; review and reinforce success while providing corrective feedback toward improvement. Teach the client calming/relaxation skills (e.g., applied relaxation, progressive muscle relaxation, cue controlled relaxation; mindful breathing; biofeedback) and how to discriminate better between relaxation and tension; teach the client how to apply these skills to his/her daily life. 7. Recognize, accept, and cope  with feelings of depression.  8. Reduce overall frequency, intensity, and duration of the anxiety so that daily functioning is not impaired.  9. Resolve the core conflict that is the source of anxiety.  10. Stabilize anxiety level while increasing ability to function on a daily basis.  Diagnosis    F43.23   Conditions For Discharge  Achievement of treatment goals and objectives   Salomon Fick, LCSW

## 2022-11-14 ENCOUNTER — Ambulatory Visit: Payer: 59 | Admitting: "Endocrinology

## 2022-11-25 ENCOUNTER — Encounter: Payer: Self-pay | Admitting: "Endocrinology

## 2022-11-25 ENCOUNTER — Ambulatory Visit (INDEPENDENT_AMBULATORY_CARE_PROVIDER_SITE_OTHER): Payer: 59 | Admitting: "Endocrinology

## 2022-11-25 VITALS — BP 120/80 | HR 87 | Ht 65.0 in | Wt 207.4 lb

## 2022-11-25 DIAGNOSIS — E7800 Pure hypercholesterolemia, unspecified: Secondary | ICD-10-CM

## 2022-11-25 DIAGNOSIS — Z7984 Long term (current) use of oral hypoglycemic drugs: Secondary | ICD-10-CM

## 2022-11-25 DIAGNOSIS — E1165 Type 2 diabetes mellitus with hyperglycemia: Secondary | ICD-10-CM

## 2022-11-25 MED ORDER — SEMAGLUTIDE(0.25 OR 0.5MG/DOS) 2 MG/3ML ~~LOC~~ SOPN: 0.2500 mg | PEN_INJECTOR | SUBCUTANEOUS | 0 refills | Status: DC

## 2022-11-25 MED ORDER — EMPAGLIFLOZIN 10 MG PO TABS: 10.0000 mg | ORAL_TABLET | Freq: Every day | ORAL | 2 refills | Status: DC

## 2022-11-25 MED ORDER — DEXCOM G7 SENSOR MISC: 1.0000 | 0 refills | Status: AC

## 2022-11-25 NOTE — Progress Notes (Signed)
Outpatient Endocrinology Note Jasmine Stratford, MD  11/25/22   Jasmine Hinton 01-04-1971 [id number]  Referring Provider: Zola Button, Grayling Congress, * Primary Care Provider: Zola Button, Grayling Congress, DO Reason for consultation: Subjective   Assessment & Plan  Diagnoses and all orders for this visit:  Uncontrolled type 2 diabetes mellitus with hyperglycemia (HCC)  Long term (current) use of oral hypoglycemic drugs  Pure hypercholesterolemia  Other orders -     Semaglutide,0.25 or 0.5MG /DOS, 2 MG/3ML SOPN; Inject 0.25 mg into the skin once a week. -     empagliflozin (JARDIANCE) 10 MG TABS tablet; Take 1 tablet (10 mg total) by mouth daily before breakfast. -     Continuous Glucose Sensor (DEXCOM G7 SENSOR) MISC; 1 Device by Does not apply route continuous.    Diabetes complicated by hyperglycemia  Hba1c goal less than 7.0, current Hba1c is 8.6. Will recommend for the following change of medications to: Metformin 1000 mg bid  Start ozempic 0.25 mg weekly (in case not covered, pt will start jardiance 10 mg qd)  No known contraindications to any of above medications No history of MEN syndrome/medullary thyroid cancer/pancreatitis or pancreatic cancer in self or family No UTIs   Educated on risks and side effects of ozempic including but not limited to pancreatitis, pancreatic cancer and medullary thyroid cancer in mice. Patient verbalized adequate understanding.    Educated on risks and side effects of SGLT-2 inhibitors including but not limited to yeast infections, urinary tract infections, dehydration, hyperkalemia, euglycemic DKA etc. Patient to notify us in case of any side effects   Hyperlipidemia -Last LDL at goal: 67 -on atorvastatin 20 mg QD -Follow low fat diet and exercise   -Blood pressure goal <140/90 - Microalbumin/creatinine at goal < 30 -On lisinopril 10 mg qd -diet changes including salt restriction -limit eating outside -counseled BP targets per  standards of diabetes care -Uncontrolled blood pressure can lead to retinopathy, nephropathy and cardiovascular and atherosclerotic heart disease  Reviewed and counseled on: -A1C target -Blood sugar targets -Complications of uncontrolled diabetes  -Checking blood sugar before meals and bedtime and bring log next visit -All medications with mechanism of action and side effects -Hypoglycemia management: rule of 15's, Glucagon Emergency Kit and medical alert ID -low-carb low-fat plate-method diet -At least 20 minutes of physical activity per day -Annual dilated retinal eye exam and foot exam -compliance and follow up needs -follow up as scheduled or earlier if problem gets worse  Call if blood sugar is less than 70 or consistently above 250    Take a 15 gm snack of carbohydrate at bedtime before you go to sleep if your blood sugar is less than 100.    If you are going to fast after midnight for a test or procedure, ask your physician for instructions on how to reduce/decrease your insulin dose.    Call if blood sugar is less than 70 or consistently above 250  -Treating a low sugar by rule of 15  (15 gms of sugar every 15 min until sugar is more than 70) If you feel your sugar is low, test your sugar to be sure If your sugar is low (less than 70), then take 15 grams of a fast acting Carbohydrate (3-4 glucose tablets or glucose gel or 4 ounces of juice or regular soda) Recheck your sugar 15 min after treating low to make sure it is more than 70 If sugar is still less than 70, treat again with 15  grams of carbohydrate          Don't drive the hour of hypoglycemia  If unconscious/unable to eat or drink by mouth, use glucagon injection or nasal spray baqsimi and call 911. Can repeat again in 15 min if still unconscious.  Return in about 5 weeks (around 12/30/2022) for visit.   I have reviewed current medications, nurse's notes, allergies, vital signs, past medical and surgical history,  family medical history, and social history for this encounter. Counseled patient on symptoms, examination findings, lab findings, imaging results, treatment decisions and monitoring and prognosis. The patient understood the recommendations and agrees with the treatment plan. All questions regarding treatment plan were fully answered.  Jasmine [place], MD  11/25/22    History of Present Illness Jasmine Hinton is a 52 y.o. year old female who presents for evaluation of Type 2 diabetes mellitus.  Jasmine Hinton was first diagnosed in 2008.   Diabetes education +  Home diabetes regimen: Metformin 1000 mg bid   COMPLICATIONS -  MI/Stroke -  retinopathy, last eye exam 2024 -  neuropathy -  nephropathy  BLOOD SUGAR DATA Doesn't check BG No meter  Physical Exam  BP 120/80 (BP Location: Left Arm, Patient Position: Sitting, Cuff Size: Normal)   Pulse 87   Ht 5\' 5"  (1.651 m)   Wt 207 lb 6.4 oz (94.1 kg)   BMI 34.51 kg/m    Constitutional: well developed, well nourished Head: normocephalic, atraumatic Eyes: sclera anicteric, no redness Neck: supple Lungs: normal respiratory effort Neurology: alert and oriented Skin: dry, no appreciable rashes Musculoskeletal: no appreciable defects Psychiatric: normal mood and affect Diabetic Foot Exam - Simple   No data filed      Current Medications Patient's Medications  New Prescriptions   CONTINUOUS GLUCOSE SENSOR (DEXCOM G7 SENSOR) MISC    1 Device by Does not apply route continuous.   EMPAGLIFLOZIN (JARDIANCE) 10 MG TABS TABLET    Take 1 tablet (10 mg total) by mouth daily before breakfast.   SEMAGLUTIDE,0.25 OR 0.5MG /DOS, 2 MG/3ML SOPN    Inject 0.25 mg into the skin once a week.  Previous Medications   ALBUTEROL (PROAIR HFA) 108 (90 BASE) MCG/ACT INHALER    Inhale 2 puffs into the lungs every 6 (six) hours as needed for wheezing or shortness of breath.   ATORVASTATIN (LIPITOR) 20 MG TABLET    Take 1 tablet by mouth once daily    CITALOPRAM (CELEXA) 20 MG TABLET    Take 1 tablet (20 mg total) by mouth daily.   GLUCOSE BLOOD (ONETOUCH VERIO) TEST STRIP    Use as instructed twice a day.  E11.9   LISINOPRIL-HYDROCHLOROTHIAZIDE (ZESTORETIC) 10-12.5 MG TABLET    Take 1 tablet by mouth daily.   METFORMIN (GLUCOPHAGE) 1000 MG TABLET    TAKE 1 TABLET BY MOUTH TWICE DAILY WITH MEALS   MONTELUKAST (SINGULAIR) 10 MG TABLET    Take 1 tablet (10 mg total) by mouth at bedtime.  Modified Medications   No medications on file  Discontinued Medications   No medications on file    Allergies No Known Allergies  Past Medical History Past Medical History:  Diagnosis Date   Anxiety    Asthma    Depression    Diabetes mellitus    Hypertension     Past Surgical History Past Surgical History:  Procedure Laterality Date   TONSILLECTOMY      Family History family history includes COPD in her father; Colon cancer (age of  onset: 51) in her maternal grandfather; Colon polyps in her maternal grandfather; Depression in an other family member; Diabetes in her father, mother, and another family member; Hypertension in her mother and another family member; Thyroid disease in her mother.  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    Comment: one partner currently  Other Topics Concern   Not on file  Social History Narrative   Not on file   Social Determinants of Health   Financial Resource Strain: Not on  file  Food Insecurity: No Food Insecurity (10/27/2022)   Hunger Vital Sign    Worried About Running Out of Food in the Last Year: Never true    Ran Out of Food in the Last Year: Never true  Transportation Needs: No Transportation Needs (10/27/2022)   PRAPARE - Administrator, Civil Service (Medical): No    Lack of Transportation (Non-Medical): No  Physical Activity: Insufficiently Active (10/27/2022)   Exercise Vital Sign    Days of Exercise per Week: 1 day    Minutes of Exercise per Session: 30 min  Stress: No Stress Concern Present (10/27/2022)   [employer] of Occupational Health - Occupational Stress Questionnaire    Feeling of Stress : Only a little  Social Connections: Unknown (10/27/2022)   Social Connection and Isolation Panel [NHANES]    Frequency of Communication with Friends and Family: More than three times a week    Frequency of Social Gatherings with Friends and Family: Three times a week    Attends Religious Services: Patient declined    Active Member of Clubs or Organizations: Patient declined    Attends Banker Meetings: Not on file    Marital Status: Married  Intimate Partner Violence: Not on file    Lab Results  Component Value Date   HGBA1C 8.6 (H) 10/28/2022   Lab Results  Component Value Date   CHOL 119 10/28/2022   Lab Results  Component Value Date   HDL 40.40 10/28/2022   Lab Results  Component Value Date   LDLCALC 67 10/28/2022   Lab Results  Component Value Date   TRIG 57.0 10/28/2022   Lab Results  Component Value Date   CHOLHDL 3 10/28/2022   Lab Results  Component Value Date   CREATININE 0.56 10/28/2022   Lab Results  Component Value Date   GFR 107.23 10/28/2022   Lab Results  Component Value Date   MICROALBUR 2.0 (H) 10/28/2022      Component Value Date/Time   NA 136 10/28/2022 1037   K 3.7 10/28/2022 1037   CL 101 10/28/2022 1037   CO2 28 10/28/2022 1037   GLUCOSE 165 (H) 10/28/2022 1037    BUN 9 10/28/2022 1037   CREATININE 0.56 10/28/2022 1037   CALCIUM 9.1 10/28/2022 1037   PROT 6.6 10/28/2022 1037   ALBUMIN 4.2 10/28/2022 1037   AST 10  10/28/2022 1037   ALT 19 10/28/2022 1037   ALKPHOS 73 10/28/2022 1037   BILITOT 0.5 10/28/2022 1037   GFRNONAA 145.22 10/16/2009 0859   GFRAA 122 09/18/2008 1500      Latest Ref Rng & Units 10/28/2022   10:37 AM 04/28/2022   10:08 AM 05/29/2020    2:50 PM  BMP  Glucose 70 - 99 mg/dL 161  096  045   BUN 6 - 23 mg/dL 9  8  11    Creatinine 0.40 - 1.20 mg/dL 4.09  8.11  9.14   Sodium 135 - 145 mEq/L 136  136  136   Potassium 3.5 - 5.1 mEq/L 3.7  3.8  3.9   Chloride 96 - 112 mEq/L 101  100  99   CO2 19 - 32 mEq/L 28  27  29    Calcium 8.4 - 10.5 mg/dL 9.1  9.0  9.5        Component Value Date/Time   WBC 4.4 10/28/2022 1037   RBC 4.44 10/28/2022 1037   HGB 13.0 10/28/2022 1037   HCT 39.0 10/28/2022 1037   PLT 287.0 10/28/2022 1037   MCV 87.8 10/28/2022 1037   MCHC 33.2 10/28/2022 1037   RDW 14.1 10/28/2022 1037   LYMPHSABS 2.2 10/28/2022 1037   MONOABS 0.5 10/28/2022 1037   EOSABS 0.1 10/28/2022 1037   BASOSABS 0.0 10/28/2022 1037     Parts of this note may have been dictated using voice recognition software. There may be variances in spelling and vocabulary which are unintentional. Not all errors are proofread. Please notify the Thereasa Parkin if any discrepancies are noted or if the meaning of any statement is not clear.

## 2022-11-25 NOTE — Patient Instructions (Addendum)
  Educated on risks and side effects of ozmepic including but not limited to pancreatitis, pancreatic cancer and medullary thyroid cancer in mice. Patient verbalized adequate understanding.     Educated on risks and side effects of jardiance including but not limited to yeast infections, urinary tract infections, dehydration, hyperkalemia, euglycemic DKA etc. Patient to notify us in case of any side effects   Jardiance start instruction for the pt  We will start you on a medication called jardiance. This medicine is once daily and can have side effects of increased urination, dizziness on standing and urinary tract infections. Please be very careful with hygiene after you urinate to help prevent this.  If you have any worsening abdominal pain or fatigue or urinary infection while on this medication, stop it and let us know.   Goals of DM therapy:  Morning Fasting blood sugar: 80-140  Blood sugar before meals: 80-140 Bed time blood sugar: 100-150  A1C <7%, limited only by hypoglycemia  1.Diabetes medications and their side effects discussed, including hypoglycemia    2. Check blood glucose:  a) Always check blood sugars before driving. Please see below (under hypoglycemia) on how to manage b) Check a minimum of 3 times/day or more as needed when having symptoms of hypoglycemia.   c) Try to check blood glucose before sleeping/in the middle of the night to ensure that it is remaining stable and not dropping less than 100 d) Check blood glucose more often if sick  3. Diet: a) 3 meals per day schedule b: Restrict carbs to 60-70 grams (4 servings) per meal c) Colorful vegetables - 3 servings a day, and low sugar fruit 2 servings/day Plate control method: 1/4 plate protein, 1/4 starch, 1/2 green, yellow, or red vegetables d) Avoid carbohydrate snacks unless hypoglycemic episode, or increased physical activity  4. Regular exercise as tolerated, preferably 3 or more hours a week  5.  Hypoglycemia: a)  Do not drive or operate machinery without first testing blood glucose to assure it is over 90 mg%, or if dizzy, lightheaded, not feeling normal, etc, or  if foot or leg is numb or weak. b)  If blood glucose less than 70, take four 5gm Glucose tabs or 15-30 gm Glucose gel.  Repeat every 15 min as needed until blood sugar is >100 mg/dl. If hypoglycemia persists then call 911.   6. Sick day management: a) Check blood glucose more often b) Continue usual therapy if blood sugars are elevated.   7. Contact the doctor immediately if blood glucose is frequently <60 mg/dl, or an episode of severe hypoglycemia occurs (where someone had to give you glucose/  glucagon or if you passed out from a low blood glucose), or if blood glucose is persistently >350 mg/dl, for further management  8. A change in level of physical activity or exercise and a change in diet may also affect your blood sugar. Check blood sugars more often and call if needed.  Instructions: 1. Bring glucose meter, blood glucose records on every visit for review 2. Continue to follow up with primary care physician and other providers for medical care 3. Yearly eye  and foot exam 4. Please get blood work done prior to the next appointment

## 2022-12-05 ENCOUNTER — Ambulatory Visit (INDEPENDENT_AMBULATORY_CARE_PROVIDER_SITE_OTHER): Payer: 59 | Admitting: Psychology

## 2022-12-05 DIAGNOSIS — F4323 Adjustment disorder with mixed anxiety and depressed mood: Secondary | ICD-10-CM | POA: Diagnosis not present

## 2022-12-05 NOTE — Progress Notes (Signed)
[facility] Counselor/Therapist Progress Note  Patient ID: Jasmine Hinton, MRN: [id number],    Date: 12/05/2022  Time Spent: 12:00pm-12:55pm    55 minutes   Treatment Type: Individual Therapy  Reported Symptoms: stress  Mental Status Exam: Appearance:  Casual     Behavior: Appropriate  Motor: Normal  Speech/Language:  Normal Rate  Affect: Appropriate  Mood: normal  Thought process: normal  Thought content:   WNL  Sensory/Perceptual disturbances:   WNL  Orientation: oriented to person, place, time/date, and situation  Attention: Good  Concentration: Good  Memory: WNL  Fund of knowledge:  Good  Insight:   Good  Judgment:  Good  Impulse Control: Good   Risk Assessment: Danger to Self:  No Self-injurious Behavior: No Danger to Others: No Duty to Warn:no Physical Aggression / Violence:No  Access to Firearms a concern: No  Gang Involvement:No   Subjective: Pt present for face-to-face individual therapy via video.  Pt consents to telehealth video session due to COVID 19 pandemic. Location of pt: home Location of therapist: home office.  Pt states she has been trying to focus on the things she can control and release the things that are out of her control.   Pt plans to stay with her grandparents this weekend to give her mother a break.   Pt and husband are working on buying a house.  She is frustrated that the housing market is Jasmine expensive.  Pt is also stressed about having to pay her husband's tax bill before they can buy a house.  Pt feels disappointed that her husband is not taking care of the tax bill the way she would like him to.  Helped pt process her feelings and relationship dynamics.   Pt talked about her family.  Pt's mother spends a lot of money.  Pt's sister has a lot of money and pt feels like she has to compete with her at times.   Pt feels resentment about her parents regarding not paying for pt's college.  Pt states she has trouble dealing with  anger and tends to suppress it and then it builds up.  Worked with pt on emotional regulation skills.   Pt talked about work.  She has felt some frustrations recently.   The work culture can be challenging.  At times other employees drop the ball and pt ends up having to cover and pick things up.   Helped pt process her feelings and work dynamics.  Addressed how pt can decompress from work Jasmine she is not thinking about it at home.   Worked on increasing self care.   Provided supportive therapy.   Interventions: Cognitive Behavioral Therapy and Insight-Oriented  Diagnosis: F43.23  Plan: Plan of Care: Recommend ongoing therapy.   Pt participated in setting treatment goals.   Plan to meet monthly.   Treatment Plan    Client Abilities/Strengths   Pt is bright, engaging, and motivated for therapy.  Client Treatment Preferences   Individual therapy.  Client Statement of Needs   Improve copings skills and have a safe place to talk about issues.   Symptoms   Depressed or irritable mood. Excessive and/or unrealistic worry that is difficult to control occurring more days than not for at least 6 months about a number of events or activities.  Problems Addressed   Unipolar Depression, Anxiety Goals  1. Alleviate depressive symptoms and return to previous level of effective functioning.  2. Appropriately grieve the loss in order to normalize mood  and to return to previously adaptive level of functioning.  Objective  Learn and implement behavioral strategies to overcome depression. Target Date: 2023-08-05 Frequency: monthly  Progress: 50 Modality: individual  Related Interventions  Assist the client in developing skills that increase the likelihood of deriving pleasure from behavioral activation (e.g., assertiveness skills, developing an exercise plan, less internal/more external focus, increased social involvement); reinforce success. Engage the client in "behavioral activation," increasing  his/her activity level and contact with sources of reward, while identifying processes that inhibit activation. use behavioral techniques such as instruction, rehearsal, role-playing, role reversal, as needed, to facilitate activity in the client's daily life; reinforce success. 3. Develop healthy interpersonal relationships that lead to the alleviation and help prevent the relapse of depression.  4. Develop healthy thinking patterns and beliefs about self, others, and the world that lead to the alleviation and help prevent the relapse of depression.  5. Enhance ability to effectively cope with the full variety of life's worries and anxieties.  6. Learn and implement coping skills that result in a reduction of anxiety and worry, and improved daily functioning.  Objective  Learn and implement problem-solving strategies for realistically addressing worries. Target Date: 2023-08-05 Frequency: monthly  Progress: 50 Modality: individual  Related Interventions  Assign the client a homework exercise in which he/she problem-solves a current problem (see Mastery of Your Anxiety and Worry: Workbook by Elenora Fender and Filbert Schilder or Generalized Anxiety Disorder by Elesa Hacker, and Filbert Schilder); review, reinforce success, and provide corrective feedback toward improvement. Teach the client problem-solving strategies involving specifically defining a problem, generating options for addressing it, evaluating the pros and cons of each option, selecting and implementing an optional action, and reevaluating and refining the action. Objective  Learn and implement calming skills to reduce overall anxiety and manage anxiety symptoms. Target Date: 2023-08-05 Frequency: monthly  Progress: 50 Modality: individual  Related Interventions  Assign the client to read about progressive muscle relaxation and other calming strategies in relevant books or treatment manuals (e.g., Progressive Relaxation Training by Twana First; Mastery of Your Anxiety and Worry: Workbook by Earlie Counts). Assign the client homework each session in which he/she practices relaxation exercises daily, gradually applying them progressively from non-anxiety-provoking to anxiety-provoking situations; review and reinforce success while providing corrective feedback toward improvement. Teach the client calming/relaxation skills (e.g., applied relaxation, progressive muscle relaxation, cue controlled relaxation; mindful breathing; biofeedback) and how to discriminate better between relaxation and tension; teach the client how to apply these skills to his/her daily life. 7. Recognize, accept, and cope with feelings of depression.  8. Reduce overall frequency, intensity, and duration of the anxiety Jasmine that daily functioning is not impaired.  9. Resolve the core conflict that is the source of anxiety.  10. Stabilize anxiety level while increasing ability to function on a daily basis.  Diagnosis    F43.23   Conditions For Discharge  Achievement of treatment goals and objectives   Salomon Fick, LCSW

## 2022-12-11 ENCOUNTER — Telehealth: Payer: Self-pay

## 2022-12-11 NOTE — Telephone Encounter (Signed)
*  ENDO   PA request received via CMM for Dexcom G7 Sensor  PA submitted to [employer] via CMM and is pending determination  Patient not on an intensive insulin protocol, PA may be denied.  Key: [id number]

## 2022-12-11 NOTE — Telephone Encounter (Signed)
PA has been DENIED, denial letter has been attached in patients media. 

## 2022-12-11 NOTE — Telephone Encounter (Signed)
Please advise on how you would like to proceed?

## 2022-12-12 NOTE — Telephone Encounter (Signed)
Left a detailed message,   J.Aaryav Hopfensperger,RMA  

## 2022-12-30 ENCOUNTER — Other Ambulatory Visit: Payer: Self-pay | Admitting: Family Medicine

## 2022-12-30 DIAGNOSIS — E1169 Type 2 diabetes mellitus with other specified complication: Secondary | ICD-10-CM

## 2023-01-01 ENCOUNTER — Ambulatory Visit: Payer: 59 | Admitting: "Endocrinology

## 2023-01-06 ENCOUNTER — Ambulatory Visit (INDEPENDENT_AMBULATORY_CARE_PROVIDER_SITE_OTHER): Payer: 59 | Admitting: Psychology

## 2023-01-06 DIAGNOSIS — F4323 Adjustment disorder with mixed anxiety and depressed mood: Secondary | ICD-10-CM | POA: Diagnosis not present

## 2023-01-06 NOTE — Progress Notes (Signed)
[facility] Counselor/Therapist Progress Note  Patient ID: Jasmine Hinton, MRN: [id number],    Date: 01/06/2023  Time Spent: 12:00pm-12:55pm    55 minutes   Treatment Type: Individual Therapy  Reported Symptoms: stress  Mental Status Exam: Appearance:  Casual     Behavior: Appropriate  Motor: Normal  Speech/Language:  Normal Rate  Affect: Appropriate  Mood: normal  Thought process: normal  Thought content:   WNL  Sensory/Perceptual disturbances:   WNL  Orientation: oriented to person, place, time/date, and situation  Attention: Good  Concentration: Good  Memory: WNL  Fund of knowledge:  Good  Insight:   Good  Judgment:  Good  Impulse Control: Good   Risk Assessment: Danger to Self:  No Self-injurious Behavior: No Danger to Others: No Duty to Warn:no Physical Aggression / Violence:No  Access to Firearms a concern: No  Gang Involvement:No   Subjective: Pt present for face-to-face individual therapy via video.  Pt consents to telehealth video session and is aware of limitations of virtual sessions. Location of pt: home Location of therapist: home office.  Pt talked about being with family to celebrate her grandmother's 94th birthday.   There were some challenging family dynamics with pt's mother.   Addressed the interactions and helped pt process the issues and family dynamics.   Pt talked about having a panic attack recently bc she woke up with a floater in her vision which worried her.  Addressed pt's concerns and that she is thinking about worst case scenarios which is what contributes to her anxiety.   Worked on calming strategies and thought reframing.   Worked on increasing self care.   Provided supportive therapy.   Interventions: Cognitive Behavioral Therapy and Insight-Oriented  Diagnosis: F43.23  Plan: Plan of Care: Recommend ongoing therapy.   Pt participated in setting treatment goals.   Plan to meet monthly.   Treatment Plan    Client  Abilities/Strengths   Pt is bright, engaging, and motivated for therapy.  Client Treatment Preferences   Individual therapy.  Client Statement of Needs   Improve copings skills and have a safe place to talk about issues.   Symptoms   Depressed or irritable mood. Excessive and/or unrealistic worry that is difficult to control occurring more days than not for at least 6 months about a number of events or activities.  Problems Addressed   Unipolar Depression, Anxiety Goals  1. Alleviate depressive symptoms and return to previous level of effective functioning.  2. Appropriately grieve the loss in order to normalize mood and to return to previously adaptive level of functioning.  Objective  Learn and implement behavioral strategies to overcome depression. Target Date: 2023-08-05 Frequency: monthly  Progress: 50 Modality: individual  Related Interventions  Assist the client in developing skills that increase the likelihood of deriving pleasure from behavioral activation (e.g., assertiveness skills, developing an exercise plan, less internal/more external focus, increased social involvement); reinforce success. Engage the client in "behavioral activation," increasing his/her activity level and contact with sources of reward, while identifying processes that inhibit activation. use behavioral techniques such as instruction, rehearsal, role-playing, role reversal, as needed, to facilitate activity in the client's daily life; reinforce success. 3. Develop healthy interpersonal relationships that lead to the alleviation and help prevent the relapse of depression.  4. Develop healthy thinking patterns and beliefs about self, others, and the world that lead to the alleviation and help prevent the relapse of depression.  5. Enhance ability to effectively cope with the full variety  of life's worries and anxieties.  6. Learn and implement coping skills that result in a reduction of anxiety and  worry, and improved daily functioning.  Objective  Learn and implement problem-solving strategies for realistically addressing worries. Target Date: 2023-08-05 Frequency: monthly  Progress: 50 Modality: individual  Related Interventions  Assign the client a homework exercise in which he/she problem-solves a current problem (see Mastery of Your Anxiety and Worry: Workbook by Elenora Fender and Filbert Schilder or Generalized Anxiety Disorder by Elesa Hacker, and Filbert Schilder); review, reinforce success, and provide corrective feedback toward improvement. Teach the client problem-solving strategies involving specifically defining a problem, generating options for addressing it, evaluating the pros and cons of each option, selecting and implementing an optional action, and reevaluating and refining the action. Objective  Learn and implement calming skills to reduce overall anxiety and manage anxiety symptoms. Target Date: 2023-08-05 Frequency: monthly  Progress: 50 Modality: individual  Related Interventions  Assign the client to read about progressive muscle relaxation and other calming strategies in relevant books or treatment manuals (e.g., Progressive Relaxation Training by Twana First; Mastery of Your Anxiety and Worry: Workbook by Earlie Counts). Assign the client homework each session in which he/she practices relaxation exercises daily, gradually applying them progressively from non-anxiety-provoking to anxiety-provoking situations; review and reinforce success while providing corrective feedback toward improvement. Teach the client calming/relaxation skills (e.g., applied relaxation, progressive muscle relaxation, cue controlled relaxation; mindful breathing; biofeedback) and how to discriminate better between relaxation and tension; teach the client how to apply these skills to his/her daily life. 7. Recognize, accept, and cope with feelings of depression.  8. Reduce overall frequency,  intensity, and duration of the anxiety so that daily functioning is not impaired.  9. Resolve the core conflict that is the source of anxiety.  10. Stabilize anxiety level while increasing ability to function on a daily basis.  Diagnosis    F43.23   Conditions For Discharge  Achievement of treatment goals and objectives   Salomon Fick, LCSW

## 2023-01-14 ENCOUNTER — Other Ambulatory Visit: Payer: Self-pay | Admitting: Family Medicine

## 2023-01-14 DIAGNOSIS — E1165 Type 2 diabetes mellitus with hyperglycemia: Secondary | ICD-10-CM

## 2023-02-02 ENCOUNTER — Ambulatory Visit (INDEPENDENT_AMBULATORY_CARE_PROVIDER_SITE_OTHER): Payer: 59 | Admitting: Psychology

## 2023-02-02 DIAGNOSIS — F4323 Adjustment disorder with mixed anxiety and depressed mood: Secondary | ICD-10-CM

## 2023-02-02 NOTE — Progress Notes (Signed)
[facility] Counselor/Therapist Progress Note  Patient ID: Jasmine Hinton, MRN: [id number],    Date: 02/02/2023  Time Spent: 12:00pm-12:55pm    55 minutes   Treatment Type: Individual Therapy  Reported Symptoms: stress  Mental Status Exam: Appearance:  Casual     Behavior: Appropriate  Motor: Normal  Speech/Language:  Normal Rate  Affect: Appropriate  Mood: normal  Thought process: normal  Thought content:   WNL  Sensory/Perceptual disturbances:   WNL  Orientation: oriented to person, place, time/date, and situation  Attention: Good  Concentration: Good  Memory: WNL  Fund of knowledge:  Good  Insight:   Good  Judgment:  Good  Impulse Control: Good   Risk Assessment: Danger to Self:  No Self-injurious Behavior: No Danger to Others: No Duty to Warn:no Physical Aggression / Violence:No  Access to Firearms a concern: No  Gang Involvement:No   Subjective: Pt present for face-to-face individual therapy via video.  Pt consents to telehealth video session and is aware of limitations of virtual sessions. Location of pt: home Location of therapist: home office.  Pt talked about getting ready for her grandfather's 63 year birthday celebration.   Pt's family is planning a celebration together and it was less stressful than anticipated.   Pt talked about her relationship with her husband.   She states she is the one who tends to have to initiate sex and would like for her husband to initiate more.   Helped pt process her feelings and relationship dynamics.  Addressed how pt can effectively communicate her needs to her husband.  Pt talked about politics and how differing politics between friends has affected her relationships.   This has created some anxiety for pt.   Worked on increasing self care.   Provided supportive therapy.   Interventions: Cognitive Behavioral Therapy and Insight-Oriented  Diagnosis: F43.23  Plan: Plan of Care: Recommend ongoing therapy.    Pt participated in setting treatment goals.   Plan to meet monthly.   Treatment Plan    Client Abilities/Strengths   Pt is bright, engaging, and motivated for therapy.  Client Treatment Preferences   Individual therapy.  Client Statement of Needs   Improve copings skills and have a safe place to talk about issues.   Symptoms   Depressed or irritable mood. Excessive and/or unrealistic worry that is difficult to control occurring more days than not for at least 6 months about a number of events or activities.  Problems Addressed   Unipolar Depression, Anxiety Goals  1. Alleviate depressive symptoms and return to previous level of effective functioning.  2. Appropriately grieve the loss in order to normalize mood and to return to previously adaptive level of functioning.  Objective  Learn and implement behavioral strategies to overcome depression. Target Date: 2023-08-05 Frequency: monthly  Progress: 50 Modality: individual  Related Interventions  Assist the client in developing skills that increase the likelihood of deriving pleasure from behavioral activation (e.g., assertiveness skills, developing an exercise plan, less internal/more external focus, increased social involvement); reinforce success. Engage the client in "behavioral activation," increasing his/her activity level and contact with sources of reward, while identifying processes that inhibit activation. use behavioral techniques such as instruction, rehearsal, role-playing, role reversal, as needed, to facilitate activity in the client's daily life; reinforce success. 3. Develop healthy interpersonal relationships that lead to the alleviation and help prevent the relapse of depression.  4. Develop healthy thinking patterns and beliefs about self, others, and the world that lead to the  alleviation and help prevent the relapse of depression.  5. Enhance ability to effectively cope with the full variety of life's worries  and anxieties.  6. Learn and implement coping skills that result in a reduction of anxiety and worry, and improved daily functioning.  Objective  Learn and implement problem-solving strategies for realistically addressing worries. Target Date: 2023-08-05 Frequency: monthly  Progress: 50 Modality: individual  Related Interventions  Assign the client a homework exercise in which he/she problem-solves a current problem (see Mastery of Your Anxiety and Worry: Workbook by Elenora Fender and Filbert Schilder or Generalized Anxiety Disorder by Elesa Hacker, and Filbert Schilder); review, reinforce success, and provide corrective feedback toward improvement. Teach the client problem-solving strategies involving specifically defining a problem, generating options for addressing it, evaluating the pros and cons of each option, selecting and implementing an optional action, and reevaluating and refining the action. Objective  Learn and implement calming skills to reduce overall anxiety and manage anxiety symptoms. Target Date: 2023-08-05 Frequency: monthly  Progress: 50 Modality: individual  Related Interventions  Assign the client to read about progressive muscle relaxation and other calming strategies in relevant books or treatment manuals (e.g., Progressive Relaxation Training by Twana First; Mastery of Your Anxiety and Worry: Workbook by Earlie Counts). Assign the client homework each session in which he/she practices relaxation exercises daily, gradually applying them progressively from non-anxiety-provoking to anxiety-provoking situations; review and reinforce success while providing corrective feedback toward improvement. Teach the client calming/relaxation skills (e.g., applied relaxation, progressive muscle relaxation, cue controlled relaxation; mindful breathing; biofeedback) and how to discriminate better between relaxation and tension; teach the client how to apply these skills to his/her daily life. 7.  Recognize, accept, and cope with feelings of depression.  8. Reduce overall frequency, intensity, and duration of the anxiety so that daily functioning is not impaired.  9. Resolve the core conflict that is the source of anxiety.  10. Stabilize anxiety level while increasing ability to function on a daily basis.  Diagnosis    F43.23   Conditions For Discharge  Achievement of treatment goals and objectives   Salomon Fick, LCSW

## 2023-02-11 ENCOUNTER — Other Ambulatory Visit: Payer: Self-pay | Admitting: Family Medicine

## 2023-02-11 DIAGNOSIS — E1165 Type 2 diabetes mellitus with hyperglycemia: Secondary | ICD-10-CM

## 2023-03-03 ENCOUNTER — Ambulatory Visit (INDEPENDENT_AMBULATORY_CARE_PROVIDER_SITE_OTHER): Payer: 59 | Admitting: Psychology

## 2023-03-03 DIAGNOSIS — F4323 Adjustment disorder with mixed anxiety and depressed mood: Secondary | ICD-10-CM
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
## 2023-03-25 ENCOUNTER — Other Ambulatory Visit: Payer: Self-pay | Admitting: Family Medicine

## 2023-03-25 DIAGNOSIS — E1169 Type 2 diabetes mellitus with other specified complication: Secondary | ICD-10-CM

## 2023-03-31 ENCOUNTER — Ambulatory Visit (INDEPENDENT_AMBULATORY_CARE_PROVIDER_SITE_OTHER): Payer: 59 | Admitting: Psychology

## 2023-03-31 DIAGNOSIS — F4323 Adjustment disorder with mixed anxiety and depressed mood: Secondary | ICD-10-CM
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
## 2023-04-13 ENCOUNTER — Other Ambulatory Visit: Payer: Self-pay | Admitting: Family Medicine

## 2023-04-13 DIAGNOSIS — I10 Essential (primary) hypertension: Secondary | ICD-10-CM

## 2023-04-23 ENCOUNTER — Ambulatory Visit: Payer: 59 | Admitting: Family Medicine

## 2023-04-23 ENCOUNTER — Encounter: Payer: Self-pay | Admitting: Family Medicine

## 2023-04-23 VITALS — BP 120/70 | HR 99 | Temp 98.9°F | Resp 18 | Ht 65.0 in | Wt 206.2 lb

## 2023-04-23 DIAGNOSIS — Z7984 Long term (current) use of oral hypoglycemic drugs: Secondary | ICD-10-CM | POA: Diagnosis not present

## 2023-04-23 DIAGNOSIS — E1165 Type 2 diabetes mellitus with hyperglycemia: Secondary | ICD-10-CM | POA: Diagnosis not present

## 2023-04-23 DIAGNOSIS — Z23 Encounter for immunization: Secondary | ICD-10-CM

## 2023-04-23 MED ORDER — RYBELSUS 3 MG PO TABS: 3.0000 mg | ORAL_TABLET | Freq: Every day | ORAL | 0 refills | Status: DC

## 2023-04-23 MED ORDER — RYBELSUS 7 MG PO TABS
7.0000 mg | ORAL_TABLET | Freq: Every day | ORAL | Status: DC
Start: 2023-04-23 — End: 2023-12-10

## 2023-04-23 NOTE — Progress Notes (Signed)
Established Patient Office Visit  Subjective   Patient ID: Jasmine Hinton, female    DOB: 21-Apr-1971  Age: 52 y.o. MRN: [id number]  Chief Complaint  Patient presents with   Medication Management    HPI Discussed the use of AI scribe software for clinical note transcription with the patient, who gave verbal consent to proceed.  History of Present Illness   The patient, with a history of diabetes, presents with concerns about managing her condition. She expresses anxiety about her health, particularly in light of a friend's recent death from complications related to diabetes and kidney failure. The patient is currently on Metformin and has been prescribed Ozempic, but is unable to afford it. She has been given a supply of Rybelsus, a similar medication, by the husband of her deceased friend and is seeking advice on whether to take it. The patient is also dealing with anxiety related to food and carbohydrate intake, and is making efforts to control her diet, including measuring out creamer for her coffee and counting out a specific number of potato chips. She expresses frustration with her insurance company's lack of coverage for her prescribed medication and the high cost of the medication. The patient also mentions a desire to see a nutritionist to help manage her diet and carbohydrate intake.      Patient Active Problem List   Diagnosis Date Noted   Concussion with no loss of consciousness 10/21/2016   Carpal tunnel syndrome 10/18/2015   Right knee pain 10/22/2012   Hyperlipidemia LDL goal <70 10/22/2012   Acute upper respiratory infection 08/21/2010   VAGINITIS, CANDIDAL 01/29/2010   Sinusitis, chronic 08/23/2009   KELOID 05/10/2009   Acute sinusitis 02/08/2009   FIBROIDS, UTERUS 11/24/2008   Vaginitis and vulvovaginitis 09/18/2008   FATIGUE 09/08/2008   CARDIAC MURMUR 09/08/2008   Anxiety state 11/03/2007   BACK PAIN, THORACIC REGION 05/29/2007   BACK STRAIN, LUMBAR  05/29/2007   Diabetes mellitus, type II (HCC) 01/08/2007   OBESITY NOS 01/08/2007   SCARLET FEVER 11/03/2006   DEPRESSION 11/03/2006   Essential hypertension 11/03/2006   Pregnancy with abortive outcome 02/11/2005   Past Medical History:  Diagnosis Date   Anxiety    Asthma    Depression    Diabetes mellitus    Hypertension    Past Surgical History:  Procedure Laterality Date   TONSILLECTOMY     Social History   Tobacco Use   Smoking status: Never   Smokeless tobacco: Never  Vaping Use   Vaping status: Never Used  Substance Use Topics   Alcohol use: Yes    Comment: rare   Drug use: No   Social History   Socioeconomic History   Marital status: Married    Spouse name: william   Number of children: Not on file   Years of education: Not on file   Highest education level: Bachelor's degree (e.g., BA, AB, BS)  Occupational History   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    Comment: one partner currently  Other Topics Concern   Not on file  Social History Narrative   Not on file   Social Determinants of Health  Financial Resource Strain: Not on file  Food Insecurity: No Food Insecurity (10/27/2022)   Hunger Vital Sign    Worried About Running Out of Food in the Last Year: Never true    Ran Out of Food in the Last Year: Never true  Transportation Needs: No Transportation Needs (10/27/2022)   PRAPARE - Administrator, Civil Service (Medical): No    Lack of Transportation (Non-Medical): No  Physical Activity: Insufficiently Active (10/27/2022)   Exercise Vital Sign    Days of Exercise per Week: 1 day    Minutes of Exercise per Session: 30 min  Stress: No Stress Concern Present  (10/27/2022)   [employer] of Occupational Health - Occupational Stress Questionnaire    Feeling of Stress : Only a little  Social Connections: Unknown (10/27/2022)   Social Connection and Isolation Panel [NHANES]    Frequency of Communication with Friends and Family: More than three times a week    Frequency of Social Gatherings with Friends and Family: Three times a week    Attends Religious Services: Patient declined    Active Member of Clubs or Organizations: Patient declined    Attends Engineer, structural: Not on file    Marital Status: Married  Catering manager Violence: Not on file   Family Status  Relation Name Status   Mother  Alive   Father  Alive   MGF  (Not Specified)   Other  (Not Specified)   Other  (Not Specified)   Other  (Not Specified)   Neg Hx  (Not Specified)  No partnership data on file   Family History  Problem Relation Age of Onset   Hypertension Mother    Thyroid disease Mother    Diabetes Mother    Diabetes Father    COPD Father    Colon polyps Maternal Grandfather    Colon cancer Maternal Grandfather 1   Diabetes Other    Hypertension Other    Depression Other    Esophageal cancer Neg Hx    Rectal cancer Neg Hx    Stomach cancer Neg Hx    No Known Allergies    Review of Systems  Constitutional:  Negative for chills, fever and malaise/fatigue.  HENT:  Negative for congestion and hearing loss.   Eyes:  Negative for discharge.  Respiratory:  Negative for cough, sputum production and shortness of breath.   Cardiovascular:  Negative for chest pain, palpitations and leg swelling.  Gastrointestinal:  Negative for abdominal pain, blood in stool, constipation, diarrhea, heartburn, nausea and vomiting.  Genitourinary:  Negative for dysuria, frequency, hematuria and urgency.  Musculoskeletal:  Negative for back pain, falls and myalgias.  Skin:  Negative for rash.  Neurological:  Negative for dizziness, sensory change, loss of  consciousness, weakness and headaches.  Endo/Heme/Allergies:  Negative for environmental allergies. Does not bruise/bleed easily.  Psychiatric/Behavioral:  Negative for depression and suicidal ideas. The patient is not nervous/anxious and does not have insomnia.       Objective:     BP 120/70 (BP Location: Left Arm, Patient Position: Sitting, Cuff Size: Large)   Pulse 99   Temp 98.9 F (37.2 C) (Oral)   Resp 18   Ht 5\' 5"  (1.651 m)   Wt 206 lb 3.2 oz (93.5 kg)   SpO2 98%   BMI 34.31 kg/m  BP Readings from Last 3 Encounters:  04/23/23 120/70  11/25/22 120/80  10/28/22 126/80   Wt Readings from Last 3 Encounters:  04/23/23 206  lb 3.2 oz (93.5 kg)  11/25/22 207 lb 6.4 oz (94.1 kg)  10/28/22 209 lb 2 oz (94.9 kg)   SpO2 Readings from Last 3 Encounters:  04/23/23 98%  10/28/22 99%  05/29/22 99%      Physical Exam Vitals and nursing note reviewed.  Constitutional:      General: She is not in acute distress.    Appearance: Normal appearance. She is well-developed.  HENT:     Head: Normocephalic and atraumatic.  Eyes:     General: No scleral icterus.       Right eye: No discharge.        Left eye: No discharge.  Cardiovascular:     Rate and Rhythm: Normal rate and regular rhythm.     Heart sounds: No murmur heard. Pulmonary:     Effort: Pulmonary effort is normal. No respiratory distress.     Breath sounds: Normal breath sounds.  Musculoskeletal:        General: Normal range of motion.     Cervical back: Normal range of motion and neck supple.     Right lower leg: No edema.     Left lower leg: No edema.  Skin:    General: Skin is warm and dry.  Neurological:     Mental Status: She is alert and oriented to person, place, and time.  Psychiatric:        Mood and Affect: Mood normal.        Behavior: Behavior normal.        Thought Content: Thought content normal.        Judgment: Judgment normal.      No results found for any visits on 04/23/23.  Last  CBC Lab Results  Component Value Date   WBC 4.4 10/28/2022   HGB 13.0 10/28/2022   HCT 39.0 10/28/2022   MCV 87.8 10/28/2022   RDW 14.1 10/28/2022   PLT 287.0 10/28/2022   Last metabolic panel Lab Results  Component Value Date   GLUCOSE 165 (H) 10/28/2022   NA 136 10/28/2022   K 3.7 10/28/2022   CL 101 10/28/2022   CO2 28 10/28/2022   BUN 9 10/28/2022   CREATININE 0.56 10/28/2022   GFR 107.23 10/28/2022   CALCIUM 9.1 10/28/2022   PROT 6.6 10/28/2022   ALBUMIN 4.2 10/28/2022   BILITOT 0.5 10/28/2022   ALKPHOS 73 10/28/2022   AST 10 10/28/2022   ALT 19 10/28/2022   Last lipids Lab Results  Component Value Date   CHOL 119 10/28/2022   HDL 40.40 10/28/2022   LDLCALC 67 10/28/2022   TRIG 57.0 10/28/2022   CHOLHDL 3 10/28/2022   Last hemoglobin A1c Lab Results  Component Value Date   HGBA1C 8.6 (H) 10/28/2022   Last thyroid functions Lab Results  Component Value Date   TSH 1.43 10/28/2022   Last vitamin D No results found for: "25OHVITD2", "25OHVITD3", "VD25OH" Last vitamin B12 and Folate Lab Results  Component Value Date   VITAMINB12 317 09/08/2008   FOLATE 12.5 09/08/2008      The ASCVD Risk score (Arnett DK, et al., 2019) failed to calculate for the following reasons:   The valid total cholesterol range is 130 to 320 mg/dL    Assessment & Plan:   Problem List Items Addressed This Visit       Unprioritized   Diabetes mellitus, type II (HCC) - Primary   Relevant Medications   Semaglutide (RYBELSUS) 7 MG TABS   Semaglutide (RYBELSUS) 3 MG  TABS   Other Relevant Orders   Ambulatory referral to diabetic education   Other Visit Diagnoses     Need for influenza vaccination       Relevant Orders   Flu vaccine trivalent PF, 6mos and older(Flulaval,Afluria,Fluarix,Fluzone) (Completed)     Assessment and Plan    Type 2 Diabetes Mellitus Patient has been struggling with medication costs and adherence. She has been offered a supply of Rybelsus 7mg   by a friend, which is essentially Ozempic in pill form. Patient is currently on Metformin and was previously prescribed Ozempic, but could not afford it. She is concerned about potential side effects of starting at 7mg  of Rybelsus. -Continue Metformin. -Start Rybelsus 7mg  daily, with caution regarding potential nausea. -Check blood glucose levels regularly. -Consider splitting the 7mg  tablets if nausea occurs, despite the official recommendation against splitting tablets. -Order labs in 3 months to assess response to new medication.  Anxiety related to diet and diabetes management Patient is experiencing anxiety related to food intake and carbohydrate counting, likely exacerbated by the recent death of a friend from complications of diabetes. She is currently seeing a therapist. -Encourage continued therapy. -Refer to a nutritionist to help with meal planning and carbohydrate counting. -Consider use of a calorie tracking app like MyFitnessPal to ensure adequate caloric intake. -Consider use of a continuous glucose monitor when it becomes available over the counter to help with blood sugar management.        No follow-ups on file.    Donato Schultz, DO

## 2023-04-28 ENCOUNTER — Ambulatory Visit (INDEPENDENT_AMBULATORY_CARE_PROVIDER_SITE_OTHER): Payer: 59 | Admitting: Psychology

## 2023-04-28 DIAGNOSIS — F4323 Adjustment disorder with mixed anxiety and depressed mood: Secondary | ICD-10-CM

## 2023-04-28 NOTE — Progress Notes (Signed)
[facility] Counselor/Therapist Progress Note  Patient ID: Jasmine Hinton, MRN: [id number],    Date: 04/28/2023  Time Spent: 12:00pm-12:50pm    50 minutes   Treatment Type: Individual Therapy  Reported Symptoms: stress  Mental Status Exam: Appearance:  Casual     Behavior: Appropriate  Motor: Normal  Speech/Language:  Normal Rate  Affect: Appropriate  Mood: normal  Thought process: normal  Thought content:   WNL  Sensory/Perceptual disturbances:   WNL  Orientation: oriented to person, place, time/date, and situation  Attention: Good  Concentration: Good  Memory: WNL  Fund of knowledge:  Good  Insight:   Good  Judgment:  Good  Impulse Control: Good   Risk Assessment: Danger to Self:  No Self-injurious Behavior: No Danger to Others: No Duty to Warn:no Physical Aggression / Violence:No  Access to Firearms a concern: No  Gang Involvement:No   Subjective: Pt present for face-to-face individual therapy via video.  Pt consents to telehealth video session and is aware of limitations and benefits of virtual sessions. Location of pt: home Location of therapist: home office.  Pt talked about going on vacation next week.  Pt and husband will go to [place] and tour the museums.   Pt is looking forward to the trip and being away from work.   Pt talked about starting new medication to lose weight and regulate her blood sugar.   Pt is having side effects of upset stomach and nausea.  Pt became scared about her health when her friend died recently from complications of diabetes.  Pt has diabetes and wants to control her blood sugars better.  Addressed pt's health worries.   Pt talked about her family dynamics with her mother and grandparents.  Pt gets frustrated with the interactions at times.   Addressed the issues and helped pt process her feelings and relationship dynamics.   Worked on increasing self care.   Provided supportive therapy.   Interventions: Cognitive  Behavioral Therapy and Insight-Oriented  Diagnosis: F43.23  Plan: Plan of Care: Recommend ongoing therapy.   Pt participated in setting treatment goals.   Plan to meet monthly.   Treatment Plan    Client Abilities/Strengths   Pt is bright, engaging, and motivated for therapy.  Client Treatment Preferences   Individual therapy.  Client Statement of Needs   Improve copings skills and have a safe place to talk about issues.   Symptoms   Depressed or irritable mood. Excessive and/or unrealistic worry that is difficult to control occurring more days than not for at least 6 months about a number of events or activities.  Problems Addressed   Unipolar Depression, Anxiety Goals  1. Alleviate depressive symptoms and return to previous level of effective functioning.  2. Appropriately grieve the loss in order to normalize mood and to return to previously adaptive level of functioning.  Objective  Learn and implement behavioral strategies to overcome depression. Target Date: 2023-08-05 Frequency: monthly  Progress: 50 Modality: individual  Related Interventions  Assist the client in developing skills that increase the likelihood of deriving pleasure from behavioral activation (e.g., assertiveness skills, developing an exercise plan, less internal/more external focus, increased social involvement); reinforce success. Engage the client in "behavioral activation," increasing his/her activity level and contact with sources of reward, while identifying processes that inhibit activation. use behavioral techniques such as instruction, rehearsal, role-playing, role reversal, as needed, to facilitate activity in the client's daily life; reinforce success. 3. Develop healthy interpersonal relationships that lead  to the alleviation and help prevent the relapse of depression.  4. Develop healthy thinking patterns and beliefs about self, others, and the world that lead to the alleviation and help  prevent the relapse of depression.  5. Enhance ability to effectively cope with the full variety of life's worries and anxieties.  6. Learn and implement coping skills that result in a reduction of anxiety and worry, and improved daily functioning.  Objective  Learn and implement problem-solving strategies for realistically addressing worries. Target Date: 2023-08-05 Frequency: monthly  Progress: 50 Modality: individual  Related Interventions  Assign the client a homework exercise in which he/she problem-solves a current problem (see Mastery of Your Anxiety and Worry: Workbook by Elenora Fender and Filbert Schilder or Generalized Anxiety Disorder by Elesa Hacker, and Filbert Schilder); review, reinforce success, and provide corrective feedback toward improvement. Teach the client problem-solving strategies involving specifically defining a problem, generating options for addressing it, evaluating the pros and cons of each option, selecting and implementing an optional action, and reevaluating and refining the action. Objective  Learn and implement calming skills to reduce overall anxiety and manage anxiety symptoms. Target Date: 2023-08-05 Frequency: monthly  Progress: 50 Modality: individual  Related Interventions  Assign the client to read about progressive muscle relaxation and other calming strategies in relevant books or treatment manuals (e.g., Progressive Relaxation Training by Twana First; Mastery of Your Anxiety and Worry: Workbook by Earlie Counts). Assign the client homework each session in which he/she practices relaxation exercises daily, gradually applying them progressively from non-anxiety-provoking to anxiety-provoking situations; review and reinforce success while providing corrective feedback toward improvement. Teach the client calming/relaxation skills (e.g., applied relaxation, progressive muscle relaxation, cue controlled relaxation; mindful breathing; biofeedback) and how to  discriminate better between relaxation and tension; teach the client how to apply these skills to his/her daily life. 7. Recognize, accept, and cope with feelings of depression.  8. Reduce overall frequency, intensity, and duration of the anxiety so that daily functioning is not impaired.  9. Resolve the core conflict that is the source of anxiety.  10. Stabilize anxiety level while increasing ability to function on a daily basis.  Diagnosis    F43.23   Conditions For Discharge  Achievement of treatment goals and objectives   Salomon Fick, LCSW

## 2023-05-01 ENCOUNTER — Other Ambulatory Visit (HOSPITAL_COMMUNITY): Payer: Self-pay

## 2023-05-07 ENCOUNTER — Other Ambulatory Visit: Payer: Self-pay | Admitting: Family Medicine

## 2023-05-07 DIAGNOSIS — F418 Other specified anxiety disorders: Secondary | ICD-10-CM

## 2023-05-19 ENCOUNTER — Other Ambulatory Visit: Payer: Self-pay | Admitting: Family Medicine

## 2023-05-19 DIAGNOSIS — J4520 Mild intermittent asthma, uncomplicated: Secondary | ICD-10-CM

## 2023-05-26 ENCOUNTER — Ambulatory Visit: Payer: 59 | Admitting: Psychology

## 2023-05-26 DIAGNOSIS — F4323 Adjustment disorder with mixed anxiety and depressed mood: Secondary | ICD-10-CM

## 2023-05-26 NOTE — Progress Notes (Signed)
[facility] Counselor/Therapist Progress Note  Patient ID: HALAYA DENNE, MRN: [id number],    Date: 05/26/2023  Time Spent: 12:00pm-12:55pm    55 minutes   Treatment Type: Individual Therapy  Reported Symptoms: stress  Mental Status Exam: Appearance:  Casual     Behavior: Appropriate  Motor: Normal  Speech/Language:  Normal Rate  Affect: Appropriate  Mood: normal  Thought process: normal  Thought content:   WNL  Sensory/Perceptual disturbances:   WNL  Orientation: oriented to person, place, time/date, and situation  Attention: Good  Concentration: Good  Memory: WNL  Fund of knowledge:  Good  Insight:   Good  Judgment:  Good  Impulse Control: Good   Risk Assessment: Danger to Self:  No Self-injurious Behavior: No Danger to Others: No Duty to Warn:no Physical Aggression / Violence:No  Access to Firearms a concern: No  Gang Involvement:No   Subjective: Pt present for face-to-face individual therapy via video.  Pt consents to telehealth video session and is aware of limitations and benefits of virtual sessions. Location of pt: home Location of therapist: home office.  Pt talked about how she feels about the presidential election results.   She is very disappointed.   Pt has a friend who voted for Trump and they have very opposing political views.   Pt is having trouble navigating that friendship since the election.  Helped pt process her thoughts and feelings and relationship dynamics.   Pt has been feeling more anxious and fearful since the elections.   Addressed pt's fears regarding issues of racism and gun control.   Helped pt process her feelings.  Worked on calming strategies.  Worked on increasing self care.   Provided supportive therapy.   Interventions: Cognitive Behavioral Therapy and Insight-Oriented  Diagnosis: F43.23  Plan: Plan of Care: Recommend ongoing therapy.   Pt participated in setting treatment goals.   Plan to meet monthly.    Treatment Plan    Client Abilities/Strengths   Pt is bright, engaging, and motivated for therapy.  Client Treatment Preferences   Individual therapy.  Client Statement of Needs   Improve copings skills and have a safe place to talk about issues.   Symptoms   Depressed or irritable mood. Excessive and/or unrealistic worry that is difficult to control occurring more days than not for at least 6 months about a number of events or activities.  Problems Addressed   Unipolar Depression, Anxiety Goals  1. Alleviate depressive symptoms and return to previous level of effective functioning.  2. Appropriately grieve the loss in order to normalize mood and to return to previously adaptive level of functioning.  Objective  Learn and implement behavioral strategies to overcome depression. Target Date: 2023-08-05 Frequency: monthly  Progress: 50 Modality: individual  Related Interventions  Assist the client in developing skills that increase the likelihood of deriving pleasure from behavioral activation (e.g., assertiveness skills, developing an exercise plan, less internal/more external focus, increased social involvement); reinforce success. Engage the client in "behavioral activation," increasing his/her activity level and contact with sources of reward, while identifying processes that inhibit activation. use behavioral techniques such as instruction, rehearsal, role-playing, role reversal, as needed, to facilitate activity in the client's daily life; reinforce success. 3. Develop healthy interpersonal relationships that lead to the alleviation and help prevent the relapse of depression.  4. Develop healthy thinking patterns and beliefs about self, others, and the world that lead to the alleviation and help prevent the relapse of depression.  5.  Enhance ability to effectively cope with the full variety of life's worries and anxieties.  6. Learn and implement coping skills that result in  a reduction of anxiety and worry, and improved daily functioning.  Objective  Learn and implement problem-solving strategies for realistically addressing worries. Target Date: 2023-08-05 Frequency: monthly  Progress: 50 Modality: individual  Related Interventions  Assign the client a homework exercise in which he/she problem-solves a current problem (see Mastery of Your Anxiety and Worry: Workbook by Elenora Fender and Filbert Schilder or Generalized Anxiety Disorder by Elesa Hacker, and Filbert Schilder); review, reinforce success, and provide corrective feedback toward improvement. Teach the client problem-solving strategies involving specifically defining a problem, generating options for addressing it, evaluating the pros and cons of each option, selecting and implementing an optional action, and reevaluating and refining the action. Objective  Learn and implement calming skills to reduce overall anxiety and manage anxiety symptoms. Target Date: 2023-08-05 Frequency: monthly  Progress: 50 Modality: individual  Related Interventions  Assign the client to read about progressive muscle relaxation and other calming strategies in relevant books or treatment manuals (e.g., Progressive Relaxation Training by Twana First; Mastery of Your Anxiety and Worry: Workbook by Earlie Counts). Assign the client homework each session in which he/she practices relaxation exercises daily, gradually applying them progressively from non-anxiety-provoking to anxiety-provoking situations; review and reinforce success while providing corrective feedback toward improvement. Teach the client calming/relaxation skills (e.g., applied relaxation, progressive muscle relaxation, cue controlled relaxation; mindful breathing; biofeedback) and how to discriminate better between relaxation and tension; teach the client how to apply these skills to his/her daily life. 7. Recognize, accept, and cope with feelings of depression.  8. Reduce  overall frequency, intensity, and duration of the anxiety so that daily functioning is not impaired.  9. Resolve the core conflict that is the source of anxiety.  10. Stabilize anxiety level while increasing ability to function on a daily basis.  Diagnosis    F43.23   Conditions For Discharge  Achievement of treatment goals and objectives   Salomon Fick, LCSW

## 2023-06-15 ENCOUNTER — Ambulatory Visit: Payer: 59 | Admitting: Dietician

## 2023-06-15 ENCOUNTER — Other Ambulatory Visit: Payer: Self-pay | Admitting: Family Medicine

## 2023-06-15 DIAGNOSIS — E1165 Type 2 diabetes mellitus with hyperglycemia: Secondary | ICD-10-CM

## 2023-06-23 ENCOUNTER — Ambulatory Visit: Payer: 59 | Admitting: Psychology

## 2023-06-23 DIAGNOSIS — F4323 Adjustment disorder with mixed anxiety and depressed mood: Secondary | ICD-10-CM | POA: Diagnosis not present

## 2023-06-23 NOTE — Progress Notes (Signed)
[facility] Counselor/Therapist Progress Note  Patient ID: Jasmine Hinton, MRN: [id number],    Date: 06/23/2023  Time Spent: 12:00pm-12:55pm    55 minutes   Treatment Type: Individual Therapy  Reported Symptoms: stress  Mental Status Exam: Appearance:  Casual     Behavior: Appropriate  Motor: Normal  Speech/Language:  Normal Rate  Affect: Appropriate  Mood: normal  Thought process: normal  Thought content:   WNL  Sensory/Perceptual disturbances:   WNL  Orientation: oriented to person, place, time/date, and situation  Attention: Good  Concentration: Good  Memory: WNL  Fund of knowledge:  Good  Insight:   Good  Judgment:  Good  Impulse Control: Good   Risk Assessment: Danger to Self:  No Self-injurious Behavior: No Danger to Others: No Duty to Warn:no Physical Aggression / Violence:No  Access to Firearms a concern: No  Gang Involvement:No   Subjective: Pt present for face-to-face individual therapy via video.  Pt consents to telehealth video session and is aware of limitations and benefits of virtual sessions. Location of pt: home Location of therapist: home office.  Pt talked about her birthday on [birth year].   Pt is going to have a [employer] birthday party with a bunch of her friends at [employer].   Pt and one of her friends are both turning 52.  Pt is looking forward to the celebration. Addressed pt's thoughts and feelings about aging.  She worries about looking older.  She wonders about getting botox but does not want to be vain.   Pt talked about Thanksgiving.   She was with family.   Pt's mother annoys her.   Addressed their interactions and pt's frustrations.  Helped pt process her feelings and family dynamics.  Identified that pt tends to get into circular arguments with her mother.   Addressed how she can practice not engaging and setting healthy boundaries.   Worked on increasing self care.   Provided supportive therapy.    Interventions: Cognitive Behavioral Therapy and Insight-Oriented  Diagnosis: F43.23  Plan: Plan of Care: Recommend ongoing therapy.   Pt participated in setting treatment goals.   Plan to meet monthly.   Treatment Plan    Client Abilities/Strengths   Pt is bright, engaging, and motivated for therapy.  Client Treatment Preferences   Individual therapy.  Client Statement of Needs   Improve copings skills and have a safe place to talk about issues.   Symptoms   Depressed or irritable mood. Excessive and/or unrealistic worry that is difficult to control occurring more days than not for at least 6 months about a number of events or activities.  Problems Addressed   Unipolar Depression, Anxiety Goals  1. Alleviate depressive symptoms and return to previous level of effective functioning.  2. Appropriately grieve the loss in order to normalize mood and to return to previously adaptive level of functioning.  Objective  Learn and implement behavioral strategies to overcome depression. Target Date: 2023-08-05 Frequency: monthly  Progress: 50 Modality: individual  Related Interventions  Assist the client in developing skills that increase the likelihood of deriving pleasure from behavioral activation (e.g., assertiveness skills, developing an exercise plan, less internal/more external focus, increased social involvement); reinforce success. Engage the client in "behavioral activation," increasing his/her activity level and contact with sources of reward, while identifying processes that inhibit activation. use behavioral techniques such as instruction, rehearsal, role-playing, role reversal, as needed, to facilitate activity in the client's daily life; reinforce success. 3. Develop  healthy interpersonal relationships that lead to the alleviation and help prevent the relapse of depression.  4. Develop healthy thinking patterns and beliefs about self, others, and the world that lead to  the alleviation and help prevent the relapse of depression.  5. Enhance ability to effectively cope with the full variety of life's worries and anxieties.  6. Learn and implement coping skills that result in a reduction of anxiety and worry, and improved daily functioning.  Objective  Learn and implement problem-solving strategies for realistically addressing worries. Target Date: 2023-08-05 Frequency: monthly  Progress: 50 Modality: individual  Related Interventions  Assign the client a homework exercise in which he/she problem-solves a current problem (see Mastery of Your Anxiety and Worry: Workbook by Elenora Fender and Filbert Schilder or Generalized Anxiety Disorder by Elesa Hacker, and Filbert Schilder); review, reinforce success, and provide corrective feedback toward improvement. Teach the client problem-solving strategies involving specifically defining a problem, generating options for addressing it, evaluating the pros and cons of each option, selecting and implementing an optional action, and reevaluating and refining the action. Objective  Learn and implement calming skills to reduce overall anxiety and manage anxiety symptoms. Target Date: 2023-08-05 Frequency: monthly  Progress: 50 Modality: individual  Related Interventions  Assign the client to read about progressive muscle relaxation and other calming strategies in relevant books or treatment manuals (e.g., Progressive Relaxation Training by Twana First; Mastery of Your Anxiety and Worry: Workbook by Earlie Counts). Assign the client homework each session in which he/she practices relaxation exercises daily, gradually applying them progressively from non-anxiety-provoking to anxiety-provoking situations; review and reinforce success while providing corrective feedback toward improvement. Teach the client calming/relaxation skills (e.g., applied relaxation, progressive muscle relaxation, cue controlled relaxation; mindful breathing;  biofeedback) and how to discriminate better between relaxation and tension; teach the client how to apply these skills to his/her daily life. 7. Recognize, accept, and cope with feelings of depression.  8. Reduce overall frequency, intensity, and duration of the anxiety so that daily functioning is not impaired.  9. Resolve the core conflict that is the source of anxiety.  10. Stabilize anxiety level while increasing ability to function on a daily basis.  Diagnosis    F43.23   Conditions For Discharge  Achievement of treatment goals and objectives   Salomon Fick, LCSW

## 2023-07-21 ENCOUNTER — Ambulatory Visit: Payer: 59 | Admitting: Psychology

## 2023-07-21 DIAGNOSIS — F4323 Adjustment disorder with mixed anxiety and depressed mood: Secondary | ICD-10-CM

## 2023-07-21 NOTE — Progress Notes (Signed)
 [facility] Counselor/Therapist Progress Note  Patient ID: Jasmine Hinton, MRN: [id number],    Date: 07/21/2023  Time Spent: 12:00pm-12:55pm    55 minutes   Treatment Type: Individual Therapy  Reported Symptoms: stress  Mental Status Exam: Appearance:  Casual     Behavior: Appropriate  Motor: Normal  Speech/Language:  Normal Rate  Affect: Appropriate  Mood: normal  Thought process: normal  Thought content:   WNL  Sensory/Perceptual disturbances:   WNL  Orientation: oriented to person, place, time/date, and situation  Attention: Good  Concentration: Good  Memory: WNL  Fund of knowledge:  Good  Insight:   Good  Judgment:  Good  Impulse Control: Good   Risk Assessment: Danger to Self:  No Self-injurious Behavior: No Danger to Others: No Duty to Warn:no Physical Aggression / Violence:No  Access to Firearms a concern: No  Gang Involvement:No   Subjective: Pt present for face-to-face individual therapy via video.  Pt consents to telehealth video session and is aware of limitations and benefits of virtual sessions. Location of pt: home Location of therapist: home office.  Pt talked about her birthday.  She had a 52th birthday party with her husband and best friends.   She had the [employer] party that she wanted.   Pt talked about her relationship with her step father.   He was like a father to pt.  He died in June 28, 2015.   Pt did not have a close relationship with her biological father.  Pt and her father have gotten closer the past few years but pt still misses her step father.  Helped pt process her feelings.   Pt talked about her relationship with her mother. Addressed that pt tends to get into circular arguments with her mother.   Addressed how she can practice not engaging and setting healthy boundaries.   Worked on increasing self care.   Provided supportive therapy.   Interventions: Cognitive Behavioral Therapy and Insight-Oriented  Diagnosis:  F43.23  Plan: Plan of Care: Recommend ongoing therapy.   Pt participated in setting treatment goals.   Plan to meet monthly.   Treatment Plan    Client Abilities/Strengths   Pt is bright, engaging, and motivated for therapy.  Client Treatment Preferences   Individual therapy.  Client Statement of Needs   Improve copings skills and have a safe place to talk about issues.   Symptoms   Depressed or irritable mood. Excessive and/or unrealistic worry that is difficult to control occurring more days than not for at least 6 months about a number of events or activities.  Problems Addressed   Unipolar Depression, Anxiety Goals  1. Alleviate depressive symptoms and return to previous level of effective functioning.  2. Appropriately grieve the loss in order to normalize mood and to return to previously adaptive level of functioning.  Objective  Learn and implement behavioral strategies to overcome depression. Target Date: 2023-08-05 Frequency: monthly  Progress: 50 Modality: individual  Related Interventions  Assist the client in developing skills that increase the likelihood of deriving pleasure from behavioral activation (e.g., assertiveness skills, developing an exercise plan, less internal/more external focus, increased social involvement); reinforce success. Engage the client in behavioral activation, increasing his/her activity level and contact with sources of reward, while identifying processes that inhibit activation. use behavioral techniques such as instruction, rehearsal, role-playing, role reversal, as needed, to facilitate activity in the client's daily life; reinforce success. 3. Develop healthy interpersonal relationships that lead to the alleviation and  help prevent the relapse of depression.  4. Develop healthy thinking patterns and beliefs about self, others, and the world that lead to the alleviation and help prevent the relapse of depression.  5. Enhance ability to  effectively cope with the full variety of life's worries and anxieties.  6. Learn and implement coping skills that result in a reduction of anxiety and worry, and improved daily functioning.  Objective  Learn and implement problem-solving strategies for realistically addressing worries. Target Date: 2023-08-05 Frequency: monthly  Progress: 50 Modality: individual  Related Interventions  Assign the client a homework exercise in which he/she problem-solves a current problem (see Mastery of Your Anxiety and Worry: Workbook by Richarda and Jonne or Generalized Anxiety Disorder by Delores Filler, and Jonne); review, reinforce success, and provide corrective feedback toward improvement. Teach the client problem-solving strategies involving specifically defining a problem, generating options for addressing it, evaluating the pros and cons of each option, selecting and implementing an optional action, and reevaluating and refining the action. Objective  Learn and implement calming skills to reduce overall anxiety and manage anxiety symptoms. Target Date: 2023-08-05 Frequency: monthly  Progress: 50 Modality: individual  Related Interventions  Assign the client to read about progressive muscle relaxation and other calming strategies in relevant books or treatment manuals (e.g., Progressive Relaxation Training by Thornell armin Collier; Mastery of Your Anxiety and Worry: Workbook by Richarda armin Jonne). Assign the client homework each session in which he/she practices relaxation exercises daily, gradually applying them progressively from non-anxiety-provoking to anxiety-provoking situations; review and reinforce success while providing corrective feedback toward improvement. Teach the client calming/relaxation skills (e.g., applied relaxation, progressive muscle relaxation, cue controlled relaxation; mindful breathing; biofeedback) and how to discriminate better between relaxation and tension; teach the  client how to apply these skills to his/her daily life. 7. Recognize, accept, and cope with feelings of depression.  8. Reduce overall frequency, intensity, and duration of the anxiety so that daily functioning is not impaired.  9. Resolve the core conflict that is the source of anxiety.  10. Stabilize anxiety level while increasing ability to function on a daily basis.  Diagnosis    F43.23   Conditions For Discharge  Achievement of treatment goals and objectives   Veva Alma, LCSW

## 2023-07-24 ENCOUNTER — Other Ambulatory Visit: Payer: 59

## 2023-07-29 ENCOUNTER — Other Ambulatory Visit (INDEPENDENT_AMBULATORY_CARE_PROVIDER_SITE_OTHER): Payer: 59

## 2023-07-29 DIAGNOSIS — E1169 Type 2 diabetes mellitus with other specified complication: Secondary | ICD-10-CM

## 2023-07-29 DIAGNOSIS — E1165 Type 2 diabetes mellitus with hyperglycemia: Secondary | ICD-10-CM

## 2023-07-29 DIAGNOSIS — E785 Hyperlipidemia, unspecified: Secondary | ICD-10-CM

## 2023-07-29 LAB — COMPREHENSIVE METABOLIC PANEL
ALT: 18 U/L (ref 0–35)
AST: 12 U/L (ref 0–37)
Albumin: 4.4 g/dL (ref 3.5–5.2)
Alkaline Phosphatase: 70 U/L (ref 39–117)
BUN: 9 mg/dL (ref 6–23)
CO2: 28 meq/L (ref 19–32)
Calcium: 9.5 mg/dL (ref 8.4–10.5)
Chloride: 102 meq/L (ref 96–112)
Creatinine, Ser: 0.64 mg/dL (ref 0.40–1.20)
GFR: 103.29 mL/min (ref >60.00)
Glucose, Bld: 99 mg/dL (ref 70–99)
Potassium: 4.2 meq/L (ref 3.5–5.1)
Sodium: 139 meq/L (ref 135–145)
Total Bilirubin: 0.5 mg/dL (ref 0.2–1.2)
Total Protein: 6.9 g/dL (ref 6.0–8.3)

## 2023-07-29 LAB — LIPID PANEL
Cholesterol: 132 mg/dL (ref 0–200)
HDL: 41.4 mg/dL (ref >39.00)
LDL Cholesterol: 80 mg/dL (ref 0–99)
NonHDL: 90.23
Total CHOL/HDL Ratio: 3
Triglycerides: 51 mg/dL (ref 0.0–149.0)
VLDL: 10.2 mg/dL (ref 0.0–40.0)

## 2023-07-29 LAB — HEMOGLOBIN A1C: Hgb A1c MFr Bld: 6.7 % — ABNORMAL HIGH (ref 4.6–6.5)

## 2023-08-02 ENCOUNTER — Encounter: Payer: Self-pay | Admitting: Family Medicine

## 2023-08-03 ENCOUNTER — Other Ambulatory Visit: Payer: Self-pay | Admitting: Family Medicine

## 2023-08-03 DIAGNOSIS — E1165 Type 2 diabetes mellitus with hyperglycemia: Secondary | ICD-10-CM

## 2023-08-07 ENCOUNTER — Ambulatory Visit: Payer: 59 | Admitting: Dietician

## 2023-08-12 ENCOUNTER — Other Ambulatory Visit: Payer: Self-pay | Admitting: Family Medicine

## 2023-08-12 DIAGNOSIS — J4520 Mild intermittent asthma, uncomplicated: Secondary | ICD-10-CM

## 2023-08-18 ENCOUNTER — Ambulatory Visit: Payer: 59 | Admitting: Psychology

## 2023-08-18 DIAGNOSIS — F4323 Adjustment disorder with mixed anxiety and depressed mood: Secondary | ICD-10-CM | POA: Diagnosis not present

## 2023-08-18 NOTE — Progress Notes (Signed)
 [facility] Counselor Initial Adult Exam  Name: Jasmine Hinton Date: 08/18/2023 MRN: [id number] DOB: Oct 18, 1970 PCP: Antonio Cyndee Jamee JONELLE, DO  Time spent: 12:00pm-12:55pm  55 minutes  Guardian/Payee:  debara Six requested: No   Reason for Visit /Presenting Problem: Subjective:  Pt present for face-to-face initial assessment update via video.   Pt consented to telehealth video session and is aware of limitations and benefits of virtual sessions.    Location of pt: work Location of therapist: home office. Pt wants to continue therapy to have a safe place to talk.    Pt states she is dealing with stress.  Pt is worried about the political issues and the state of the country.  Pt's sister's job is being impacted by the changes.  Pt is worried about her sister and feels angry about what the government is doing.  Pt has trouble dealing with anger and tends to suppress it.   Worked with pt on how she can express anger in healthy ways.   Pt's job has some stress and pt has family stress as well.  Addressed the family dynamics.  Reviewed pt's treatment plan for annual update.   Updated pt's treatment plan and IA.   Pt participated in setting treatment goals.   Plan to continue to meet monthly.    Mental Status Exam: Appearance:   Casual     Behavior:  Appropriate  Motor:  Normal  Speech/Language:   Normal Rate  Affect:  Appropriate  Mood:  normal  Thought process:  normal  Thought content:    WNL  Sensory/Perceptual disturbances:    WNL  Orientation:  oriented to person, place, time/date, and situation  Attention:  Good  Concentration:  Good  Memory:  WNL  Fund of knowledge:   Good  Insight:    Good  Judgment:   Good  Impulse Control:  Good    Reported Symptoms:  stress  Risk Assessment: Danger to Self:  No Self-injurious Behavior: No Danger to Others: No Duty to Warn:no Physical Aggression / Violence:No  Access to Firearms a concern: No  Gang  Involvement:No  Patient / guardian was educated about steps to take if suicide or homicide risk level increases between visits: no While future psychiatric events cannot be accurately predicted, the patient does not currently require acute inpatient psychiatric care and does not currently meet [facility]  involuntary commitment criteria.  Substance Abuse History: Current substance abuse: No     Past Psychiatric History:   No previous psychological problems have been observed Outpatient Providers:n/a History of Psych Hospitalization: No  Psychological Testing:  n/a    Abuse History:  Victim of: No.,  none    Report needed: No. Victim of Neglect:No. Perpetrator of  n/a   Witness / Exposure to Domestic Violence: No   Protective Services Involvement: No  Witness to [employer] Violence:  No   Family History:  Family History  Problem Relation Age of Onset   Hypertension Mother    Thyroid  disease Mother    Diabetes Mother    Diabetes Father    COPD Father    Colon polyps Maternal Grandfather    Colon cancer Maternal Grandfather 73   Diabetes Other    Hypertension Other    Depression Other    Esophageal cancer Neg Hx    Rectal cancer Neg Hx    Stomach cancer Neg Hx     Living situation: the patient lives with her spouse.  Pt grew  up with mother.  Her parents got divorced when pt was 4 yrs old.  Pt had a stepfather who she describes as great to her.   Pt has a Bonnette half sister and an older stepsister.    Pt has an older half sister and a [place] half brother from her father.   Pt and stepsister are very close.   Pt did not see her father very much when she was growing up.   Family history of mental illness:  maternal grandmother had depression. Pt states her family was not affectionate.  Pt knew her mother loved her but she rarely was told that or hugged.  Pt states that her mother was a perfect mom for her sister but not a great mom for her.   Pt has worked on the  relationship with her mother in adulthood.   No childhood abuse.    Sexual Orientation: Straight  Relationship Status: married  Name of spouse / other:william If a parent, number of children / ages:n/a  Support Systems: spouse friends parents  Financial Stress:  No   Income/Employment/Disability: Employment Pt has a job at a art gallery manager. Military Service: No   Educational History: Education: some college  Religion/Sprituality/World View: N/a  Any cultural differences that may affect / interfere with treatment:  not applicable   Recreation/Hobbies: reading  Stressors: Marital or family conflict   Occupational concerns    Strengths: Supportive Relationships, Hopefulness, Self Advocate, and Able to Communicate Effectively  Barriers:  none   Legal History: Pending legal issue / charges: The patient has no significant history of legal issues. History of legal issue / charges:  none  Medical History/Surgical History: reviewed Past Medical History:  Diagnosis Date   Anxiety    Asthma    Depression    Diabetes mellitus    Hypertension     Past Surgical History:  Procedure Laterality Date   TONSILLECTOMY      Medications: Current Outpatient Medications  Medication Sig Dispense Refill   albuterol  (PROAIR  HFA) 108 (90 Base) MCG/ACT inhaler Inhale 2 puffs into the lungs every 6 (six) hours as needed for wheezing or shortness of breath. 1 each 5   atorvastatin  (LIPITOR) 20 MG tablet Take 1 tablet (20 mg total) by mouth daily. 90 tablet 1   citalopram  (CELEXA ) 20 MG tablet Take 1 tablet by mouth once daily 90 tablet 1   Continuous Glucose Sensor (DEXCOM G7 SENSOR) MISC 1 Device by Does not apply route continuous. 9 each 0   empagliflozin  (JARDIANCE ) 10 MG TABS tablet Take 1 tablet (10 mg total) by mouth daily before breakfast. 30 tablet 2   glucose blood (ONETOUCH VERIO) test strip Use as instructed twice a day.  E11.9 100 each 1   lisinopril -hydrochlorothiazide   (ZESTORETIC ) 10-12.5 MG tablet Take 1 tablet by mouth daily. 90 tablet 1   metFORMIN  (GLUCOPHAGE ) 1000 MG tablet Take 1 tablet (1,000 mg total) by mouth 2 (two) times daily with a meal. 180 tablet 1   montelukast  (SINGULAIR ) 10 MG tablet Take 1 tablet (10 mg total) by mouth at bedtime. 90 tablet 0   Semaglutide  (RYBELSUS ) 3 MG TABS Take 1 tablet (3 mg total) by mouth daily. 30 tablet 0   Semaglutide  (RYBELSUS ) 7 MG TABS Take 1 tablet (7 mg total) by mouth daily.     No current facility-administered medications for this visit.    No Known Allergies  Diagnoses:  F43.23  Plan of Care: Recommend ongoing therapy.   Pt participated  in setting treatment goals.   Plan to meet monthly.  Pt agrees with treatment plan.  Treatment Plan    Client Abilities/Strengths   Pt is bright, engaging, and motivated for therapy.  Client Treatment Preferences   Individual therapy.  Client Statement of Needs   Improve copings skills and have a safe place to talk about issues.   Symptoms   Depressed or irritable mood. Excessive and/or unrealistic worry that is difficult to control occurring more days than not for at least 6 months about a number of events or activities.  Problems Addressed   Unipolar Depression, Anxiety Goals  1. Alleviate depressive symptoms and return to previous level of effective functioning.  2. Appropriately grieve the loss in order to normalize mood and to return to previously adaptive level of functioning.  Objective  Learn and implement behavioral strategies to overcome depression. Target Date: 2024-08-17 Frequency: monthly  Progress: 55 Modality: individual  Related Interventions  Assist the client in developing skills that increase the likelihood of deriving pleasure from behavioral activation (e.g., assertiveness skills, developing an exercise plan, less internal/more external focus, increased social involvement); reinforce success. Engage the client in behavioral  activation, increasing his/her activity level and contact with sources of reward, while identifying processes that inhibit activation. use behavioral techniques such as instruction, rehearsal, role-playing, role reversal, as needed, to facilitate activity in the client's daily life; reinforce success. 3. Develop healthy interpersonal relationships that lead to the alleviation and help prevent the relapse of depression.  4. Develop healthy thinking patterns and beliefs about self, others, and the world that lead to the alleviation and help prevent the relapse of depression.  5. Enhance ability to effectively cope with the full variety of life's worries and anxieties.  6. Learn and implement coping skills that result in a reduction of anxiety and worry, and improved daily functioning.  Objective  Learn and implement problem-solving strategies for realistically addressing worries. Target Date: 2024-08-17 Frequency: monthly  Progress: 55 Modality: individual  Related Interventions  Assign the client a homework exercise in which he/she problem-solves a current problem (see Mastery of Your Anxiety and Worry: Workbook by Richarda and Jonne or Generalized Anxiety Disorder by Delores Filler, and Jonne); review, reinforce success, and provide corrective feedback toward improvement. Teach the client problem-solving strategies involving specifically defining a problem, generating options for addressing it, evaluating the pros and cons of each option, selecting and implementing an optional action, and reevaluating and refining the action. Objective  Learn and implement calming skills to reduce overall anxiety and manage anxiety symptoms. Target Date: 2024-08-17 Frequency: monthly  Progress: 55 Modality: individual  Related Interventions  Assign the client to read about progressive muscle relaxation and other calming strategies in relevant books or treatment manuals (e.g., Progressive Relaxation Training  by Thornell armin Collier; Mastery of Your Anxiety and Worry: Workbook by Richarda armin Jonne). Assign the client homework each session in which he/she practices relaxation exercises daily, gradually applying them progressively from non-anxiety-provoking to anxiety-provoking situations; review and reinforce success while providing corrective feedback toward improvement. Teach the client calming/relaxation skills (e.g., applied relaxation, progressive muscle relaxation, cue controlled relaxation; mindful breathing; biofeedback) and how to discriminate better between relaxation and tension; teach the client how to apply these skills to his/her daily life. 7. Recognize, accept, and cope with feelings of depression.  8. Reduce overall frequency, intensity, and duration of the anxiety so that daily functioning is not impaired.  9. Resolve the core conflict that is the source of anxiety.  10. Stabilize anxiety level while increasing ability to function on a daily basis.  Diagnosis    F43.23   Conditions For Discharge  Achievement of treatment goals and objectives    Veva Alma, LCSW

## 2023-09-15 ENCOUNTER — Ambulatory Visit: Payer: 59 | Admitting: Psychology

## 2023-09-15 DIAGNOSIS — F4323 Adjustment disorder with mixed anxiety and depressed mood: Secondary | ICD-10-CM

## 2023-09-15 NOTE — Progress Notes (Signed)
 [facility] Counselor/Therapist Progress Note  Patient ID: Jasmine Hinton, MRN: [id number],    Date: 09/15/2023  Time Spent: 12:00pm-12:50pm   50 minutes   Treatment Type: Individual Therapy  Reported Symptoms: stress  Mental Status Exam: Appearance:  Casual     Behavior: Appropriate  Motor: Normal  Speech/Language:  Normal Rate  Affect: Appropriate  Mood: normal  Thought process: normal  Thought content:   WNL  Sensory/Perceptual disturbances:   WNL  Orientation: oriented to person, place, time/date, and situation  Attention: Good  Concentration: Good  Memory: WNL  Fund of knowledge:  Good  Insight:   Good  Judgment:  Good  Impulse Control: Good   Risk Assessment: Danger to Self:  No Self-injurious Behavior: No Danger to Others: No Duty to Warn:no Physical Aggression / Violence:No  Access to Firearms a concern: No  Gang Involvement:No   Subjective: Pt present for face-to-face individual therapy via video.  Pt consents to telehealth video session and is aware of limitations and benefits of virtual sessions.  Location of pt: home Location of therapist: home office.   Pt talked about the state of the country.   Pt is upset by so many things that are happening.   Addressed how this is impacting pt.   She is feelings anger but is not feeling too anxious at this point.  Pt talked about her home life.  She states it is "pretty good".   However she feels like she and her husband Will are having a "physical disconnect".   Pt states they are not having sex very often and pt is disappointed with that.  Pt tends to be the initiator of most things and wishes her husband would initiate.   He does respond favorably when pt initiates.  Addressed the relationship dynamics.   Pt talked about being on medication that has helped her lose weight.   Pt feels good about her weight loss but is not liking some of the attention she is getting bc she is thinner.  Addressed how  people are treating pt differently and helped pt process her feelings.  Pt talked about her tendency to over think and catastrophize things.   Worked on thought reframing.  Worked on present moment mindfulness.   Provided supportive therapy.   Interventions: Cognitive Behavioral Therapy and Insight-Oriented  Diagnosis: F43.23  Plan of Care: Recommend ongoing therapy.   Pt participated in setting treatment goals.   Plan to meet monthly.  Pt agrees with treatment plan.  Treatment Plan    Client Abilities/Strengths   Pt is bright, engaging, and motivated for therapy.  Client Treatment Preferences   Individual therapy.  Client Statement of Needs   Improve copings skills and have a safe place to talk about issues.   Symptoms   Depressed or irritable mood. Excessive and/or unrealistic worry that is difficult to control occurring more days than not for at least 6 months about a number of events or activities.  Problems Addressed   Unipolar Depression, Anxiety Goals  1. Alleviate depressive symptoms and return to previous level of effective functioning.  2. Appropriately grieve the loss in order to normalize mood and to return to previously adaptive level of functioning.  Objective  Learn and implement behavioral strategies to overcome depression. Target Date: 2024-08-17 Frequency: monthly  Progress: 55 Modality: individual  Related Interventions  Assist the client in developing skills that increase the likelihood of deriving pleasure from behavioral activation (e.g., assertiveness skills, developing an exercise plan,  less internal/more external focus, increased social involvement); reinforce success. Engage the client in "behavioral activation," increasing his/her activity level and contact with sources of reward, while identifying processes that inhibit activation. use behavioral techniques such as instruction, rehearsal, role-playing, role reversal, as needed, to facilitate  activity in the client's daily life; reinforce success. 3. Develop healthy interpersonal relationships that lead to the alleviation and help prevent the relapse of depression.  4. Develop healthy thinking patterns and beliefs about self, others, and the world that lead to the alleviation and help prevent the relapse of depression.  5. Enhance ability to effectively cope with the full variety of life's worries and anxieties.  6. Learn and implement coping skills that result in a reduction of anxiety and worry, and improved daily functioning.  Objective  Learn and implement problem-solving strategies for realistically addressing worries. Target Date: 2024-08-17 Frequency: monthly  Progress: 55 Modality: individual  Related Interventions  Assign the client a homework exercise in which he/she problem-solves a current problem (see Mastery of Your Anxiety and Worry: Workbook by Elenora Fender and Filbert Schilder or Generalized Anxiety Disorder by Elesa Hacker, and Filbert Schilder); review, reinforce success, and provide corrective feedback toward improvement. Teach the client problem-solving strategies involving specifically defining a problem, generating options for addressing it, evaluating the pros and cons of each option, selecting and implementing an optional action, and reevaluating and refining the action. Objective  Learn and implement calming skills to reduce overall anxiety and manage anxiety symptoms. Target Date: 2024-08-17 Frequency: monthly  Progress: 55 Modality: individual  Related Interventions  Assign the client to read about progressive muscle relaxation and other calming strategies in relevant books or treatment manuals (e.g., Progressive Relaxation Training by Twana First; Mastery of Your Anxiety and Worry: Workbook by Earlie Counts). Assign the client homework each session in which he/she practices relaxation exercises daily, gradually applying them progressively from  non-anxiety-provoking to anxiety-provoking situations; review and reinforce success while providing corrective feedback toward improvement. Teach the client calming/relaxation skills (e.g., applied relaxation, progressive muscle relaxation, cue controlled relaxation; mindful breathing; biofeedback) and how to discriminate better between relaxation and tension; teach the client how to apply these skills to his/her daily life. 7. Recognize, accept, and cope with feelings of depression.  8. Reduce overall frequency, intensity, and duration of the anxiety so that daily functioning is not impaired.  9. Resolve the core conflict that is the source of anxiety.  10. Stabilize anxiety level while increasing ability to function on a daily basis.  Diagnosis    F43.23   Conditions For Discharge  Achievement of treatment goals and objectives   Salomon Fick, LCSW

## 2023-09-17 ENCOUNTER — Other Ambulatory Visit: Payer: Self-pay | Admitting: Family Medicine

## 2023-09-17 DIAGNOSIS — E785 Hyperlipidemia, unspecified: Secondary | ICD-10-CM

## 2023-10-09 ENCOUNTER — Other Ambulatory Visit: Payer: Self-pay | Admitting: Family Medicine

## 2023-10-09 DIAGNOSIS — I10 Essential (primary) hypertension: Secondary | ICD-10-CM

## 2023-10-13 ENCOUNTER — Ambulatory Visit: Payer: 59 | Admitting: Psychology

## 2023-10-13 ENCOUNTER — Ambulatory Visit: Admitting: Psychology

## 2023-11-11 ENCOUNTER — Other Ambulatory Visit: Payer: Self-pay | Admitting: Family Medicine

## 2023-11-11 DIAGNOSIS — J4520 Mild intermittent asthma, uncomplicated: Secondary | ICD-10-CM

## 2023-11-12 ENCOUNTER — Other Ambulatory Visit: Payer: Self-pay | Admitting: Family Medicine

## 2023-11-12 DIAGNOSIS — F418 Other specified anxiety disorders: Secondary | ICD-10-CM

## 2023-11-18 ENCOUNTER — Ambulatory Visit: Payer: 59 | Admitting: Psychology

## 2023-11-18 DIAGNOSIS — F4323 Adjustment disorder with mixed anxiety and depressed mood: Secondary | ICD-10-CM | POA: Diagnosis not present

## 2023-11-18 NOTE — Progress Notes (Signed)
  [facility] Counselor/Therapist Progress Note  Patient ID: KYNSLIE CALITRI, MRN: [id number],    Date: 11/18/2023  Time Spent: 12:00pm-12:50pm   50 minutes   Treatment Type: Individual Therapy  Reported Symptoms: stress  Mental Status Exam: Appearance:  Casual     Behavior: Appropriate  Motor: Normal  Speech/Language:  Normal Rate  Affect: Appropriate  Mood: normal  Thought process: normal  Thought content:   WNL  Sensory/Perceptual disturbances:   WNL  Orientation: oriented to person, place, time/date, and situation  Attention: Good  Concentration: Good  Memory: WNL  Fund of knowledge:  Good  Insight:   Good  Judgment:  Good  Impulse Control: Good   Risk Assessment: Danger to Self:  No Self-injurious Behavior: No Danger to Others: No Duty to Warn:no Physical Aggression / Violence:No  Access to Firearms a concern: No  Gang Involvement:No   Subjective: Pt present for face-to-face individual therapy via video.  Pt consents to telehealth video session and is aware of limitations and benefits of virtual sessions.  Location of pt: home Location of therapist: home office.   Pt talked about work.  It is a very busy day and somewhat stressful.   Worked on [employer], dispensing.  Pt talked about visiting her older sister and she and her husband had a vow renewal party.  It was a nice family time.   Pt talked about her relationship with her mother.   Her mother wants to join pt and her husband on outings and pt does not want her to.  Pt has been married for 3 years and wants to do things alone with her husband.  Pt does not understand why her mother expects to be invited to events that are meant for pt and husband.  Pt feels like her mother has a different set of expectations of her than she has of pt's sister.  Pt's mother shows preferential treatment toward pt's sister.   Addressed how this impacts pt.  Worked on how pt can communicate effectively with her mother and set  healthy boundaries.   Provided supportive therapy.   Interventions: Cognitive Behavioral Therapy and Insight-Oriented  Diagnosis: F43.23  Plan of Care: Recommend ongoing therapy.   Pt participated in setting treatment goals.   Plan to meet monthly.  Pt agrees with treatment plan.  Treatment Plan    Client Abilities/Strengths   Pt is bright, engaging, and motivated for therapy.  Client Treatment Preferences   Individual therapy.  Client Statement of Needs   Improve copings skills and have a safe place to talk about issues.   Symptoms   Depressed or irritable mood. Excessive and/or unrealistic worry that is difficult to control occurring more days than not for at least 6 months about a number of events or activities.  Problems Addressed   Unipolar Depression, Anxiety Goals  1. Alleviate depressive symptoms and return to previous level of effective functioning.  2. Appropriately grieve the loss in order to normalize mood and to return to previously adaptive level of functioning.  Objective  Learn and implement behavioral strategies to overcome depression. Target Date: 2024-08-17 Frequency: monthly  Progress: 55 Modality: individual  Related Interventions  Assist the client in developing skills that increase the likelihood of deriving pleasure from behavioral activation (e.g., assertiveness skills, developing an exercise plan, less internal/more external focus, increased social involvement); reinforce success. Engage the client in "behavioral activation," increasing his/her activity level and contact with sources of reward, while identifying processes that inhibit activation. use  behavioral techniques such as instruction, rehearsal, role-playing, role reversal, as needed, to facilitate activity in the client's daily life; reinforce success. 3. Develop healthy interpersonal relationships that lead to the alleviation and help prevent the relapse of depression.  4. Develop healthy  thinking patterns and beliefs about self, others, and the world that lead to the alleviation and help prevent the relapse of depression.  5. Enhance ability to effectively cope with the full variety of life's worries and anxieties.  6. Learn and implement coping skills that result in a reduction of anxiety and worry, and improved daily functioning.  Objective  Learn and implement problem-solving strategies for realistically addressing worries. Target Date: 2024-08-17 Frequency: monthly  Progress: 55 Modality: individual  Related Interventions  Assign the client a homework exercise in which he/she problem-solves a current problem (see Mastery of Your Anxiety and Worry: Workbook by Colbert Dates and Edna Gouty or Generalized Anxiety Disorder by Woodson He, and Edna Gouty); review, reinforce success, and provide corrective feedback toward improvement. Teach the client problem-solving strategies involving specifically defining a problem, generating options for addressing it, evaluating the pros and cons of each option, selecting and implementing an optional action, and reevaluating and refining the action. Objective  Learn and implement calming skills to reduce overall anxiety and manage anxiety symptoms. Target Date: 2024-08-17 Frequency: monthly  Progress: 55 Modality: individual  Related Interventions  Assign the client to read about progressive muscle relaxation and other calming strategies in relevant books or treatment manuals (e.g., Progressive Relaxation Training by Juleen Oakland; Mastery of Your Anxiety and Worry: Workbook by Rodney Clamp). Assign the client homework each session in which he/she practices relaxation exercises daily, gradually applying them progressively from non-anxiety-provoking to anxiety-provoking situations; review and reinforce success while providing corrective feedback toward improvement. Teach the client calming/relaxation skills (e.g., applied relaxation,  progressive muscle relaxation, cue controlled relaxation; mindful breathing; biofeedback) and how to discriminate better between relaxation and tension; teach the client how to apply these skills to his/her daily life. 7. Recognize, accept, and cope with feelings of depression.  8. Reduce overall frequency, intensity, and duration of the anxiety so that daily functioning is not impaired.  9. Resolve the core conflict that is the source of anxiety.  10. Stabilize anxiety level while increasing ability to function on a daily basis.  Diagnosis    F43.23   Conditions For Discharge  Achievement of treatment goals and objectives   Willey Harrier, LCSW

## 2023-12-10 ENCOUNTER — Other Ambulatory Visit: Payer: Self-pay | Admitting: Family Medicine

## 2023-12-10 DIAGNOSIS — E1165 Type 2 diabetes mellitus with hyperglycemia: Secondary | ICD-10-CM

## 2023-12-10 DIAGNOSIS — F418 Other specified anxiety disorders: Secondary | ICD-10-CM

## 2023-12-10 MED ORDER — RYBELSUS 7 MG PO TABS: 7.0000 mg | ORAL_TABLET | Freq: Every day | ORAL | 0 refills | Status: DC

## 2023-12-10 NOTE — Telephone Encounter (Unsigned)
 Copied from CRM [id number]. Topic: Clinical - Medication Refill >> Dec 10, 2023 12:53 PM Howard Macho wrote: Medication: Semaglutide  (RYBELSUS ) 7 MG TABS  Has the patient contacted their pharmacy? No (Agent: If no, request that the patient contact the pharmacy for the refill. If patient does not wish to contact the pharmacy document the reason why and proceed with request.) (Agent: If yes, when and what did the pharmacy advise?)  This is the patient's preferred pharmacy:  [facility] [address] [place] - [address] [place] [address] Phone: [phone] Fax: [phone]  Is this the correct pharmacy for this prescription? Yes If no, delete pharmacy and type the correct one.   Has the prescription been filled recently? Yes  Is the patient out of the medication? Yes  Has the patient been seen for an appointment in the last year OR does the patient have an upcoming appointment? Yes  Can we respond through MyChart? Yes  Agent: Please be advised that Rx refills may take up to 3 business days. We ask that you follow-up with your pharmacy.

## 2023-12-22 ENCOUNTER — Ambulatory Visit (INDEPENDENT_AMBULATORY_CARE_PROVIDER_SITE_OTHER): Admitting: Psychology

## 2023-12-22 DIAGNOSIS — F4323 Adjustment disorder with mixed anxiety and depressed mood: Secondary | ICD-10-CM

## 2023-12-22 NOTE — Progress Notes (Signed)
 [facility] Counselor/Therapist Progress Note  Patient ID: GIANAH BATT, MRN: [id number],    Date: 12/22/2023  Time Spent: 1:00pm-1:55pm   55 minutes   Treatment Type: Individual Therapy  Reported Symptoms: stress  Mental Status Exam: Appearance:  Casual     Behavior: Appropriate  Motor: Normal  Speech/Language:  Normal Rate  Affect: Appropriate  Mood: normal  Thought process: normal  Thought content:   WNL  Sensory/Perceptual disturbances:   WNL  Orientation: oriented to person, place, time/date, and situation  Attention: Good  Concentration: Good  Memory: WNL  Fund of knowledge:  Good  Insight:   Good  Judgment:  Good  Impulse Control: Good   Risk Assessment: Danger to Self:  No Self-injurious Behavior: No Danger to Others: No Duty to Warn:no Physical Aggression / Violence:No  Access to Firearms a concern: No  Gang Involvement:No   Subjective: Pt present for face-to-face individual therapy via video.  Pt consents to telehealth video session and is aware of limitations and benefits of virtual sessions.  Location of pt: home Location of therapist: home office.   Pt talked [place].  Pt is enraged about what is going on.    Pt talked about work.   They are understaffed which increases the stress.   Pt's position is affected bc staff are making more mistakes and pt has to help them out.  Addressed how the work dynamics impact pt.   Worked on [employer], dispensing.   Pt talked about her family.   Her 47 yo grandfather is out of the hospital and back at the nursing home.  Pt's mother has to feed him and he does not walk anymore.  It is hard for pt to see her grandfather decline.  Pt is experiencing anticipatory grief.   Helped pt process her feelings and grief.   Pt talked about her fear of spiders.  Addressed the fear and worked on calming strategies and thought reframing.   At times pt gets panic attacks.  Worked with pt on  identifying what triggers her panic attacks.  Things that are beyond her control can be triggers.  Encouraged pt to use her sensory grounding kit as a calming technique.    Provided supportive therapy.   Interventions: Cognitive Behavioral Therapy and Insight-Oriented  Diagnosis: F43.23  Plan of Care: Recommend ongoing therapy.   Pt participated in setting treatment goals.   Plan to meet monthly.  Pt agrees with treatment plan.  Treatment Plan    Client Abilities/Strengths   Pt is bright, engaging, and motivated for therapy.  Client Treatment Preferences   Individual therapy.  Client Statement of Needs   Improve copings skills and have a safe place to talk about issues.   Symptoms   Depressed or irritable mood. Excessive and/or unrealistic worry that is difficult to control occurring more days than not for at least 6 months about a number of events or activities.  Problems Addressed   Unipolar Depression, Anxiety Goals  1. Alleviate depressive symptoms and return to previous level of effective functioning.  2. Appropriately grieve the loss in order to normalize mood and to return to previously adaptive level of functioning.  Objective  Learn and implement behavioral strategies to overcome depression. Target Date: 2024-08-17 Frequency: monthly  Progress: 55 Modality: individual  Related Interventions  Assist the client in developing skills that increase the likelihood of deriving pleasure from behavioral activation (e.g., assertiveness skills, developing an exercise plan, less  internal/more external focus, increased social involvement); reinforce success. Engage the client in "behavioral activation," increasing his/her activity level and contact with sources of reward, while identifying processes that inhibit activation. use behavioral techniques such as instruction, rehearsal, role-playing, role reversal, as needed, to facilitate activity in the client's daily life; reinforce  success. 3. Develop healthy interpersonal relationships that lead to the alleviation and help prevent the relapse of depression.  4. Develop healthy thinking patterns and beliefs about self, others, and the world that lead to the alleviation and help prevent the relapse of depression.  5. Enhance ability to effectively cope with the full variety of life's worries and anxieties.  6. Learn and implement coping skills that result in a reduction of anxiety and worry, and improved daily functioning.  Objective  Learn and implement problem-solving strategies for realistically addressing worries. Target Date: 2024-08-17 Frequency: monthly  Progress: 55 Modality: individual  Related Interventions  Assign the client a homework exercise in which he/she problem-solves a current problem (see Mastery of Your Anxiety and Worry: Workbook by Colbert Dates and Edna Gouty or Generalized Anxiety Disorder by Woodson He, and Edna Gouty); review, reinforce success, and provide corrective feedback toward improvement. Teach the client problem-solving strategies involving specifically defining a problem, generating options for addressing it, evaluating the pros and cons of each option, selecting and implementing an optional action, and reevaluating and refining the action. Objective  Learn and implement calming skills to reduce overall anxiety and manage anxiety symptoms. Target Date: 2024-08-17 Frequency: monthly  Progress: 55 Modality: individual  Related Interventions  Assign the client to read about progressive muscle relaxation and other calming strategies in relevant books or treatment manuals (e.g., Progressive Relaxation Training by Juleen Oakland; Mastery of Your Anxiety and Worry: Workbook by Rodney Clamp). Assign the client homework each session in which he/she practices relaxation exercises daily, gradually applying them progressively from non-anxiety-provoking to anxiety-provoking situations; review  and reinforce success while providing corrective feedback toward improvement. Teach the client calming/relaxation skills (e.g., applied relaxation, progressive muscle relaxation, cue controlled relaxation; mindful breathing; biofeedback) and how to discriminate better between relaxation and tension; teach the client how to apply these skills to his/her daily life. 7. Recognize, accept, and cope with feelings of depression.  8. Reduce overall frequency, intensity, and duration of the anxiety so that daily functioning is not impaired.  9. Resolve the core conflict that is the source of anxiety.  10. Stabilize anxiety level while increasing ability to function on a daily basis.  Diagnosis    F43.23   Conditions For Discharge  Achievement of treatment goals and objectives   Willey Harrier, LCSW

## 2023-12-28 ENCOUNTER — Other Ambulatory Visit: Payer: Self-pay | Admitting: Family Medicine

## 2023-12-28 ENCOUNTER — Encounter: Payer: Self-pay | Admitting: Family Medicine

## 2023-12-28 ENCOUNTER — Ambulatory Visit (INDEPENDENT_AMBULATORY_CARE_PROVIDER_SITE_OTHER): Payer: Self-pay | Admitting: Family Medicine

## 2023-12-28 VITALS — BP 118/78 | HR 89 | Temp 98.3°F | Ht 65.0 in | Wt 203.0 lb

## 2023-12-28 DIAGNOSIS — Z23 Encounter for immunization: Secondary | ICD-10-CM | POA: Diagnosis not present

## 2023-12-28 DIAGNOSIS — F418 Other specified anxiety disorders: Secondary | ICD-10-CM

## 2023-12-28 DIAGNOSIS — E1165 Type 2 diabetes mellitus with hyperglycemia: Secondary | ICD-10-CM | POA: Diagnosis not present

## 2023-12-28 DIAGNOSIS — Z0000 Encounter for general adult medical examination without abnormal findings: Secondary | ICD-10-CM

## 2023-12-28 DIAGNOSIS — I10 Essential (primary) hypertension: Secondary | ICD-10-CM | POA: Diagnosis not present

## 2023-12-28 DIAGNOSIS — Z7984 Long term (current) use of oral hypoglycemic drugs: Secondary | ICD-10-CM

## 2023-12-28 DIAGNOSIS — E1169 Type 2 diabetes mellitus with other specified complication: Secondary | ICD-10-CM | POA: Diagnosis not present

## 2023-12-28 DIAGNOSIS — E785 Hyperlipidemia, unspecified: Secondary | ICD-10-CM

## 2023-12-28 DIAGNOSIS — J4520 Mild intermittent asthma, uncomplicated: Secondary | ICD-10-CM

## 2023-12-28 MED ORDER — RYBELSUS 7 MG PO TABS: 7.0000 mg | ORAL_TABLET | Freq: Every day | ORAL | 2 refills | Status: DC

## 2023-12-28 MED ORDER — TIRZEPATIDE-WEIGHT MANAGEMENT 2.5 MG/0.5ML ~~LOC~~ SOLN: 2.5000 mg | SUBCUTANEOUS | 0 refills | Status: DC

## 2023-12-28 NOTE — Patient Instructions (Addendum)
 Preventive Care 34-51 Years Old, Female Preventive care refers to lifestyle choices and visits with your health care provider that can promote health and wellness. Preventive care visits are also called wellness exams. What can I expect for my preventive care visit? Counseling Your health care provider may ask you questions about your: Medical history, including: Past medical problems. Family medical history. Pregnancy history. Current health, including: Menstrual cycle. Method of birth control. Emotional well-being. Home life and relationship well-being. Sexual activity and sexual health. Lifestyle, including: Alcohol, nicotine or tobacco, and drug use. Access to firearms. Diet, exercise, and sleep habits. Work and work Astronomer. Sunscreen use. Safety issues such as seatbelt and bike helmet use. Physical exam Your health care provider will check your: Height and weight. These may be used to calculate your BMI (body mass index). BMI is a measurement that tells if you are at a healthy weight. Waist circumference. This measures the distance around your waistline. This measurement also tells if you are at a healthy weight and may help predict your risk of certain diseases, such as type 2 diabetes and high blood pressure. Heart rate and blood pressure. Body temperature. Skin for abnormal spots. What immunizations do I need?  Vaccines are usually given at various ages, according to a schedule. Your health care provider will recommend vaccines for you based on your age, medical history, and lifestyle or other factors, such as travel or where you work. What tests do I need? Screening Your health care provider may recommend screening tests for certain conditions. This may include: Lipid and cholesterol levels. Diabetes screening. This is done by checking your blood sugar (glucose) after you have not eaten for a while (fasting). Pelvic exam and Pap test. Hepatitis B test. Hepatitis C  test. HIV (human immunodeficiency virus) test. STI (sexually transmitted infection) testing, if you are at risk. Lung cancer screening. Colorectal cancer screening. Mammogram. Talk with your health care provider about when you should start having regular mammograms. This may depend on whether you have a family history of breast cancer. BRCA-related cancer screening. This may be done if you have a family history of breast, ovarian, tubal, or peritoneal cancers. Bone density scan. This is done to screen for osteoporosis. Talk with your health care provider about your test results, treatment options, and if necessary, the need for more tests. Follow these instructions at home: Eating and drinking  Eat a diet that includes fresh fruits and vegetables, whole grains, lean protein, and low-fat dairy products. Take vitamin and mineral supplements as recommended by your health care provider. Do not drink alcohol if: Your health care provider tells you not to drink. You are pregnant, may be pregnant, or are planning to become pregnant. If you drink alcohol: Limit how much you have to 0-1 drink a day. Know how much alcohol is in your drink. In the [place] one drink equals one 12 oz bottle of beer (355 mL), one 5 oz glass of wine (148 mL), or one 1 oz glass of hard liquor (44 mL). Lifestyle Brush your teeth every morning and night with fluoride toothpaste. Floss one time each day. Exercise for at least 30 minutes 5 or more days each week. Do not use any products that contain nicotine or tobacco. These products include cigarettes, chewing tobacco, and vaping devices, such as e-cigarettes. If you need help quitting, ask your health care provider. Do not use drugs. If you are sexually active, practice safe sex. Use a condom or other form of protection to  prevent STIs. If you do not wish to become pregnant, use a form of birth control. If you plan to become pregnant, see your health care provider for a  prepregnancy visit. Take aspirin only as told by your health care provider. Make sure that you understand how much to take and what form to take. Work with your health care provider to find out whether it is safe and beneficial for you to take aspirin daily. Find healthy ways to manage stress, such as: Meditation, yoga, or listening to music. Journaling. Talking to a trusted person. Spending time with friends and family. Minimize exposure to UV radiation to reduce your risk of skin cancer. Safety Always wear your seat belt while driving or riding in a vehicle. Do not drive: If you have been drinking alcohol. Do not ride with someone who has been drinking. When you are tired or distracted. While texting. If you have been using any mind-altering substances or drugs. Wear a helmet and other protective equipment during sports activities. If you have firearms in your house, make sure you follow all gun safety procedures. Seek help if you have been physically or sexually abused. What's next? Visit your health care provider once a year for an annual wellness visit. Ask your health care provider how often you should have your eyes and teeth checked. Stay up to date on all vaccines. This information is not intended to replace advice given to you by your health care provider. Make sure you discuss any questions you have with your health care provider. Document Revised: 12/26/2020 Document Reviewed: 12/26/2020 Elsevier Patient Education  2024 [employer]Preventive Care 34-51 Years Old, Female Preventive care refers to lifestyle choices and visits with your health care provider that can promote health and wellness. Preventive care visits are also called wellness exams. What can I expect for my preventive care visit? Counseling Your health care provider may ask you questions about your: Medical history, including: Past medical problems. Family medical history. Pregnancy history. Current  health, including: Menstrual cycle. Method of birth control. Emotional well-being. Home life and relationship well-being. Sexual activity and sexual health. Lifestyle, including: Alcohol, nicotine or tobacco, and drug use. Access to firearms. Diet, exercise, and sleep habits. Work and work Astronomer. Sunscreen use. Safety issues such as seatbelt and bike helmet use. Physical exam Your health care provider will check your: Height and weight. These may be used to calculate your BMI (body mass index). BMI is a measurement that tells if you are at a healthy weight. Waist circumference. This measures the distance around your waistline. This measurement also tells if you are at a healthy weight and may help predict your risk of certain diseases, such as type 2 diabetes and high blood pressure. Heart rate and blood pressure. Body temperature. Skin for abnormal spots. What immunizations do I need?  Vaccines are usually given at various ages, according to a schedule. Your health care provider will recommend vaccines for you based on your age, medical history, and lifestyle or other factors, such as travel or where you work. What tests do I need? Screening Your health care provider may recommend screening tests for certain conditions. This may include: Lipid and cholesterol levels. Diabetes screening. This is done by checking your blood sugar (glucose) after you have not eaten for a while (fasting). Pelvic exam and Pap test. Hepatitis B test. Hepatitis C test. HIV (human immunodeficiency virus) test. STI (sexually transmitted infection) testing, if you are at risk. Lung cancer screening. Colorectal cancer screening.  Mammogram. Talk with your health care provider about when you should start having regular mammograms. This may depend on whether you have a family history of breast cancer. BRCA-related cancer screening. This may be done if you have a family history of breast, ovarian, tubal,  or peritoneal cancers. Bone density scan. This is done to screen for osteoporosis. Talk with your health care provider about your test results, treatment options, and if necessary, the need for more tests. Follow these instructions at home: Eating and drinking  Eat a diet that includes fresh fruits and vegetables, whole grains, lean protein, and low-fat dairy products. Take vitamin and mineral supplements as recommended by your health care provider. Do not drink alcohol if: Your health care provider tells you not to drink. You are pregnant, may be pregnant, or are planning to become pregnant. If you drink alcohol: Limit how much you have to 0-1 drink a day. Know how much alcohol is in your drink. In the [place] one drink equals one 12 oz bottle of beer (355 mL), one 5 oz glass of wine (148 mL), or one 1 oz glass of hard liquor (44 mL). Lifestyle Brush your teeth every morning and night with fluoride toothpaste. Floss one time each day. Exercise for at least 30 minutes 5 or more days each week. Do not use any products that contain nicotine or tobacco. These products include cigarettes, chewing tobacco, and vaping devices, such as e-cigarettes. If you need help quitting, ask your health care provider. Do not use drugs. If you are sexually active, practice safe sex. Use a condom or other form of protection to prevent STIs. If you do not wish to become pregnant, use a form of birth control. If you plan to become pregnant, see your health care provider for a prepregnancy visit. Take aspirin only as told by your health care provider. Make sure that you understand how much to take and what form to take. Work with your health care provider to find out whether it is safe and beneficial for you to take aspirin daily. Find healthy ways to manage stress, such as: Meditation, yoga, or listening to music. Journaling. Talking to a trusted person. Spending time with friends and family. Minimize exposure to  UV radiation to reduce your risk of skin cancer. Safety Always wear your seat belt while driving or riding in a vehicle. Do not drive: If you have been drinking alcohol. Do not ride with someone who has been drinking. When you are tired or distracted. While texting. If you have been using any mind-altering substances or drugs. Wear a helmet and other protective equipment during sports activities. If you have firearms in your house, make sure you follow all gun safety procedures. Seek help if you have been physically or sexually abused. What's next? Visit your health care provider once a year for an annual wellness visit. Ask your health care provider how often you should have your eyes and teeth checked. Stay up to date on all vaccines. This information is not intended to replace advice given to you by your health care provider. Make sure you discuss any questions you have with your health care provider. Document Revised: 12/26/2020 Document Reviewed: 12/26/2020 Elsevier Patient Education  2024 [employer].

## 2023-12-28 NOTE — Progress Notes (Signed)
 Established Patient Office Visit  Subjective   Patient ID: Jasmine Hinton, female    DOB: [birth year]  Age: 53 y.o. MRN: [id number]  Chief Complaint  Patient presents with   Annual Exam    HPI Discussed the use of AI scribe software for clinical note transcription with the patient, who gave verbal consent to proceed.  History of Present Illness Jasmine Hinton is a 53 year old female with diabetes who presents with concerns about medication costs and efficacy.  She has experienced significant weight loss, which she attributes to her current diabetes medication regimen. The medication makes her feel full, reducing her food intake. She feels much better overall and is less worried about her diet and social eating situations.  She is currently taking metformin  and Rybelsus , which she finds effective but expensive. She is frustrated with her insurance coverage, which requires her to pay a substantial out-of-pocket cost for her medication. She has explored manufacturer coupons to reduce costs.  She previously saw Dr. Vertell Gory but no longer does, as she recalls being told her condition was not severe enough because of her weight loss. She is considering switching to a different medication that might be more affordable.  Her grandfather on her mother's side recently developed dementia following a urinary tract infection. He is 51 years old and has experienced a decline in health since the infection.  She has not visited a gynecologist in two years and has never had an abnormal Pap smear. She last saw an eye doctor in December and has been keeping up with her health maintenance appointments.   Patient Active Problem List   Diagnosis Date Noted   Concussion with no loss of consciousness 10/21/2016   Carpal tunnel syndrome 10/18/2015   Right knee pain 10/22/2012   Hyperlipidemia LDL goal <70 10/22/2012   Acute upper respiratory infection 08/21/2010   VAGINITIS, CANDIDAL 01/29/2010    Sinusitis, chronic 08/23/2009   KELOID 05/10/2009   Acute sinusitis 02/08/2009   FIBROIDS, UTERUS 11/24/2008   Vaginitis and vulvovaginitis 09/18/2008   FATIGUE 09/08/2008   CARDIAC MURMUR 09/08/2008   Anxiety state 11/03/2007   BACK PAIN, THORACIC REGION 05/29/2007   BACK STRAIN, LUMBAR 05/29/2007   Diabetes mellitus, type II (HCC) 01/08/2007   OBESITY NOS 01/08/2007   SCARLET FEVER 11/03/2006   DEPRESSION 11/03/2006   Essential hypertension 11/03/2006   Pregnancy with abortive outcome 02/11/2005   Past Medical History:  Diagnosis Date   Anxiety    Asthma    Depression    Diabetes mellitus    Hypertension    Past Surgical History:  Procedure Laterality Date   TONSILLECTOMY     Social History   Tobacco Use   Smoking status: Never   Smokeless tobacco: Never  Vaping Use   Vaping status: Never Used  Substance Use Topics   Alcohol use: Yes    Comment: rare   Drug use: No   Social History   Socioeconomic History   Marital status: Married    Spouse name: william   Number of children: Not on file   Years of education: Not on file   Highest education level: Bachelor's degree (e.g., BA, AB, BS)  Occupational History   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    Comment: one partner currently  Other Topics Concern   Not on file  Social History Narrative   Not on file   Social Drivers of Health   Financial Resource Strain: Not on file  Food Insecurity: No Food Insecurity (10/27/2022)   Hunger Vital Sign    Worried About Running Out of Food in the Last Year: Never true    Ran Out of Food in the Last Year: Never true  Transportation Needs: No Transportation Needs (10/27/2022)   PRAPARE -  Administrator, Civil Service (Medical): No    Lack of Transportation (Non-Medical): No  Physical Activity: Insufficiently Active (10/27/2022)   Exercise Vital Sign    Days of Exercise per Week: 1 day    Minutes of Exercise per Session: 30 min  Stress: No Stress Concern Present (10/27/2022)   [employer] of Occupational Health - Occupational Stress Questionnaire    Feeling of Stress : Only a little  Social Connections: Unknown (10/27/2022)   Social Connection and Isolation Panel    Frequency of Communication with Friends and Family: More than three times a week    Frequency of Social Gatherings with Friends and Family: Three times a week    Attends Religious Services: Patient declined    Active Member of Clubs or Organizations: Patient declined    Attends Engineer, structural: Not on file    Marital Status: Married  Catering manager Violence: Not on file   Family Status  Relation Name Status   Mother  Alive   Father  Alive   [employer]  Alive   Other  (Not Specified)   Other  (Not Specified)   Other  (Not Specified)   Neg Hx  (Not Specified)  No partnership data on file   Family History  Problem Relation Age of Onset   Hypertension Mother    Thyroid  disease Mother    Diabetes Mother    Diabetes Father    COPD Father    Colon polyps Maternal Grandfather    Colon cancer Maternal Grandfather 40   Dementia Maternal Grandfather    Diabetes Other    Hypertension Other    Depression Other    Esophageal cancer Neg Hx    Rectal cancer Neg Hx    Stomach cancer Neg Hx    No Known Allergies    Review of Systems  Constitutional:  Negative for fever and malaise/fatigue.  HENT:  Negative for congestion.   Eyes:  Negative for blurred vision.  Respiratory:  Negative for cough and shortness of breath.   Cardiovascular:  Negative for chest pain, palpitations and leg swelling.  Gastrointestinal:  Negative for vomiting.  Musculoskeletal:  Negative for back pain.   Skin:  Negative for rash.  Neurological:  Negative for loss of consciousness and headaches.      Objective:     BP 118/78   Pulse 89   Temp 98.3 F (36.8 C)   Ht 5' 5 (1.651 m)   Wt 203 lb (92.1 kg)   LMP 12/13/2023   SpO2 99%   BMI 33.78 kg/m  BP Readings from Last 3 Encounters:  12/28/23 118/78  04/23/23 120/70  11/25/22 120/80   Wt Readings from Last 3 Encounters:  12/28/23 203 lb (92.1 kg)  04/23/23 206 lb 3.2 oz (93.5 kg)  11/25/22 207 lb 6.4 oz (94.1 kg)   SpO2 Readings from Last 3 Encounters:  12/28/23 99%  04/23/23 98%  10/28/22 99%      Physical Exam Vitals and nursing note reviewed.  Constitutional:      General: She is not in acute distress.    Appearance: Normal appearance. She is well-developed.  HENT:     Head: Normocephalic and atraumatic.     Right Ear: Tympanic membrane, ear canal and external ear normal. There is no impacted cerumen.     Left Ear: Tympanic membrane, ear canal and external ear normal. There is no impacted cerumen.     Nose: Nose normal.     Mouth/Throat:     Mouth: Mucous membranes are moist.     Pharynx: Oropharynx is clear. No oropharyngeal exudate or posterior oropharyngeal erythema.   Eyes:     General: No scleral icterus.       Right eye: No discharge.        Left eye: No discharge.     Conjunctiva/sclera: Conjunctivae normal.     Pupils: Pupils are equal, round, and reactive to light.   Neck:     Thyroid : No thyromegaly or thyroid  tenderness.     Vascular: No JVD.   Cardiovascular:     Rate and Rhythm: Normal rate and regular rhythm.     Heart sounds: Normal heart sounds. No murmur heard. Pulmonary:     Effort: Pulmonary effort is normal. No respiratory distress.     Breath sounds: Normal breath sounds.  Abdominal:     General: Bowel sounds are normal. There is no distension.     Palpations: Abdomen is soft. There is no mass.     Tenderness: There is no abdominal tenderness. There is no guarding or  rebound.   Musculoskeletal:        General: Normal range of motion.     Cervical back: Normal range of motion and neck supple.     Right lower leg: No edema.     Left lower leg: No edema.  Lymphadenopathy:     Cervical: No cervical adenopathy.   Skin:    General: Skin is warm and dry.     Findings: No erythema or rash.   Neurological:     Mental Status: She is alert and oriented to person, place, and time.     Cranial Nerves: No cranial nerve deficit.     Deep Tendon Reflexes: Reflexes are normal and symmetric.   Psychiatric:        Mood and Affect: Mood normal.        Behavior: Behavior normal.        Thought Content: Thought content normal.        Judgment: Judgment normal.      No results found for any visits on 12/28/23.  Last CBC Lab Results  Component Value Date   WBC 4.4 10/28/2022   HGB 13.0 10/28/2022   HCT 39.0 10/28/2022   MCV 87.8 10/28/2022   RDW 14.1 10/28/2022   PLT 287.0 10/28/2022   Last metabolic panel Lab Results  Component Value Date   GLUCOSE 99 07/29/2023   NA 139 07/29/2023   K 4.2 07/29/2023   CL 102 07/29/2023   CO2 28 07/29/2023   BUN 9 07/29/2023   CREATININE 0.64 07/29/2023   GFR 103.29 07/29/2023   CALCIUM  9.5 07/29/2023   PROT 6.9 07/29/2023   ALBUMIN 4.4 07/29/2023   BILITOT 0.5 07/29/2023   ALKPHOS 70 07/29/2023   AST 12 07/29/2023   ALT 18 07/29/2023   Last lipids Lab  Results  Component Value Date   CHOL 132 07/29/2023   HDL 41.40 07/29/2023   LDLCALC 80 07/29/2023   TRIG 51.0 07/29/2023   CHOLHDL 3 07/29/2023   Last hemoglobin A1c Lab Results  Component Value Date   HGBA1C 6.7 (H) 07/29/2023   Last thyroid  functions Lab Results  Component Value Date   TSH 1.43 10/28/2022   Last vitamin D No results found for: 25OHVITD2, 25OHVITD3, VD25OH Last vitamin B12 and Folate Lab Results  Component Value Date   VITAMINB12 317 09/08/2008   FOLATE 12.5 09/08/2008      The 10-year ASCVD risk score  (Arnett DK, et al., 2019) is: 5.9%    Assessment & Plan:   Problem List Items Addressed This Visit       Unprioritized   Essential hypertension   Relevant Orders   CBC with Differential/Platelet   Comprehensive metabolic panel with GFR   Lipid panel   TSH   Diabetes mellitus, type II (HCC)   Relevant Medications   tirzepatide (ZEPBOUND) 2.5 MG/0.5ML injection vial   Other Relevant Orders   Comprehensive metabolic panel with GFR   Hemoglobin A1c   Other Visit Diagnoses       Preventative health care    -  Primary   Relevant Orders   CBC with Differential/Platelet   Comprehensive metabolic panel with GFR   Lipid panel   TSH     Need for shingles vaccine       Relevant Orders   Varicella-zoster vaccine IM (Completed)     Hyperlipidemia associated with type 2 diabetes mellitus (HCC)       Relevant Orders   CBC with Differential/Platelet   Comprehensive metabolic panel with GFR   Lipid panel     Mild intermittent asthma without complication         Depression with anxiety         Assessment and Plan Assessment & Plan Type 2 Diabetes Mellitus   She has experienced significant weight loss and improved quality of life with Rybelsus  and metformin . Due to financial challenges and inadequate insurance coverage, switching to Zepbound, an injectable GLP-1 receptor agonist, is considered. Zepbound is more affordable with a manufacturer's coupon, offers convenient once-weekly administration, and may continue to aid in weight loss and glycemic control. Switch to Zepbound, coordinate with [facility] for direct supply at a reduced cost, and discontinue Rybelsus .  General Health Maintenance   Her eye examination is current as of December. She is overdue for a gynecological visit, including a Pap smear and mammogram, with no history of abnormal Pap smears. Schedule a Pap smear and mammogram and continue annual eye examinations.  Family History of Dementia   Her maternal grandfather, aged  75, developed dementia following a urinary tract infection, marking a change in family history. Document family history of dementia for future reference.    Return in about 3 months (around 03/29/2024), or if symptoms worsen or fail to improve.    Ora Mcnatt R Lowne Chase, DO

## 2023-12-29 ENCOUNTER — Other Ambulatory Visit (HOSPITAL_COMMUNITY): Payer: Self-pay

## 2023-12-29 ENCOUNTER — Telehealth: Payer: Self-pay

## 2023-12-29 MED ORDER — CITALOPRAM HYDROBROMIDE 20 MG PO TABS: 20.0000 mg | ORAL_TABLET | Freq: Every day | ORAL | 1 refills | Status: DC

## 2023-12-29 NOTE — Telephone Encounter (Signed)
 Pharmacy Patient Advocate Encounter  Received notification from [facility] that Prior Authorization for Rybelsus  7 has been APPROVED from 12/29/23 to 12/28/24. Ran test claim, Copay is $952.14 due to patient deductible. This test claim was processed through [facility]- copay amounts may vary at other pharmacies due to pharmacy/plan contracts, or as the patient moves through the different stages of their insurance plan.   PA #/Case ID/Reference #: [id number]

## 2023-12-29 NOTE — Telephone Encounter (Signed)
 Pharmacy Patient Advocate Encounter   Received notification from CoverMyMeds that prior authorization for Rybelsus  7 is required/requested.   Insurance verification completed.   The patient is insured through [employer] .   Per test claim: PA required; PA submitted to above mentioned insurance via CoverMyMeds Key/confirmation #/EOC [id number] Status is pending  Patient note says Rybelsus  is discontinued and trying for Zepbound. Zepbound is for weight loss, Mounjaro and Ozempic  are for diabetes. The only PA request was for Rybelsus . Please advise

## 2024-01-01 ENCOUNTER — Telehealth: Payer: Self-pay

## 2024-01-01 DIAGNOSIS — E1165 Type 2 diabetes mellitus with hyperglycemia: Secondary | ICD-10-CM

## 2024-01-01 MED ORDER — TIRZEPATIDE-WEIGHT MANAGEMENT 2.5 MG/0.5ML ~~LOC~~ SOLN: 2.5000 mg | SUBCUTANEOUS | 0 refills | Status: DC

## 2024-01-01 NOTE — Telephone Encounter (Signed)
 Copied from CRM [id number]. Topic: Clinical - Prescription Issue >> Jan 01, 2024  8:23 AM Jenice Mitts wrote: Reason for CRM: Patient is calling because [facility] told her the prescription for tirzepatide (ZEPBOUND) 2.5 MG/0.5ML injection vial  Needs to resent with the dosage properly written

## 2024-01-01 NOTE — Telephone Encounter (Signed)
Corrected prescription has been sent

## 2024-01-10 ENCOUNTER — Other Ambulatory Visit: Payer: Self-pay | Admitting: Family Medicine

## 2024-01-10 DIAGNOSIS — E1165 Type 2 diabetes mellitus with hyperglycemia: Secondary | ICD-10-CM

## 2024-01-10 DIAGNOSIS — I10 Essential (primary) hypertension: Secondary | ICD-10-CM

## 2024-01-12 ENCOUNTER — Other Ambulatory Visit: Payer: Self-pay | Admitting: Family Medicine

## 2024-01-12 DIAGNOSIS — E1169 Type 2 diabetes mellitus with other specified complication: Secondary | ICD-10-CM

## 2024-01-19 ENCOUNTER — Other Ambulatory Visit: Payer: Self-pay | Admitting: Family Medicine

## 2024-01-19 ENCOUNTER — Ambulatory Visit (INDEPENDENT_AMBULATORY_CARE_PROVIDER_SITE_OTHER): Admitting: Psychology

## 2024-01-19 DIAGNOSIS — F4323 Adjustment disorder with mixed anxiety and depressed mood: Secondary | ICD-10-CM

## 2024-01-19 DIAGNOSIS — E1165 Type 2 diabetes mellitus with hyperglycemia: Secondary | ICD-10-CM

## 2024-01-19 NOTE — Progress Notes (Signed)
 [facility] Counselor/Therapist Progress Note  Patient ID: KENYONA RENA, MRN: [id number],    Date: 01/19/2024  Time Spent: 12:00pm-12:50pm   50 minutes   Treatment Type: Individual Therapy  Reported Symptoms: stress  Mental Status Exam: Appearance:  Casual     Behavior: Appropriate  Motor: Normal  Speech/Language:  Normal Rate  Affect: Appropriate  Mood: normal  Thought process: normal  Thought content:   WNL  Sensory/Perceptual disturbances:   WNL  Orientation: oriented to person, place, time/date, and situation  Attention: Good  Concentration: Good  Memory: WNL  Fund of knowledge:  Good  Insight:   Good  Judgment:  Good  Impulse Control: Good   Risk Assessment: Danger to Self:  No Self-injurious Behavior: No Danger to Others: No Duty to Warn:no Physical Aggression / Violence:No  Access to Firearms a concern: No  Gang Involvement:No   Subjective: Pt present for face-to-face individual therapy via video.  Pt consents to telehealth video session and is aware of limitations and benefits of virtual sessions.  Location of pt: home Location of therapist: home office.     Pt talked about work.   Pt is off of work this week bc she is on bereavement.   Her 66 yo grandfather passed away last week and pt had the funeral on Saturday.   Helped pt process her feelings and grief.  Pt has also been worried about her mother and grandmother.  Pt feels like she has been taking care of her family's grief so much that she has not been able to fully grieve herself.   Addressed the family dynamics and how they impact pt.   Worked on self care strategies.   Provided supportive therapy.   Interventions: Cognitive Behavioral Therapy and Insight-Oriented  Diagnosis: F43.23  Plan of Care: Recommend ongoing therapy.   Pt participated in setting treatment goals.   Plan to meet monthly.  Pt agrees with treatment plan.  Treatment Plan    Client Abilities/Strengths   Pt is  bright, engaging, and motivated for therapy.  Client Treatment Preferences   Individual therapy.  Client Statement of Needs   Improve copings skills and have a safe place to talk about issues.   Symptoms   Depressed or irritable mood. Excessive and/or unrealistic worry that is difficult to control occurring more days than not for at least 6 months about a number of events or activities.  Problems Addressed   Unipolar Depression, Anxiety Goals  1. Alleviate depressive symptoms and return to previous level of effective functioning.  2. Appropriately grieve the loss in order to normalize mood and to return to previously adaptive level of functioning.  Objective  Learn and implement behavioral strategies to overcome depression. Target Date: 2024-08-17 Frequency: monthly  Progress: 55 Modality: individual  Related Interventions  Assist the client in developing skills that increase the likelihood of deriving pleasure from behavioral activation (e.g., assertiveness skills, developing an exercise plan, less internal/more external focus, increased social involvement); reinforce success. Engage the client in behavioral activation, increasing his/her activity level and contact with sources of reward, while identifying processes that inhibit activation. use behavioral techniques such as instruction, rehearsal, role-playing, role reversal, as needed, to facilitate activity in the client's daily life; reinforce success. 3. Develop healthy interpersonal relationships that lead to the alleviation and help prevent the relapse of depression.  4. Develop healthy thinking patterns and beliefs about self, others, and the world that lead to the alleviation and help prevent the relapse of depression.  5. Enhance ability to effectively cope with the full variety of life's worries and anxieties.  6. Learn and implement coping skills that result in a reduction of anxiety and worry, and improved daily  functioning.  Objective  Learn and implement problem-solving strategies for realistically addressing worries. Target Date: 2024-08-17 Frequency: monthly  Progress: 55 Modality: individual  Related Interventions  Assign the client a homework exercise in which he/she problem-solves a current problem (see Mastery of Your Anxiety and Worry: Workbook by Richarda and Jonne or Generalized Anxiety Disorder by Delores Filler, and Jonne); review, reinforce success, and provide corrective feedback toward improvement. Teach the client problem-solving strategies involving specifically defining a problem, generating options for addressing it, evaluating the pros and cons of each option, selecting and implementing an optional action, and reevaluating and refining the action. Objective  Learn and implement calming skills to reduce overall anxiety and manage anxiety symptoms. Target Date: 2024-08-17 Frequency: monthly  Progress: 55 Modality: individual  Related Interventions  Assign the client to read about progressive muscle relaxation and other calming strategies in relevant books or treatment manuals (e.g., Progressive Relaxation Training by Thornell armin Collier; Mastery of Your Anxiety and Worry: Workbook by Richarda armin Jonne). Assign the client homework each session in which he/she practices relaxation exercises daily, gradually applying them progressively from non-anxiety-provoking to anxiety-provoking situations; review and reinforce success while providing corrective feedback toward improvement. Teach the client calming/relaxation skills (e.g., applied relaxation, progressive muscle relaxation, cue controlled relaxation; mindful breathing; biofeedback) and how to discriminate better between relaxation and tension; teach the client how to apply these skills to his/her daily life. 7. Recognize, accept, and cope with feelings of depression.  8. Reduce overall frequency, intensity, and duration of the  anxiety so that daily functioning is not impaired.  9. Resolve the core conflict that is the source of anxiety.  10. Stabilize anxiety level while increasing ability to function on a daily basis.  Diagnosis    F43.23   Conditions For Discharge  Achievement of treatment goals and objectives   Veva Alma, LCSW

## 2024-01-22 ENCOUNTER — Telehealth: Payer: Self-pay

## 2024-01-22 NOTE — Telephone Encounter (Signed)
 Looks like Pt didn't do labs on 12/28/23

## 2024-01-22 NOTE — Telephone Encounter (Signed)
Pt scheduled for lab next week.

## 2024-01-22 NOTE — Telephone Encounter (Signed)
 Copied from CRM [id number]. Topic: Clinical - Medication Question >> Jan 22, 2024 11:55 AM Chiquita SQUIBB wrote: Reason for CRM: Patient is calling in to see if Dr. Antonio would like to order the next dose up for the ZEPBOUND  2.5 MG/0.5ML injection vial [[id number]]. Please advise the patient

## 2024-01-25 ENCOUNTER — Ambulatory Visit: Payer: Self-pay | Admitting: Family

## 2024-01-27 ENCOUNTER — Other Ambulatory Visit: Payer: Self-pay

## 2024-02-02 ENCOUNTER — Telehealth: Payer: Self-pay | Admitting: Family Medicine

## 2024-02-02 NOTE — Addendum Note (Signed)
 Addended by: ELOUISE POWELL HERO on: 02/02/2024 08:34 AM   Modules accepted: Orders

## 2024-02-02 NOTE — Telephone Encounter (Signed)
 Good morning this pt has labs that need  to be released for an appointment can you make them future please

## 2024-02-08 ENCOUNTER — Other Ambulatory Visit: Payer: Self-pay | Admitting: Family Medicine

## 2024-02-08 DIAGNOSIS — J4520 Mild intermittent asthma, uncomplicated: Secondary | ICD-10-CM

## 2024-02-10 ENCOUNTER — Other Ambulatory Visit (INDEPENDENT_AMBULATORY_CARE_PROVIDER_SITE_OTHER): Payer: Self-pay

## 2024-02-10 DIAGNOSIS — E785 Hyperlipidemia, unspecified: Secondary | ICD-10-CM

## 2024-02-10 DIAGNOSIS — I10 Essential (primary) hypertension: Secondary | ICD-10-CM

## 2024-02-10 DIAGNOSIS — Z0000 Encounter for general adult medical examination without abnormal findings: Secondary | ICD-10-CM

## 2024-02-10 DIAGNOSIS — E1165 Type 2 diabetes mellitus with hyperglycemia: Secondary | ICD-10-CM

## 2024-02-10 DIAGNOSIS — E1169 Type 2 diabetes mellitus with other specified complication: Secondary | ICD-10-CM | POA: Diagnosis not present

## 2024-02-10 LAB — COMPREHENSIVE METABOLIC PANEL WITH GFR
ALT: 14 U/L (ref 0–35)
AST: 10 U/L (ref 0–37)
Albumin: 4.2 g/dL (ref 3.5–5.2)
Alkaline Phosphatase: 60 U/L (ref 39–117)
BUN: 5 mg/dL — ABNORMAL LOW (ref 6–23)
CO2: 31 meq/L (ref 19–32)
Calcium: 9.2 mg/dL (ref 8.4–10.5)
Chloride: 101 meq/L (ref 96–112)
Creatinine, Ser: 0.52 mg/dL (ref 0.40–1.20)
GFR: 108.18 mL/min (ref >60.00)
Glucose, Bld: 93 mg/dL (ref 70–99)
Potassium: 3.9 meq/L (ref 3.5–5.1)
Sodium: 138 meq/L (ref 135–145)
Total Bilirubin: 0.5 mg/dL (ref 0.2–1.2)
Total Protein: 6.5 g/dL (ref 6.0–8.3)

## 2024-02-10 LAB — LIPID PANEL
Cholesterol: 133 mg/dL (ref 0–200)
HDL: 53.1 mg/dL (ref >39.00)
LDL Cholesterol: 69 mg/dL (ref 0–99)
NonHDL: 79.96
Total CHOL/HDL Ratio: 3
Triglycerides: 54 mg/dL (ref 0.0–149.0)
VLDL: 10.8 mg/dL (ref 0.0–40.0)

## 2024-02-10 LAB — CBC WITH DIFFERENTIAL/PLATELET
Basophils Absolute: 0 K/uL (ref 0.0–0.1)
Basophils Relative: 1.1 % (ref 0.0–3.0)
Eosinophils Absolute: 0.1 K/uL (ref 0.0–0.7)
Eosinophils Relative: 2.6 % (ref 0.0–5.0)
HCT: 39.6 % (ref 36.0–46.0)
Hemoglobin: 13 g/dL (ref 12.0–15.0)
Lymphocytes Relative: 51.9 % — ABNORMAL HIGH (ref 12.0–46.0)
Lymphs Abs: 2.1 K/uL (ref 0.7–4.0)
MCHC: 32.8 g/dL (ref 30.0–36.0)
MCV: 87.9 fl (ref 78.0–100.0)
Monocytes Absolute: 0.4 K/uL (ref 0.1–1.0)
Monocytes Relative: 8.9 % (ref 3.0–12.0)
Neutro Abs: 1.4 K/uL (ref 1.4–7.7)
Neutrophils Relative %: 35.5 % — ABNORMAL LOW (ref 43.0–77.0)
Platelets: 308 K/uL (ref 150.0–400.0)
RBC: 4.5 Mil/uL (ref 3.87–5.11)
RDW: 14.6 % (ref 11.5–15.5)
WBC: 4.1 K/uL (ref 4.0–10.5)

## 2024-02-10 LAB — TSH: TSH: 2.54 u[IU]/mL (ref 0.35–5.50)

## 2024-02-10 LAB — HEMOGLOBIN A1C: Hgb A1c MFr Bld: 6.3 % (ref 4.6–6.5)

## 2024-02-11 ENCOUNTER — Other Ambulatory Visit: Payer: Self-pay | Admitting: Family Medicine

## 2024-02-11 ENCOUNTER — Ambulatory Visit: Payer: Self-pay | Admitting: Family

## 2024-02-11 DIAGNOSIS — E1165 Type 2 diabetes mellitus with hyperglycemia: Secondary | ICD-10-CM

## 2024-02-24 ENCOUNTER — Ambulatory Visit: Admitting: Psychology

## 2024-02-24 DIAGNOSIS — F4323 Adjustment disorder with mixed anxiety and depressed mood: Secondary | ICD-10-CM

## 2024-02-24 NOTE — Progress Notes (Signed)
 [facility] Counselor/Therapist Progress Note  Patient ID: Jasmine Hinton, MRN: [id number],    Date: 02/24/2024  Time Spent: 12:00pm-12:55pm   55 minutes   Treatment Type: Individual Therapy  Reported Symptoms: stress  Mental Status Exam: Appearance:  Casual     Behavior: Appropriate  Motor: Normal  Speech/Language:  Normal Rate  Affect: Appropriate  Mood: normal  Thought process: normal  Thought content:   WNL  Sensory/Perceptual disturbances:   WNL  Orientation: oriented to person, place, time/date, and situation  Attention: Good  Concentration: Good  Memory: WNL  Fund of knowledge:  Good  Insight:   Good  Judgment:  Good  Impulse Control: Good   Risk Assessment: Danger to Self:  No Self-injurious Behavior: No Danger to Others: No Duty to Warn:no Physical Aggression / Violence:No  Access to Firearms a concern: No  Gang Involvement:No   Subjective: Pt present for face-to-face individual therapy via video.  Pt consents to telehealth video session and is aware of limitations and benefits of virtual sessions.  Location of pt: home Location of therapist: home office.     Pt talked about continuing to grieve the loss of her grandfather.  Helped pt process her feelings and grief. Pt talked about her family.   Pt's sister wants their mother and grandmother to go to therapy and then wants all of them to have family therapy.   Pt wants to go but is afraid of what will happen.   Addressed pt's fears and how she can communicate her concerns in family therapy.   Pt states her mother is very critical of her.  Pt is also put in the position to be mediator between her mother and grandmother who do not get along.  This is stressful for pt and she wants to work on setting healthy boundaries.  Helped pt process family dynamics.   Pt states her sister has always been the preferred child.   Pt tries not to let that bother her but feels very hurt at times.   Worked on self  care strategies.   Provided supportive therapy.   Interventions: Cognitive Behavioral Therapy and Insight-Oriented  Diagnosis: F43.23  Plan of Care: Recommend ongoing therapy.   Pt participated in setting treatment goals.   Plan to meet monthly.  Pt agrees with treatment plan.  Treatment Plan    Client Abilities/Strengths   Pt is bright, engaging, and motivated for therapy.  Client Treatment Preferences   Individual therapy.  Client Statement of Needs   Improve copings skills and have a safe place to talk about issues.   Symptoms   Depressed or irritable mood. Excessive and/or unrealistic worry that is difficult to control occurring more days than not for at least 6 months about a number of events or activities.  Problems Addressed   Unipolar Depression, Anxiety Goals  1. Alleviate depressive symptoms and return to previous level of effective functioning.  2. Appropriately grieve the loss in order to normalize mood and to return to previously adaptive level of functioning.  Objective  Learn and implement behavioral strategies to overcome depression. Target Date: 2024-08-17 Frequency: monthly  Progress: 55 Modality: individual  Related Interventions  Assist the client in developing skills that increase the likelihood of deriving pleasure from behavioral activation (e.g., assertiveness skills, developing an exercise plan, less internal/more external focus, increased social involvement); reinforce success. Engage the client in behavioral activation, increasing his/her activity level and contact with sources of reward, while identifying processes that inhibit activation.  use behavioral techniques such as instruction, rehearsal, role-playing, role reversal, as needed, to facilitate activity in the client's daily life; reinforce success. 3. Develop healthy interpersonal relationships that lead to the alleviation and help prevent the relapse of depression.  4. Develop healthy  thinking patterns and beliefs about self, others, and the world that lead to the alleviation and help prevent the relapse of depression.  5. Enhance ability to effectively cope with the full variety of life's worries and anxieties.  6. Learn and implement coping skills that result in a reduction of anxiety and worry, and improved daily functioning.  Objective  Learn and implement problem-solving strategies for realistically addressing worries. Target Date: 2024-08-17 Frequency: monthly  Progress: 55 Modality: individual  Related Interventions  Assign the client a homework exercise in which he/she problem-solves a current problem (see Mastery of Your Anxiety and Worry: Workbook by Richarda and Jonne or Generalized Anxiety Disorder by Delores Filler, and Jonne); review, reinforce success, and provide corrective feedback toward improvement. Teach the client problem-solving strategies involving specifically defining a problem, generating options for addressing it, evaluating the pros and cons of each option, selecting and implementing an optional action, and reevaluating and refining the action. Objective  Learn and implement calming skills to reduce overall anxiety and manage anxiety symptoms. Target Date: 2024-08-17 Frequency: monthly  Progress: 55 Modality: individual  Related Interventions  Assign the client to read about progressive muscle relaxation and other calming strategies in relevant books or treatment manuals (e.g., Progressive Relaxation Training by Thornell armin Collier; Mastery of Your Anxiety and Worry: Workbook by Richarda armin Jonne). Assign the client homework each session in which he/she practices relaxation exercises daily, gradually applying them progressively from non-anxiety-provoking to anxiety-provoking situations; review and reinforce success while providing corrective feedback toward improvement. Teach the client calming/relaxation skills (e.g., applied relaxation,  progressive muscle relaxation, cue controlled relaxation; mindful breathing; biofeedback) and how to discriminate better between relaxation and tension; teach the client how to apply these skills to his/her daily life. 7. Recognize, accept, and cope with feelings of depression.  8. Reduce overall frequency, intensity, and duration of the anxiety so that daily functioning is not impaired.  9. Resolve the core conflict that is the source of anxiety.  10. Stabilize anxiety level while increasing ability to function on a daily basis.  Diagnosis    F43.23   Conditions For Discharge  Achievement of treatment goals and objectives   Veva Alma, LCSW

## 2024-03-21 ENCOUNTER — Other Ambulatory Visit: Payer: Self-pay | Admitting: Family

## 2024-03-21 DIAGNOSIS — E1165 Type 2 diabetes mellitus with hyperglycemia: Secondary | ICD-10-CM

## 2024-03-29 ENCOUNTER — Ambulatory Visit: Admitting: Psychology

## 2024-03-30 ENCOUNTER — Other Ambulatory Visit: Payer: Self-pay | Admitting: Family Medicine

## 2024-03-30 DIAGNOSIS — E1165 Type 2 diabetes mellitus with hyperglycemia: Secondary | ICD-10-CM

## 2024-03-30 MED ORDER — ZEPBOUND 2.5 MG/0.5ML ~~LOC~~ SOLN: 2.5000 mg | SUBCUTANEOUS | 0 refills | Status: DC

## 2024-03-30 NOTE — Telephone Encounter (Signed)
 Copied from CRM [id number]. Topic: Clinical - Medication Refill >> Mar 30, 2024  9:33 AM [place] M wrote: Medication: ZEPBOUND  2.5 MG/0.5ML injection vial  Has the patient contacted their pharmacy? Yes (Agent: If no, request that the patient contact the pharmacy for the refill. If patient does not wish to contact the pharmacy document the reason why and proceed with request.) (Agent: If yes, when and what did the pharmacy advise?)  This is the patient's preferred pharmacy:   [facility] [place], [place] - [phone] [place] [address] Phone: [phone] Fax: [phone]  Is this the correct pharmacy for this prescription? Yes If no, delete pharmacy and type the correct one.   Has the prescription been filled recently? Yes  Is the patient out of the medication? Yes  Has the patient been seen for an appointment in the last year OR does the patient have an upcoming appointment? Yes  Can we respond through MyChart? Yes  Agent: Please be advised that Rx refills may take up to 3 business days. We ask that you follow-up with your pharmacy.

## 2024-04-05 ENCOUNTER — Other Ambulatory Visit: Payer: Self-pay | Admitting: Family

## 2024-04-05 DIAGNOSIS — E1169 Type 2 diabetes mellitus with other specified complication: Secondary | ICD-10-CM

## 2024-04-06 ENCOUNTER — Other Ambulatory Visit: Payer: Self-pay | Admitting: Family

## 2024-04-06 DIAGNOSIS — E1169 Type 2 diabetes mellitus with other specified complication: Secondary | ICD-10-CM

## 2024-04-25 ENCOUNTER — Other Ambulatory Visit: Payer: Self-pay | Admitting: Family Medicine

## 2024-04-25 DIAGNOSIS — E1165 Type 2 diabetes mellitus with hyperglycemia: Secondary | ICD-10-CM

## 2024-04-26 ENCOUNTER — Ambulatory Visit: Admitting: Psychology

## 2024-04-26 DIAGNOSIS — F4323 Adjustment disorder with mixed anxiety and depressed mood: Secondary | ICD-10-CM | POA: Diagnosis not present

## 2024-04-26 NOTE — Progress Notes (Signed)
 [facility] Counselor/Therapist Progress Note  Patient ID: AIRICA SCHWARTZKOPF, MRN: [id number],    Date: 04/26/2024  Time Spent: 12:00pm-12:55pm   55 minutes   Treatment Type: Individual Therapy  Reported Symptoms: stress  Mental Status Exam: Appearance:  Casual     Behavior: Appropriate  Motor: Normal  Speech/Language:  Normal Rate  Affect: Appropriate  Mood: normal  Thought process: normal  Thought content:   WNL  Sensory/Perceptual disturbances:   WNL  Orientation: oriented to person, place, time/date, and situation  Attention: Good  Concentration: Good  Memory: WNL  Fund of knowledge:  Good  Insight:   Good  Judgment:  Good  Impulse Control: Good   Risk Assessment: Danger to Self:  No Self-injurious Behavior: No Danger to Others: No Duty to Warn:no Physical Aggression / Violence:No  Access to Firearms a concern: No  Gang Involvement:No   Subjective: Pt present for face-to-face individual therapy via video.  Pt consents to telehealth video session and is aware of limitations and benefits of virtual sessions.  Location of pt: home Location of therapist: home office.     Pt talked about going on a day trip with her mother and grandmother.  Pt's mother said some things that hurt pt's feelings.   Pt reacted to her mother and they had a challenging interaction.  Addressed the interaction and how it impacted pt.   Helped pt process her feelings and relationship dynamics.  Worked with pt on how to not be as reactive to her mother.  Worked on healthy boundary setting.   Pt states she is terrified that her mother will want pt to be her constant companion once pt's grandmother passes.   Pt does not want to fulfill that role.   Pt does not want to become her mother's full time care giver as she ages.  Addressed that pt has choice in that decision.   Worked on self care strategies.   Provided supportive therapy.   Interventions: Cognitive Behavioral Therapy and  Insight-Oriented  Diagnosis: F43.23  Plan of Care: Recommend ongoing therapy.   Pt participated in setting treatment goals.   Plan to meet monthly.  Pt agrees with treatment plan.  Treatment Plan    Client Abilities/Strengths   Pt is bright, engaging, and motivated for therapy.  Client Treatment Preferences   Individual therapy.  Client Statement of Needs   Improve copings skills and have a safe place to talk about issues.   Symptoms   Depressed or irritable mood. Excessive and/or unrealistic worry that is difficult to control occurring more days than not for at least 6 months about a number of events or activities.  Problems Addressed   Unipolar Depression, Anxiety Goals  1. Alleviate depressive symptoms and return to previous level of effective functioning.  2. Appropriately grieve the loss in order to normalize mood and to return to previously adaptive level of functioning.  Objective  Learn and implement behavioral strategies to overcome depression. Target Date: 2024-08-17 Frequency: monthly  Progress: 55 Modality: individual  Related Interventions  Assist the client in developing skills that increase the likelihood of deriving pleasure from behavioral activation (e.g., assertiveness skills, developing an exercise plan, less internal/more external focus, increased social involvement); reinforce success. Engage the client in behavioral activation, increasing his/her activity level and contact with sources of reward, while identifying processes that inhibit activation. use behavioral techniques such as instruction, rehearsal, role-playing, role reversal, as needed, to facilitate activity in the client's daily life; reinforce success. 3.  Develop healthy interpersonal relationships that lead to the alleviation and help prevent the relapse of depression.  4. Develop healthy thinking patterns and beliefs about self, others, and the world that lead to the alleviation and help  prevent the relapse of depression.  5. Enhance ability to effectively cope with the full variety of life's worries and anxieties.  6. Learn and implement coping skills that result in a reduction of anxiety and worry, and improved daily functioning.  Objective  Learn and implement problem-solving strategies for realistically addressing worries. Target Date: 2024-08-17 Frequency: monthly  Progress: 55 Modality: individual  Related Interventions  Assign the client a homework exercise in which he/she problem-solves a current problem (see Mastery of Your Anxiety and Worry: Workbook by Richarda and Jonne or Generalized Anxiety Disorder by Delores Filler, and Jonne); review, reinforce success, and provide corrective feedback toward improvement. Teach the client problem-solving strategies involving specifically defining a problem, generating options for addressing it, evaluating the pros and cons of each option, selecting and implementing an optional action, and reevaluating and refining the action. Objective  Learn and implement calming skills to reduce overall anxiety and manage anxiety symptoms. Target Date: 2024-08-17 Frequency: monthly  Progress: 55 Modality: individual  Related Interventions  Assign the client to read about progressive muscle relaxation and other calming strategies in relevant books or treatment manuals (e.g., Progressive Relaxation Training by Thornell armin Collier; Mastery of Your Anxiety and Worry: Workbook by Richarda armin Jonne). Assign the client homework each session in which he/she practices relaxation exercises daily, gradually applying them progressively from non-anxiety-provoking to anxiety-provoking situations; review and reinforce success while providing corrective feedback toward improvement. Teach the client calming/relaxation skills (e.g., applied relaxation, progressive muscle relaxation, cue controlled relaxation; mindful breathing; biofeedback) and how to  discriminate better between relaxation and tension; teach the client how to apply these skills to his/her daily life. 7. Recognize, accept, and cope with feelings of depression.  8. Reduce overall frequency, intensity, and duration of the anxiety so that daily functioning is not impaired.  9. Resolve the core conflict that is the source of anxiety.  10. Stabilize anxiety level while increasing ability to function on a daily basis.  Diagnosis    F43.23   Conditions For Discharge  Achievement of treatment goals and objectives   Veva Alma, LCSW

## 2024-05-23 ENCOUNTER — Other Ambulatory Visit: Payer: Self-pay | Admitting: Family Medicine

## 2024-05-23 DIAGNOSIS — E1165 Type 2 diabetes mellitus with hyperglycemia: Secondary | ICD-10-CM

## 2024-05-24 ENCOUNTER — Ambulatory Visit: Admitting: Psychology

## 2024-05-24 DIAGNOSIS — F4323 Adjustment disorder with mixed anxiety and depressed mood: Secondary | ICD-10-CM

## 2024-05-24 NOTE — Progress Notes (Signed)
 [facility] Counselor/Therapist Progress Note  Patient ID: ARIONA DESCHENE, MRN: [id number],    Date: 05/24/2024  Time Spent: 12:00pm-12:55pm   55 minutes   Treatment Type: Individual Therapy  Reported Symptoms: stress  Mental Status Exam: Appearance:  Casual     Behavior: Appropriate  Motor: Normal  Speech/Language:  Normal Rate  Affect: Appropriate  Mood: normal  Thought process: normal  Thought content:   WNL  Sensory/Perceptual disturbances:   WNL  Orientation: oriented to person, place, time/date, and situation  Attention: Good  Concentration: Good  Memory: WNL  Fund of knowledge:  Good  Insight:   Good  Judgment:  Good  Impulse Control: Good   Risk Assessment: Danger to Self:  No Self-injurious Behavior: No Danger to Others: No Duty to Warn:no Physical Aggression / Violence:No  Access to Firearms a concern: No  Gang Involvement:No   Subjective: Pt present for face-to-face individual therapy via video.  Pt consents to telehealth video session and is aware of limitations and benefits of virtual sessions.  Location of pt: home Location of therapist: home office.     Pt talked about work.  It is the busy time of year bc of the holidays.  Addressed pt's work stress.  Worked on optician, dispensing.  Pt states the holidays are her least favorite time of year bc of the expectations from family.  Pt is tired bc of work and then does not feel like traveling.   Pt states she has some resentment toward her mother that she is having trouble getting past.  Pt feels resentment about her mother pressuring pt to spend time with her.  Her mother thinks pt spends too much time with her friends and wants pt to spend more time with her.  Pt's mother does not have a lot of friends.  Pt does not want to hang out with her mother all the time.  Pt has not talked to her mother about how she feels.  Worked with pt on how she can communicate her needs to her mother.  Helped pt  process her feelings and relationship dynamics.  Pt talked about being in perimenopause.  She is having hot flashes and mood lability.  Pt has a good support group of friends who are also going through perimenopause.   Worked on self care strategies.   Provided supportive therapy.   Interventions: Cognitive Behavioral Therapy and Insight-Oriented  Diagnosis: F43.23  Plan of Care: Recommend ongoing therapy.   Pt participated in setting treatment goals.   Plan to meet monthly.  Pt agrees with treatment plan.  Treatment Plan    Client Abilities/Strengths   Pt is bright, engaging, and motivated for therapy.  Client Treatment Preferences   Individual therapy.  Client Statement of Needs   Improve copings skills and have a safe place to talk about issues.   Symptoms   Depressed or irritable mood. Excessive and/or unrealistic worry that is difficult to control occurring more days than not for at least 6 months about a number of events or activities.  Problems Addressed   Unipolar Depression, Anxiety Goals  1. Alleviate depressive symptoms and return to previous level of effective functioning.  2. Appropriately grieve the loss in order to normalize mood and to return to previously adaptive level of functioning.  Objective  Learn and implement behavioral strategies to overcome depression. Target Date: 2024-08-17 Frequency: monthly  Progress: 55 Modality: individual  Related Interventions  Assist the client in developing skills that increase the  likelihood of deriving pleasure from behavioral activation (e.g., assertiveness skills, developing an exercise plan, less internal/more external focus, increased social involvement); reinforce success. Engage the client in behavioral activation, increasing his/her activity level and contact with sources of reward, while identifying processes that inhibit activation. use behavioral techniques such as instruction, rehearsal, role-playing, role  reversal, as needed, to facilitate activity in the client's daily life; reinforce success. 3. Develop healthy interpersonal relationships that lead to the alleviation and help prevent the relapse of depression.  4. Develop healthy thinking patterns and beliefs about self, others, and the world that lead to the alleviation and help prevent the relapse of depression.  5. Enhance ability to effectively cope with the full variety of life's worries and anxieties.  6. Learn and implement coping skills that result in a reduction of anxiety and worry, and improved daily functioning.  Objective  Learn and implement problem-solving strategies for realistically addressing worries. Target Date: 2024-08-17 Frequency: monthly  Progress: 55 Modality: individual  Related Interventions  Assign the client a homework exercise in which he/she problem-solves a current problem (see Mastery of Your Anxiety and Worry: Workbook by Richarda and Jonne or Generalized Anxiety Disorder by Delores Filler, and Jonne); review, reinforce success, and provide corrective feedback toward improvement. Teach the client problem-solving strategies involving specifically defining a problem, generating options for addressing it, evaluating the pros and cons of each option, selecting and implementing an optional action, and reevaluating and refining the action. Objective  Learn and implement calming skills to reduce overall anxiety and manage anxiety symptoms. Target Date: 2024-08-17 Frequency: monthly  Progress: 55 Modality: individual  Related Interventions  Assign the client to read about progressive muscle relaxation and other calming strategies in relevant books or treatment manuals (e.g., Progressive Relaxation Training by Thornell armin Collier; Mastery of Your Anxiety and Worry: Workbook by Richarda armin Jonne). Assign the client homework each session in which he/she practices relaxation exercises daily, gradually applying them  progressively from non-anxiety-provoking to anxiety-provoking situations; review and reinforce success while providing corrective feedback toward improvement. Teach the client calming/relaxation skills (e.g., applied relaxation, progressive muscle relaxation, cue controlled relaxation; mindful breathing; biofeedback) and how to discriminate better between relaxation and tension; teach the client how to apply these skills to his/her daily life. 7. Recognize, accept, and cope with feelings of depression.  8. Reduce overall frequency, intensity, and duration of the anxiety so that daily functioning is not impaired.  9. Resolve the core conflict that is the source of anxiety.  10. Stabilize anxiety level while increasing ability to function on a daily basis.  Diagnosis    F43.23   Conditions For Discharge  Achievement of treatment goals and objectives   Veva Alma, LCSW

## 2024-06-21 ENCOUNTER — Ambulatory Visit: Admitting: Psychology

## 2024-06-21 ENCOUNTER — Other Ambulatory Visit: Payer: Self-pay | Admitting: Family Medicine

## 2024-06-21 DIAGNOSIS — F418 Other specified anxiety disorders: Secondary | ICD-10-CM

## 2024-06-21 DIAGNOSIS — F4323 Adjustment disorder with mixed anxiety and depressed mood: Secondary | ICD-10-CM

## 2024-06-21 NOTE — Progress Notes (Signed)
 [facility] Counselor/Therapist Progress Note  Patient ID: JAQUANDA WICKERSHAM, MRN: [id number],    Date: 06/21/2024  Time Spent: 12:00pm-12:55pm   55 minutes   Treatment Type: Individual Therapy  Reported Symptoms: stress  Mental Status Exam: Appearance:  Casual     Behavior: Appropriate  Motor: Normal  Speech/Language:  Normal Rate  Affect: Appropriate  Mood: normal  Thought process: normal  Thought content:   WNL  Sensory/Perceptual disturbances:   WNL  Orientation: oriented to person, place, time/date, and situation  Attention: Good  Concentration: Good  Memory: WNL  Fund of knowledge:  Good  Insight:   Good  Judgment:  Good  Impulse Control: Good   Risk Assessment: Danger to Self:  No Self-injurious Behavior: No Danger to Others: No Duty to Warn:no Physical Aggression / Violence:No  Access to Firearms a concern: No  Gang Involvement:No   Subjective: Pt present for face-to-face individual therapy via video.  Pt consents to telehealth video session and is aware of limitations and benefits of virtual sessions.  Location of pt: home Location of therapist: home office.     Pt talked about work.  It is the busy time of year bc of the holidays.  Addressed pt's work stress.  Worked on optician, dispensing.  Pt talked about her relationship with her mother.  The holidays make the family dynamics more complex.  Pt's mother expects pt to visit her and nobody else. Pt wants to also spend time with other relatives but her mother gets upset about that.  Pt has been trying to negotiate Christmas plans with her mother and feels her mother is not being reasonable.   Pt talked about her relationship with her father.  She is disappointed that her father does not follow through with contacting her.  It reminds her of her childhood when her father said he was going to visit but never showed up.  Addressed how this impacted pt.   Helped pt process her feelings and family dynamics.   Pt states it is easier to draw a boundary with her father than with her mother.   Pt feels burdened by the unrealistic expectations her mother puts on her.  Worked on self care strategies.   Provided supportive therapy.   Interventions: Cognitive Behavioral Therapy and Insight-Oriented  Diagnosis: F43.23  Plan of Care: Recommend ongoing therapy.   Pt participated in setting treatment goals.   Plan to meet monthly.  Pt agrees with treatment plan.  Treatment Plan    Client Abilities/Strengths   Pt is bright, engaging, and motivated for therapy.  Client Treatment Preferences   Individual therapy.  Client Statement of Needs   Improve copings skills and have a safe place to talk about issues.   Symptoms   Depressed or irritable mood. Excessive and/or unrealistic worry that is difficult to control occurring more days than not for at least 6 months about a number of events or activities.  Problems Addressed   Unipolar Depression, Anxiety Goals  1. Alleviate depressive symptoms and return to previous level of effective functioning.  2. Appropriately grieve the loss in order to normalize mood and to return to previously adaptive level of functioning.  Objective  Learn and implement behavioral strategies to overcome depression. Target Date: 2024-08-17 Frequency: monthly  Progress: 55 Modality: individual  Related Interventions  Assist the client in developing skills that increase the likelihood of deriving pleasure from behavioral activation (e.g., assertiveness skills, developing an exercise plan, less internal/more external focus, increased social  involvement); reinforce success. Engage the client in behavioral activation, increasing his/her activity level and contact with sources of reward, while identifying processes that inhibit activation. use behavioral techniques such as instruction, rehearsal, role-playing, role reversal, as needed, to facilitate activity in the client's  daily life; reinforce success. 3. Develop healthy interpersonal relationships that lead to the alleviation and help prevent the relapse of depression.  4. Develop healthy thinking patterns and beliefs about self, others, and the world that lead to the alleviation and help prevent the relapse of depression.  5. Enhance ability to effectively cope with the full variety of life's worries and anxieties.  6. Learn and implement coping skills that result in a reduction of anxiety and worry, and improved daily functioning.  Objective  Learn and implement problem-solving strategies for realistically addressing worries. Target Date: 2024-08-17 Frequency: monthly  Progress: 55 Modality: individual  Related Interventions  Assign the client a homework exercise in which he/she problem-solves a current problem (see Mastery of Your Anxiety and Worry: Workbook by Richarda and Jonne or Generalized Anxiety Disorder by Delores Filler, and Jonne); review, reinforce success, and provide corrective feedback toward improvement. Teach the client problem-solving strategies involving specifically defining a problem, generating options for addressing it, evaluating the pros and cons of each option, selecting and implementing an optional action, and reevaluating and refining the action. Objective  Learn and implement calming skills to reduce overall anxiety and manage anxiety symptoms. Target Date: 2024-08-17 Frequency: monthly  Progress: 55 Modality: individual  Related Interventions  Assign the client to read about progressive muscle relaxation and other calming strategies in relevant books or treatment manuals (e.g., Progressive Relaxation Training by Thornell armin Collier; Mastery of Your Anxiety and Worry: Workbook by Richarda armin Jonne). Assign the client homework each session in which he/she practices relaxation exercises daily, gradually applying them progressively from non-anxiety-provoking to  anxiety-provoking situations; review and reinforce success while providing corrective feedback toward improvement. Teach the client calming/relaxation skills (e.g., applied relaxation, progressive muscle relaxation, cue controlled relaxation; mindful breathing; biofeedback) and how to discriminate better between relaxation and tension; teach the client how to apply these skills to his/her daily life. 7. Recognize, accept, and cope with feelings of depression.  8. Reduce overall frequency, intensity, and duration of the anxiety so that daily functioning is not impaired.  9. Resolve the core conflict that is the source of anxiety.  10. Stabilize anxiety level while increasing ability to function on a daily basis.  Diagnosis    F43.23   Conditions For Discharge  Achievement of treatment goals and objectives   Veva Alma, LCSW

## 2024-06-30 ENCOUNTER — Other Ambulatory Visit: Payer: Self-pay | Admitting: Family Medicine

## 2024-06-30 DIAGNOSIS — E1165 Type 2 diabetes mellitus with hyperglycemia: Secondary | ICD-10-CM

## 2024-07-01 ENCOUNTER — Other Ambulatory Visit: Payer: Self-pay | Admitting: Family Medicine

## 2024-07-01 DIAGNOSIS — E1165 Type 2 diabetes mellitus with hyperglycemia: Secondary | ICD-10-CM

## 2024-07-02 ENCOUNTER — Other Ambulatory Visit: Payer: Self-pay | Admitting: Family Medicine

## 2024-07-02 DIAGNOSIS — E1165 Type 2 diabetes mellitus with hyperglycemia: Secondary | ICD-10-CM

## 2024-07-02 DIAGNOSIS — I10 Essential (primary) hypertension: Secondary | ICD-10-CM

## 2024-07-08 ENCOUNTER — Other Ambulatory Visit: Payer: Self-pay | Admitting: Family Medicine

## 2024-07-08 DIAGNOSIS — E1169 Type 2 diabetes mellitus with other specified complication: Secondary | ICD-10-CM

## 2024-07-19 ENCOUNTER — Ambulatory Visit: Admitting: Psychology

## 2024-07-19 DIAGNOSIS — F4323 Adjustment disorder with mixed anxiety and depressed mood: Secondary | ICD-10-CM | POA: Diagnosis not present

## 2024-07-19 NOTE — Progress Notes (Signed)
 "  [facility] Counselor/Therapist Progress Note  Patient ID: CHANIN FRUMKIN, MRN: [id number],    Date: 07/19/2024  Time Spent: 12:00pm-12:55pm   55 minutes   Treatment Type: Individual Therapy  Reported Symptoms: stress  Mental Status Exam: Appearance:  Casual     Behavior: Appropriate  Motor: Normal  Speech/Language:  Normal Rate  Affect: Appropriate  Mood: normal  Thought process: normal  Thought content:   WNL  Sensory/Perceptual disturbances:   WNL  Orientation: oriented to person, place, time/date, and situation  Attention: Good  Concentration: Good  Memory: WNL  Fund of knowledge:  Good  Insight:   Good  Judgment:  Good  Impulse Control: Good   Risk Assessment: Danger to Self:  No Self-injurious Behavior: No Danger to Others: No Duty to Warn:no Physical Aggression / Violence:No  Access to Firearms a concern: No  Gang Involvement:No   Subjective: Pt present for face-to-face individual therapy via video.  Pt consents to telehealth video session and is aware of limitations and benefits of virtual sessions.  Location of pt: home Location of therapist: home office.     Pt talked about the holidays.   She had good holidays bc she visited her step father's side of the family and had a good visit.  Pt also spent some time with her mother and it was not as stressful as usual. Pt visited her father which was not as enjoyable.  He tends to pressure her to be closer to him and his family even though he did not spend time with pt growing up.  Addressed the relationship dynamics and helped pt process her feelings.   Pt is planning fun things to do with her husband in the new year.  Work is not as stressful not that the holidays are over.   Worked on self care strategies.   Provided supportive therapy.   Interventions: Cognitive Behavioral Therapy and Insight-Oriented  Diagnosis: F43.23  Plan of Care: Recommend ongoing therapy.   Pt participated in setting  treatment goals.   Plan to meet monthly.  Pt agrees with treatment plan.  Treatment Plan    Client Abilities/Strengths   Pt is bright, engaging, and motivated for therapy.  Client Treatment Preferences   Individual therapy.  Client Statement of Needs   Improve copings skills and have a safe place to talk about issues.   Symptoms   Depressed or irritable mood. Excessive and/or unrealistic worry that is difficult to control occurring more days than not for at least 6 months about a number of events or activities.  Problems Addressed   Unipolar Depression, Anxiety Goals  1. Alleviate depressive symptoms and return to previous level of effective functioning.  2. Appropriately grieve the loss in order to normalize mood and to return to previously adaptive level of functioning.  Objective  Learn and implement behavioral strategies to overcome depression. Target Date: 2024-08-17 Frequency: monthly  Progress: 55 Modality: individual  Related Interventions  Assist the client in developing skills that increase the likelihood of deriving pleasure from behavioral activation (e.g., assertiveness skills, developing an exercise plan, less internal/more external focus, increased social involvement); reinforce success. Engage the client in behavioral activation, increasing his/her activity level and contact with sources of reward, while identifying processes that inhibit activation. use behavioral techniques such as instruction, rehearsal, role-playing, role reversal, as needed, to facilitate activity in the client's daily life; reinforce success. 3. Develop healthy interpersonal relationships that lead to the alleviation and help prevent the relapse of  depression.  4. Develop healthy thinking patterns and beliefs about self, others, and the world that lead to the alleviation and help prevent the relapse of depression.  5. Enhance ability to effectively cope with the full variety of life's  worries and anxieties.  6. Learn and implement coping skills that result in a reduction of anxiety and worry, and improved daily functioning.  Objective  Learn and implement problem-solving strategies for realistically addressing worries. Target Date: 2024-08-17 Frequency: monthly  Progress: 55 Modality: individual  Related Interventions  Assign the client a homework exercise in which he/she problem-solves a current problem (see Mastery of Your Anxiety and Worry: Workbook by Richarda and Jonne or Generalized Anxiety Disorder by Delores Filler, and Jonne); review, reinforce success, and provide corrective feedback toward improvement. Teach the client problem-solving strategies involving specifically defining a problem, generating options for addressing it, evaluating the pros and cons of each option, selecting and implementing an optional action, and reevaluating and refining the action. Objective  Learn and implement calming skills to reduce overall anxiety and manage anxiety symptoms. Target Date: 2024-08-17 Frequency: monthly  Progress: 55 Modality: individual  Related Interventions  Assign the client to read about progressive muscle relaxation and other calming strategies in relevant books or treatment manuals (e.g., Progressive Relaxation Training by Thornell armin Collier; Mastery of Your Anxiety and Worry: Workbook by Richarda armin Jonne). Assign the client homework each session in which he/she practices relaxation exercises daily, gradually applying them progressively from non-anxiety-provoking to anxiety-provoking situations; review and reinforce success while providing corrective feedback toward improvement. Teach the client calming/relaxation skills (e.g., applied relaxation, progressive muscle relaxation, cue controlled relaxation; mindful breathing; biofeedback) and how to discriminate better between relaxation and tension; teach the client how to apply these skills to his/her daily  life. 7. Recognize, accept, and cope with feelings of depression.  8. Reduce overall frequency, intensity, and duration of the anxiety so that daily functioning is not impaired.  9. Resolve the core conflict that is the source of anxiety.  10. Stabilize anxiety level while increasing ability to function on a daily basis.  Diagnosis    F43.23   Conditions For Discharge  Achievement of treatment goals and objectives   Veva Alma, LCSW   "

## 2024-07-28 ENCOUNTER — Other Ambulatory Visit: Payer: Self-pay | Admitting: Family Medicine

## 2024-07-28 DIAGNOSIS — E1165 Type 2 diabetes mellitus with hyperglycemia: Secondary | ICD-10-CM

## 2024-07-28 NOTE — Telephone Encounter (Signed)
 Copied from CRM [id number]. Topic: Clinical - Medication Refill >> Jul 28, 2024 10:33 AM Alfonso HERO wrote: Medication:  ZEPBOUND  2.5 MG/0.5ML injection vial    Has the patient contacted their pharmacy? Yes (Agent: If no, request that the patient contact the pharmacy for the refill. If patient does not wish to contact the pharmacy document the reason why and proceed with request.) (Agent: If yes, when and what did the pharmacy advise?)  This is the patient's preferred pharmacy:   [facility] [place], [place] - [phone] [place] [address] Phone: [phone] Fax: [phone]  Is this the correct pharmacy for this prescription? Yes If no, delete pharmacy and type the correct one.   Has the prescription been filled recently? Yes  Is the patient out of the medication? Yes  Has the patient been seen for an appointment in the last year OR does the patient have an upcoming appointment? Yes  Can we respond through MyChart? Yes  Agent: Please be advised that Rx refills may take up to 3 business days. We ask that you follow-up with your pharmacy.

## 2024-08-04 ENCOUNTER — Other Ambulatory Visit: Payer: Self-pay | Admitting: Family Medicine

## 2024-08-04 DIAGNOSIS — J4520 Mild intermittent asthma, uncomplicated: Secondary | ICD-10-CM

## 2024-08-09 ENCOUNTER — Other Ambulatory Visit: Payer: Self-pay | Admitting: Family Medicine

## 2024-08-09 DIAGNOSIS — E1169 Type 2 diabetes mellitus with other specified complication: Secondary | ICD-10-CM

## 2024-08-12 ENCOUNTER — Other Ambulatory Visit: Payer: Self-pay | Admitting: Family Medicine

## 2024-08-12 ENCOUNTER — Telehealth: Payer: Self-pay | Admitting: Family Medicine

## 2024-08-12 DIAGNOSIS — E1169 Type 2 diabetes mellitus with other specified complication: Secondary | ICD-10-CM

## 2024-08-12 DIAGNOSIS — E1165 Type 2 diabetes mellitus with hyperglycemia: Secondary | ICD-10-CM

## 2024-08-12 MED ORDER — ZEPBOUND 2.5 MG/0.5ML ~~LOC~~ SOLN: 2.5000 mg | SUBCUTANEOUS | 1 refills | Status: AC

## 2024-08-12 MED ORDER — ATORVASTATIN CALCIUM 20 MG PO TABS: 20.0000 mg | ORAL_TABLET | Freq: Every day | ORAL | 0 refills | Status: AC

## 2024-08-12 NOTE — Telephone Encounter (Signed)
 Copied from CRM #[id number]. Topic: Clinical - Medication Question >> Aug 12, 2024 10:37 AM Tonda B wrote: Reason for CRM: patient has question about rx atorvastatin  (LIPITOR) 20 MG  please call pt back

## 2024-08-12 NOTE — Telephone Encounter (Unsigned)
 Copied from CRM #[id number]. Topic: Clinical - Medication Refill >> Aug 12, 2024 10:35 AM Tonda B wrote: Medication: ZEPBOUND  2.5 MG/0.5ML injection vial  Has the patient contacted their pharmacy? Yes (Agent: If no, request that the patient contact the pharmacy for the refill. If patient does not wish to contact the pharmacy document the reason why and proceed with request.) (Agent: If yes, when and what did the pharmacy advise?)  This is the patient's preferred pharmacy:  [facility] [place], [place] - [address] [phone] [place] [address] Phone: [phone] Fax: [phone]  Is this the correct pharmacy for this prescription? Yes If no, delete pharmacy and type the correct one.   Has the prescription been filled recently? Yes  Is the patient out of the medication? Yes  Has the patient been seen for an appointment in the last year OR does the patient have an upcoming appointment? Yes  Can we respond through MyChart? No  Agent: Please be advised that Rx refills may take up to 3 business days. We ask that you follow-up with your pharmacy.

## 2024-08-12 NOTE — Telephone Encounter (Signed)
 Spoke with patient. Pt needed refills on medication until her appointment. Refills sent

## 2024-08-16 ENCOUNTER — Ambulatory Visit: Admitting: Psychology

## 2024-08-16 DIAGNOSIS — F4323 Adjustment disorder with mixed anxiety and depressed mood: Secondary | ICD-10-CM

## 2024-08-16 NOTE — Progress Notes (Signed)
 "  [facility] Counselor/Therapist Progress Note  Patient ID: POCAHONTAS COHENOUR, MRN: [id number],    Date: 08/16/2024  Time Spent: 12:00pm-12:55pm   55 minutes   Treatment Type: Individual Therapy  Reported Symptoms: stress, anxiety  Mental Status Exam: Appearance:  Casual     Behavior: Appropriate  Motor: Normal  Speech/Language:  Normal Rate  Affect: Appropriate  Mood: normal  Thought process: normal  Thought content:   WNL  Sensory/Perceptual disturbances:   WNL  Orientation: oriented to person, place, time/date, and situation  Attention: Good  Concentration: Good  Memory: WNL  Fund of knowledge:  Good  Insight:   Good  Judgment:  Good  Impulse Control: Good   Risk Assessment: Danger to Self:  No Self-injurious Behavior: No Danger to Others: No Duty to Warn:no Physical Aggression / Violence:No  Access to Firearms a concern: No  Gang Involvement:No   Subjective: Pt present for face-to-face individual therapy via video.  Pt consents to telehealth video session and is aware of limitations and benefits of virtual sessions.  Location of pt: home Location of therapist: home office.     Pt talked about having anxiety and trouble sleeping.   She has a pain at the top of her head and her neck is hurting and she is worried about it.  She has worried about the worst case scenario of what if I have a brain tumor.   Pt is anxious about her health in general.  Pt thinks about worst case scenarios.   She does not want to have to be taken care of if she gets sick.  Addressed pt's worry thoughts nad ruminations.  Worked on thought reframing.   She is worried she will gain weight back when she is off the weight loss medication.  Pt does not want to be vain and get caught up in the diet culture.   Helped pt process her thoughts and feelings.   Pt talked about her relationship with her mother.   Her mother is very critical of pt and this bothers her.  Pt tries to set limits on  her mother but it often does not go well.  Addressed the relationship dynamics.  Pt is also worrying about what is going on in the country.  Addressed pt's concerns and fears.  Worked on self care strategies.   Provided supportive therapy.   Interventions: Cognitive Behavioral Therapy and Insight-Oriented  Diagnosis: F43.23  Plan of Care: Recommend ongoing therapy.   Pt participated in setting treatment goals.   Plan to meet monthly.  Pt agrees with treatment plan.  Treatment Plan    Client Abilities/Strengths   Pt is bright, engaging, and motivated for therapy.  Client Treatment Preferences   Individual therapy.  Client Statement of Needs   Improve copings skills and have a safe place to talk about issues.   Symptoms   Depressed or irritable mood. Excessive and/or unrealistic worry that is difficult to control occurring more days than not for at least 6 months about a number of events or activities.  Problems Addressed   Unipolar Depression, Anxiety Goals  1. Alleviate depressive symptoms and return to previous level of effective functioning.  2. Appropriately grieve the loss in order to normalize mood and to return to previously adaptive level of functioning.  Objective  Learn and implement behavioral strategies to overcome depression. Target Date: 2024-08-17 Frequency: monthly  Progress: 55 Modality: individual  Related Interventions  Assist the client in developing skills that increase  the likelihood of deriving pleasure from behavioral activation (e.g., assertiveness skills, developing an exercise plan, less internal/more external focus, increased social involvement); reinforce success. Engage the client in behavioral activation, increasing his/her activity level and contact with sources of reward, while identifying processes that inhibit activation. use behavioral techniques such as instruction, rehearsal, role-playing, role reversal, as needed, to facilitate activity  in the client's daily life; reinforce success. 3. Develop healthy interpersonal relationships that lead to the alleviation and help prevent the relapse of depression.  4. Develop healthy thinking patterns and beliefs about self, others, and the world that lead to the alleviation and help prevent the relapse of depression.  5. Enhance ability to effectively cope with the full variety of life's worries and anxieties.  6. Learn and implement coping skills that result in a reduction of anxiety and worry, and improved daily functioning.  Objective  Learn and implement problem-solving strategies for realistically addressing worries. Target Date: 2024-08-17 Frequency: monthly  Progress: 55 Modality: individual  Related Interventions  Assign the client a homework exercise in which he/she problem-solves a current problem (see Mastery of Your Anxiety and Worry: Workbook by Richarda and Jonne or Generalized Anxiety Disorder by Delores Filler, and Jonne); review, reinforce success, and provide corrective feedback toward improvement. Teach the client problem-solving strategies involving specifically defining a problem, generating options for addressing it, evaluating the pros and cons of each option, selecting and implementing an optional action, and reevaluating and refining the action. Objective  Learn and implement calming skills to reduce overall anxiety and manage anxiety symptoms. Target Date: 2024-08-17 Frequency: monthly  Progress: 55 Modality: individual  Related Interventions  Assign the client to read about progressive muscle relaxation and other calming strategies in relevant books or treatment manuals (e.g., Progressive Relaxation Training by Thornell armin Collier; Mastery of Your Anxiety and Worry: Workbook by Richarda armin Jonne). Assign the client homework each session in which he/she practices relaxation exercises daily, gradually applying them progressively from non-anxiety-provoking to  anxiety-provoking situations; review and reinforce success while providing corrective feedback toward improvement. Teach the client calming/relaxation skills (e.g., applied relaxation, progressive muscle relaxation, cue controlled relaxation; mindful breathing; biofeedback) and how to discriminate better between relaxation and tension; teach the client how to apply these skills to his/her daily life. 7. Recognize, accept, and cope with feelings of depression.  8. Reduce overall frequency, intensity, and duration of the anxiety so that daily functioning is not impaired.  9. Resolve the core conflict that is the source of anxiety.  10. Stabilize anxiety level while increasing ability to function on a daily basis.  Diagnosis    F43.23   Conditions For Discharge  Achievement of treatment goals and objectives   Veva Alma, LCSW    "

## 2024-09-12 ENCOUNTER — Ambulatory Visit: Payer: Self-pay | Admitting: Family Medicine

## 2024-09-13 ENCOUNTER — Ambulatory Visit: Admitting: Psychology

## 2024-10-11 ENCOUNTER — Ambulatory Visit: Admitting: Psychology
